# Patient Record
Sex: Female | Born: 1955 | Race: White | Hispanic: No | Marital: Married | Smoking: Never smoker
Health system: Southern US, Community
[De-identification: ages and names within clinical notes are randomized; demographics above are authoritative.]

## PROBLEM LIST (undated history)

## (undated) DIAGNOSIS — R7303 Prediabetes: Secondary | ICD-10-CM

## (undated) DIAGNOSIS — D649 Anemia, unspecified: Secondary | ICD-10-CM

## (undated) DIAGNOSIS — J45909 Unspecified asthma, uncomplicated: Secondary | ICD-10-CM

## (undated) DIAGNOSIS — Z9109 Other allergy status, other than to drugs and biological substances: Secondary | ICD-10-CM

## (undated) DIAGNOSIS — D1800 Hemangioma unspecified site: Secondary | ICD-10-CM

## (undated) DIAGNOSIS — T7840XA Allergy, unspecified, initial encounter: Secondary | ICD-10-CM

## (undated) DIAGNOSIS — E785 Hyperlipidemia, unspecified: Secondary | ICD-10-CM

## (undated) DIAGNOSIS — K449 Diaphragmatic hernia without obstruction or gangrene: Secondary | ICD-10-CM

## (undated) DIAGNOSIS — K219 Gastro-esophageal reflux disease without esophagitis: Secondary | ICD-10-CM

## (undated) DIAGNOSIS — J069 Acute upper respiratory infection, unspecified: Secondary | ICD-10-CM

## (undated) DIAGNOSIS — R42 Dizziness and giddiness: Secondary | ICD-10-CM

## (undated) DIAGNOSIS — I10 Essential (primary) hypertension: Secondary | ICD-10-CM

## (undated) DIAGNOSIS — M722 Plantar fascial fibromatosis: Secondary | ICD-10-CM

## (undated) DIAGNOSIS — M5126 Other intervertebral disc displacement, lumbar region: Secondary | ICD-10-CM

## (undated) HISTORY — DX: Other intervertebral disc displacement, lumbar region: M51.26

## (undated) HISTORY — DX: Gastro-esophageal reflux disease without esophagitis: K21.9

## (undated) HISTORY — DX: Dizziness and giddiness: R42

## (undated) HISTORY — DX: Hemangioma unspecified site: D18.00

## (undated) HISTORY — PX: CATARACT EXTRACTION: SUR2

## (undated) HISTORY — DX: Prediabetes: R73.03

## (undated) HISTORY — DX: Hyperlipidemia, unspecified: E78.5

## (undated) HISTORY — DX: Plantar fascial fibromatosis: M72.2

## (undated) HISTORY — PX: WISDOM TOOTH EXTRACTION: SHX21

## (undated) HISTORY — DX: Diaphragmatic hernia without obstruction or gangrene: K44.9

## (undated) HISTORY — DX: Acute upper respiratory infection, unspecified: J06.9

## (undated) HISTORY — DX: Essential (primary) hypertension: I10

## (undated) HISTORY — DX: Allergy, unspecified, initial encounter: T78.40XA

## (undated) HISTORY — DX: Anemia, unspecified: D64.9

---

## 1998-07-13 ENCOUNTER — Other Ambulatory Visit: Admission: RE | Admit: 1998-07-13 | Discharge: 1998-07-13 | Payer: Self-pay | Admitting: *Deleted

## 1998-09-16 ENCOUNTER — Other Ambulatory Visit: Admission: RE | Admit: 1998-09-16 | Discharge: 1998-09-16 | Payer: Self-pay | Admitting: *Deleted

## 1998-11-05 HISTORY — PX: ABDOMINAL HYSTERECTOMY: SHX81

## 1998-11-16 ENCOUNTER — Inpatient Hospital Stay (HOSPITAL_COMMUNITY): Admission: RE | Admit: 1998-11-16 | Discharge: 1998-11-18 | Payer: Self-pay | Admitting: *Deleted

## 1999-09-20 ENCOUNTER — Other Ambulatory Visit: Admission: RE | Admit: 1999-09-20 | Discharge: 1999-09-20 | Payer: Self-pay | Admitting: *Deleted

## 2000-10-24 ENCOUNTER — Other Ambulatory Visit: Admission: RE | Admit: 2000-10-24 | Discharge: 2000-10-24 | Payer: Self-pay | Admitting: *Deleted

## 2001-11-18 ENCOUNTER — Other Ambulatory Visit: Admission: RE | Admit: 2001-11-18 | Discharge: 2001-11-18 | Payer: Self-pay | Admitting: *Deleted

## 2001-11-20 ENCOUNTER — Ambulatory Visit (HOSPITAL_COMMUNITY): Admission: RE | Admit: 2001-11-20 | Discharge: 2001-11-20 | Payer: Self-pay | Admitting: Internal Medicine

## 2001-11-20 ENCOUNTER — Encounter: Payer: Self-pay | Admitting: Internal Medicine

## 2003-11-01 ENCOUNTER — Other Ambulatory Visit: Admission: RE | Admit: 2003-11-01 | Discharge: 2003-11-01 | Payer: Self-pay | Admitting: *Deleted

## 2004-12-04 ENCOUNTER — Other Ambulatory Visit: Admission: RE | Admit: 2004-12-04 | Discharge: 2004-12-04 | Payer: Self-pay | Admitting: *Deleted

## 2005-09-06 ENCOUNTER — Ambulatory Visit (HOSPITAL_COMMUNITY): Admission: RE | Admit: 2005-09-06 | Discharge: 2005-09-06 | Payer: Self-pay | Admitting: Internal Medicine

## 2007-02-17 ENCOUNTER — Other Ambulatory Visit: Admission: RE | Admit: 2007-02-17 | Discharge: 2007-02-17 | Payer: Self-pay | Admitting: *Deleted

## 2007-03-11 ENCOUNTER — Ambulatory Visit: Payer: Self-pay | Admitting: Gastroenterology

## 2007-03-21 ENCOUNTER — Ambulatory Visit: Payer: Self-pay | Admitting: Gastroenterology

## 2008-05-06 ENCOUNTER — Other Ambulatory Visit: Admission: RE | Admit: 2008-05-06 | Discharge: 2008-05-06 | Payer: Self-pay | Admitting: Internal Medicine

## 2009-01-15 ENCOUNTER — Emergency Department (HOSPITAL_BASED_OUTPATIENT_CLINIC_OR_DEPARTMENT_OTHER): Admission: EM | Admit: 2009-01-15 | Discharge: 2009-01-15 | Payer: Self-pay | Admitting: Emergency Medicine

## 2009-04-25 ENCOUNTER — Ambulatory Visit (HOSPITAL_COMMUNITY): Admission: RE | Admit: 2009-04-25 | Discharge: 2009-04-25 | Payer: Self-pay | Admitting: Internal Medicine

## 2009-04-25 IMAGING — CR DG LUMBAR SPINE COMPLETE 4+V
6 series · 6 of 6 positions shown · non-contrast
Comparison: None

CLINICAL DATA: Low back pain, no trauma

LUMBAR SPINE - COMPLETE 4+ VIEW

[view not recorded (1 of 6)]
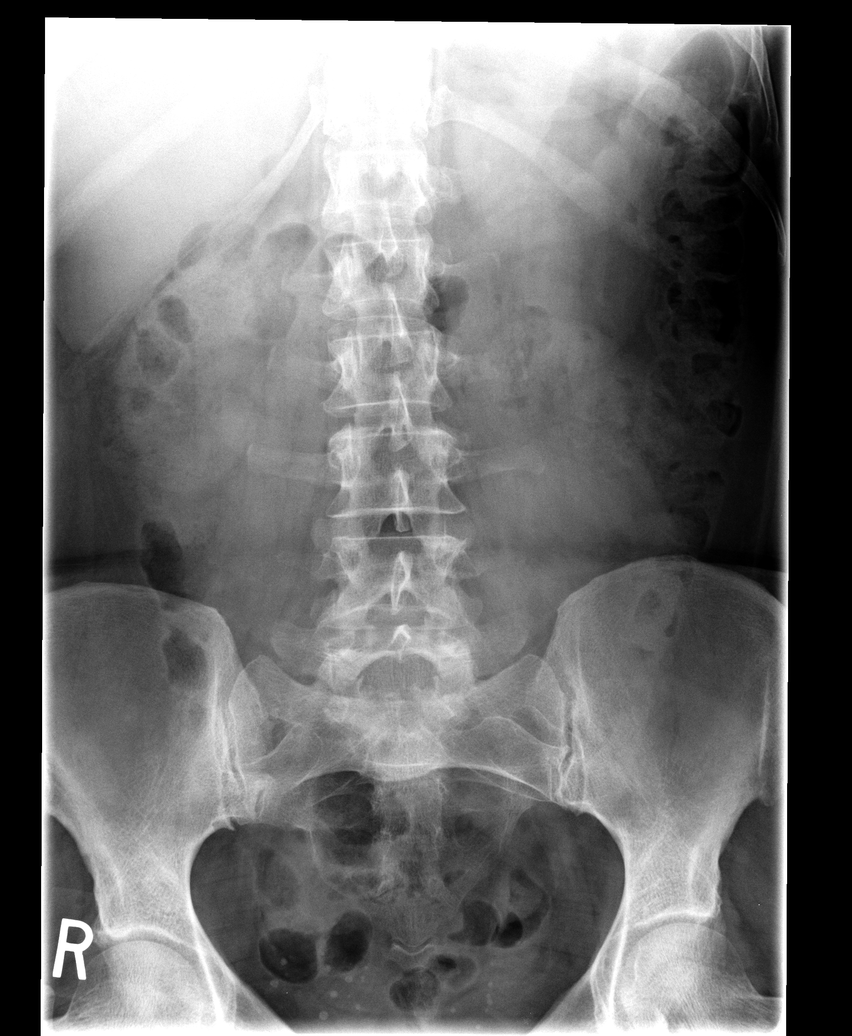

[view not recorded (2 of 6)]
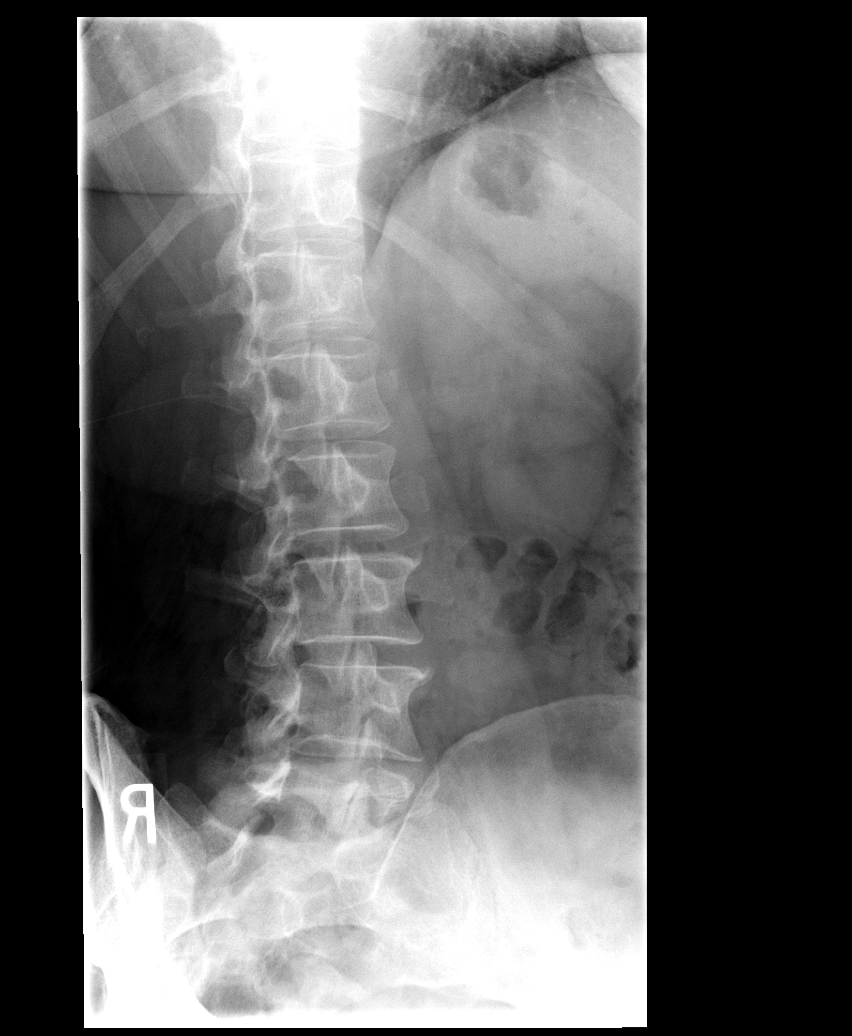

[view not recorded (3 of 6)]
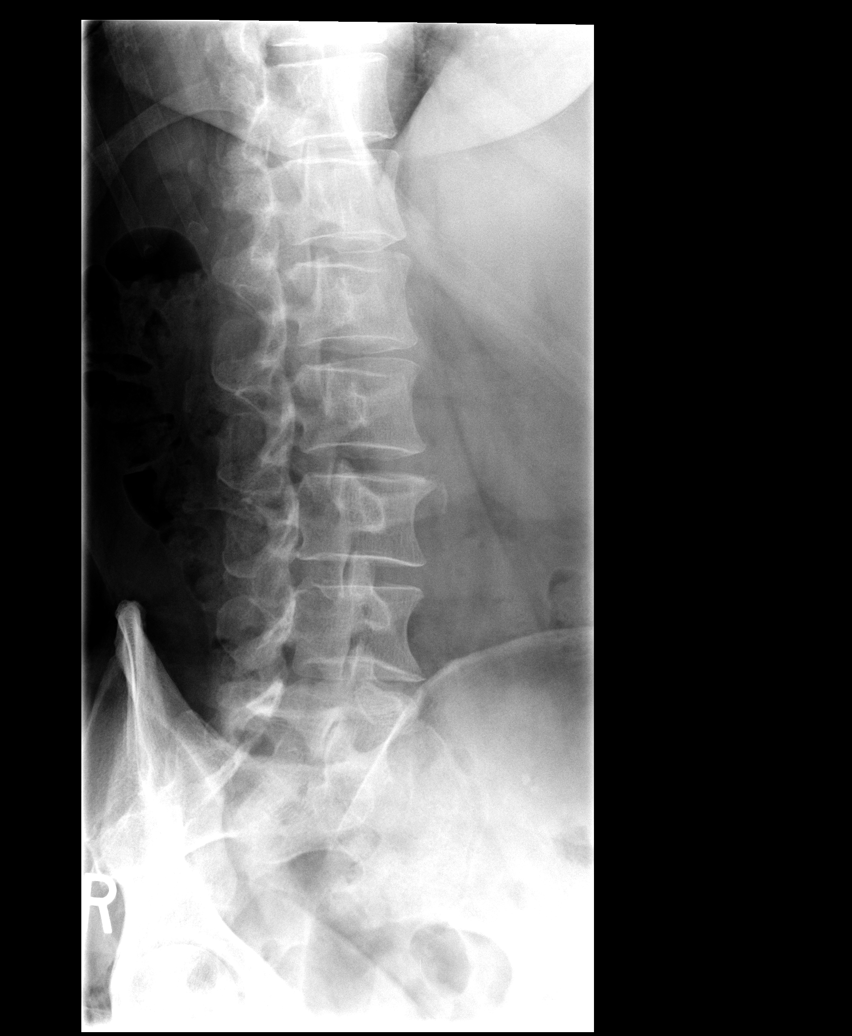

[view not recorded (4 of 6)]
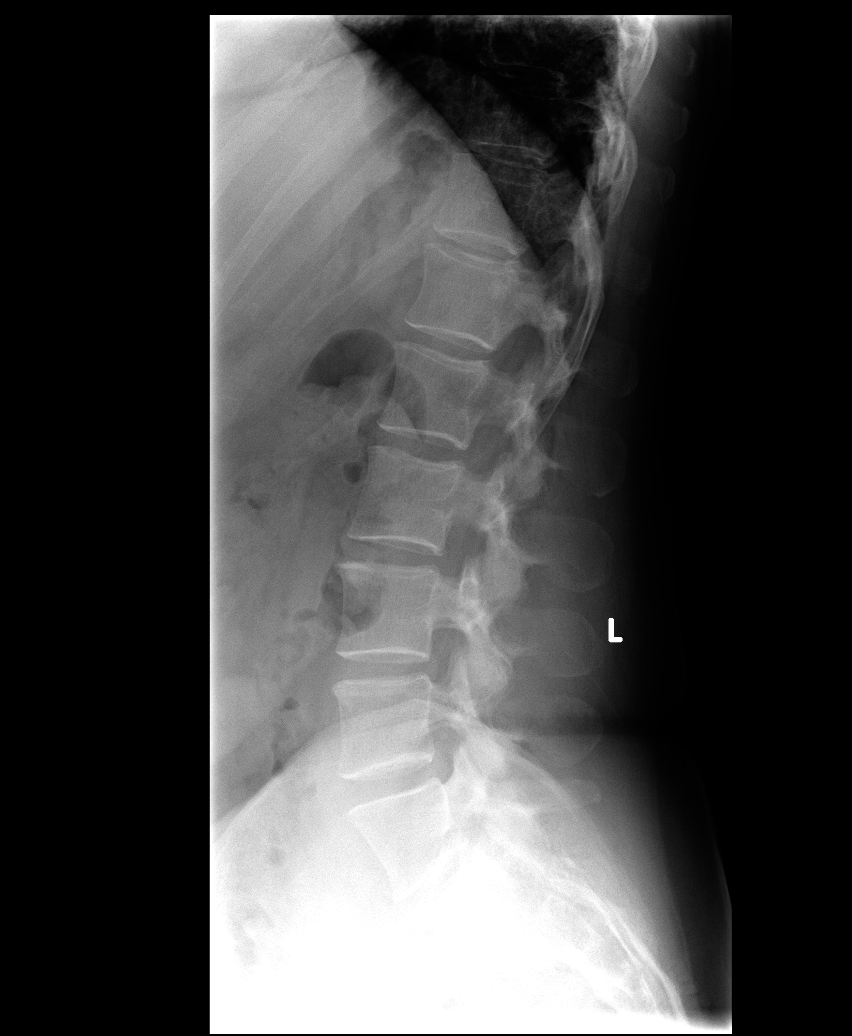

[view not recorded (5 of 6)]
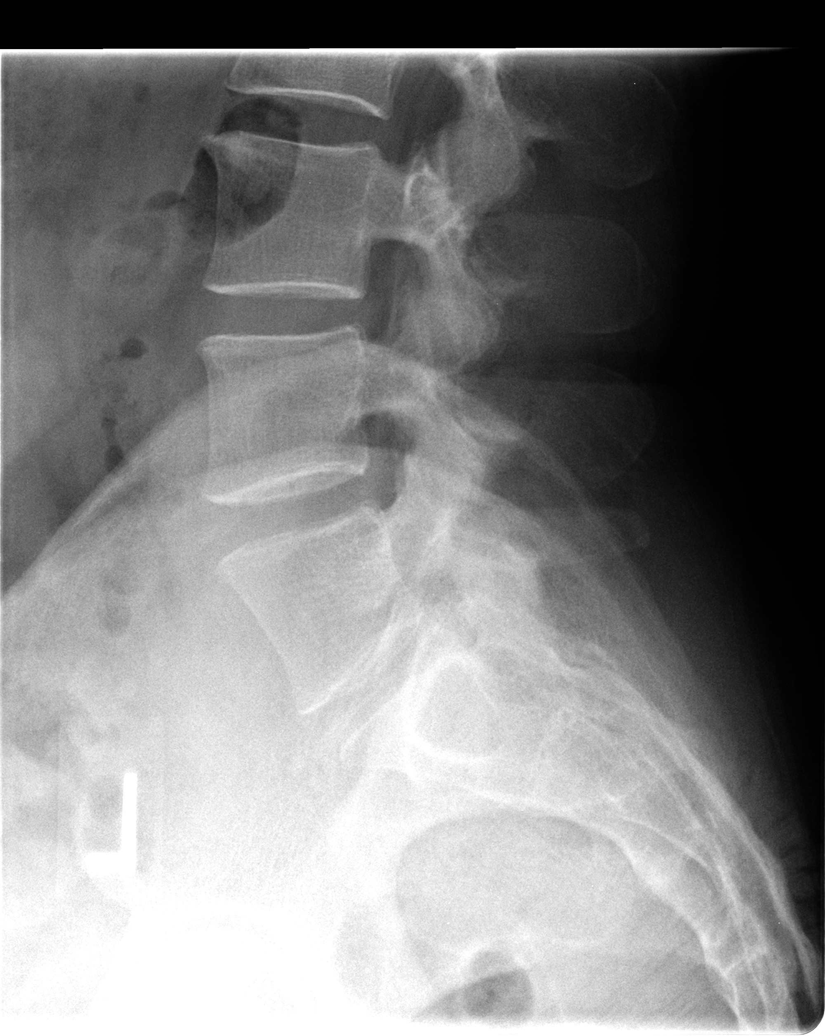

[view not recorded (6 of 6)]
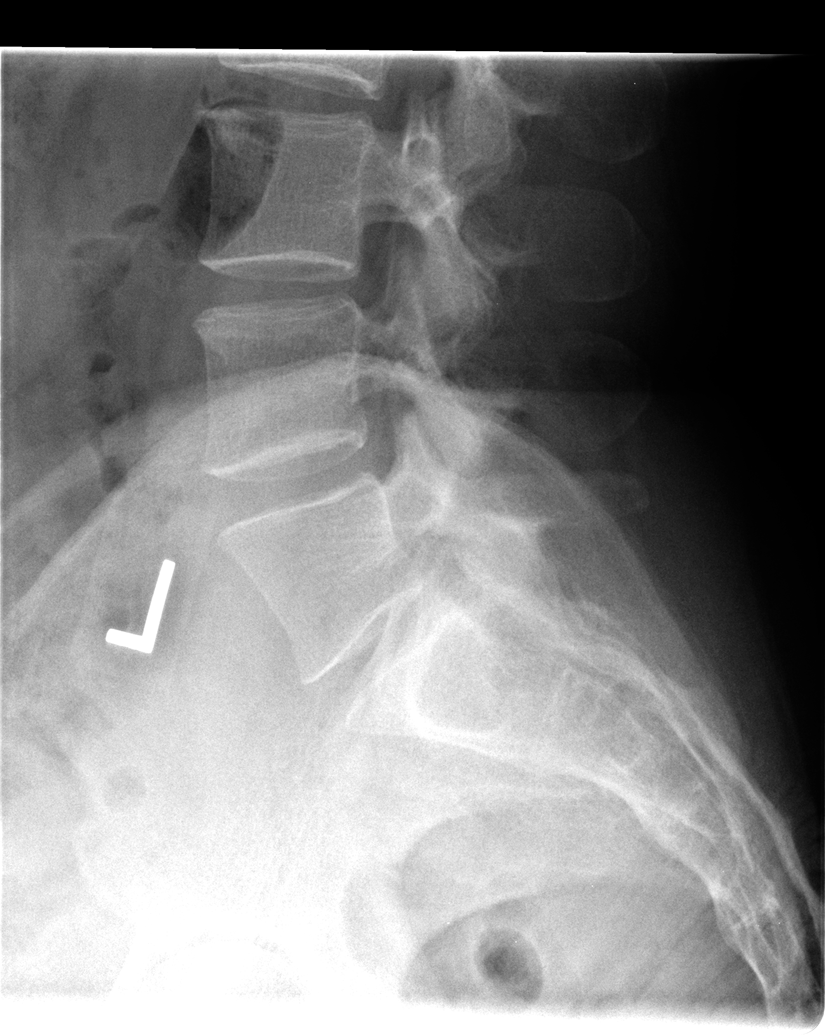

[6 of 6 positions shown; findings below may reference images not displayed]

FINDINGS: There is no evidence of lumbar spine fracture.  Alignment
is normal.  Intervertebral disc spaces are maintained.Mild
degenerative facet joint changes are noted at L5- S1. Lumbarization
of S1 noted.
IMPRESSION: Mild degenerative facet joint changes at L5-S1.  No acute finding.

## 2009-06-09 ENCOUNTER — Other Ambulatory Visit: Admission: RE | Admit: 2009-06-09 | Discharge: 2009-06-09 | Payer: Self-pay | Admitting: Internal Medicine

## 2009-07-14 ENCOUNTER — Encounter: Payer: Self-pay | Admitting: Cardiology

## 2009-07-15 ENCOUNTER — Ambulatory Visit: Payer: Self-pay | Admitting: Cardiology

## 2009-08-25 ENCOUNTER — Ambulatory Visit: Payer: Self-pay

## 2009-08-25 ENCOUNTER — Ambulatory Visit: Payer: Self-pay | Admitting: Cardiology

## 2009-08-25 ENCOUNTER — Ambulatory Visit (HOSPITAL_COMMUNITY): Admission: RE | Admit: 2009-08-25 | Discharge: 2009-08-25 | Payer: Self-pay | Admitting: Cardiology

## 2009-08-25 ENCOUNTER — Encounter: Payer: Self-pay | Admitting: Cardiology

## 2009-08-30 ENCOUNTER — Telehealth: Payer: Self-pay | Admitting: Cardiology

## 2010-12-05 NOTE — Letter (Signed)
Summary: [facility]  [facility]   Imported By: Kassie Mends 11/07/2009 11:23:53  _____________________________________________________________________  External Attachment:    Type:   Image     Comment:   External Document

## 2010-12-05 NOTE — Progress Notes (Signed)
Summary: TEST RESULTS   Phone Note Call from Patient Call back at Home Phone [phone]   Caller: Patient Reason for Call: Lab or Test Results Initial call taken by: Judie Grieve,  August 30, 2009 9:50 AM  Follow-up for Phone Call        pt aware and will have BP followed up with Dr Astrid Divine Dennis Bast, RN, BSN  August 30, 2009 10:33 AM

## 2011-02-15 LAB — COMPREHENSIVE METABOLIC PANEL
ALT: 24 U/L (ref 0–35)
AST: 23 U/L (ref 0–37)
Albumin: 5 g/dL (ref 3.5–5.2)
Alkaline Phosphatase: 114 U/L (ref 39–117)
BUN: 17 mg/dL (ref 6–23)
CO2: 23 mEq/L (ref 19–32)
Calcium: 10.1 mg/dL (ref 8.4–10.5)
Chloride: 103 mEq/L (ref 96–112)
Creatinine, Ser: 0.7 mg/dL (ref 0.4–1.2)
GFR calc Af Amer: >60 mL/min (ref >60)
GFR calc non Af Amer: >60 mL/min (ref >60)
Glucose, Bld: 120 mg/dL — ABNORMAL HIGH (ref 70–99)
Potassium: 4.3 mEq/L (ref 3.5–5.1)
Sodium: 142 mEq/L (ref 135–145)
Total Bilirubin: 0.6 mg/dL (ref 0.3–1.2)
Total Protein: 8.8 g/dL — ABNORMAL HIGH (ref 6.0–8.3)

## 2011-02-15 LAB — URINE MICROSCOPIC-ADD ON

## 2011-02-15 LAB — URINALYSIS, ROUTINE W REFLEX MICROSCOPIC
Bilirubin Urine: NEGATIVE
Ketones, ur: NEGATIVE mg/dL
Nitrite: NEGATIVE
Specific Gravity, Urine: 1.024 (ref 1.005–1.030)
Urobilinogen, UA: 0.2 mg/dL (ref 0.0–1.0)

## 2011-02-15 LAB — CBC
HCT: 47.4 % — ABNORMAL HIGH (ref 36.0–46.0)
Hemoglobin: 16.4 g/dL — ABNORMAL HIGH (ref 12.0–15.0)
MCHC: 34.5 g/dL (ref 30.0–36.0)
MCV: 92.4 fL (ref 78.0–100.0)
Platelets: 264 10*3/uL (ref 150–400)
RBC: 5.13 MIL/uL — ABNORMAL HIGH (ref 3.87–5.11)
RDW: 11.9 % (ref 11.5–15.5)
WBC: 13.7 10*3/uL — ABNORMAL HIGH (ref 4.0–10.5)

## 2011-02-15 LAB — DIFFERENTIAL
Basophils Absolute: 0.4 10*3/uL — ABNORMAL HIGH (ref 0.0–0.1)
Basophils Relative: 3 % — ABNORMAL HIGH (ref 0–1)
Eosinophils Relative: 0 % (ref 0–5)
Monocytes Absolute: 0.5 10*3/uL (ref 0.1–1.0)
Neutro Abs: 12.1 10*3/uL — ABNORMAL HIGH (ref 1.7–7.7)

## 2011-02-15 LAB — URINE CULTURE

## 2011-06-20 ENCOUNTER — Emergency Department (HOSPITAL_COMMUNITY)
Admission: EM | Admit: 2011-06-20 | Discharge: 2011-06-20 | Disposition: A | Discharge status: Home / Self Care | Payer: Self-pay | Attending: Emergency Medicine | Admitting: Emergency Medicine

## 2011-06-21 ENCOUNTER — Other Ambulatory Visit: Payer: Self-pay | Admitting: Internal Medicine

## 2011-06-22 ENCOUNTER — Other Ambulatory Visit: Payer: Self-pay

## 2011-06-25 ENCOUNTER — Ambulatory Visit
Admission: RE | Admit: 2011-06-25 | Discharge: 2011-06-25 | Disposition: A | Discharge status: Home / Self Care | Payer: BC Managed Care – PPO | Source: Ambulatory Visit | Attending: Internal Medicine | Admitting: Internal Medicine

## 2011-06-25 IMAGING — CT CT ABD-PELV W/ CM
2 of 5 series · 17 of 46 positions shown, 19 images · IV contrast (READICAT/WATER & [ID] OMNI 300)
Comparison: None.

CLINICAL DATA: Abdominal pain and distention.  Question small bowel
obstruction or ascites.  Prior hysterectomy with bilateral
oophorectomy.

CT ABDOMEN AND PELVIS WITH CONTRAST
TECHNIQUE: Multidetector CT imaging of the abdomen and pelvis was
performed following the standard protocol during bolus
administration of intravenous contrast.
Contrast: 100 ml [8M]

[Series 2: abdomen w/ · axial · 0.70mm/px · z∈[-405,-25]mm · 14 of 84 slices shown, 16 images]
[im 5/84  soft-tissue]
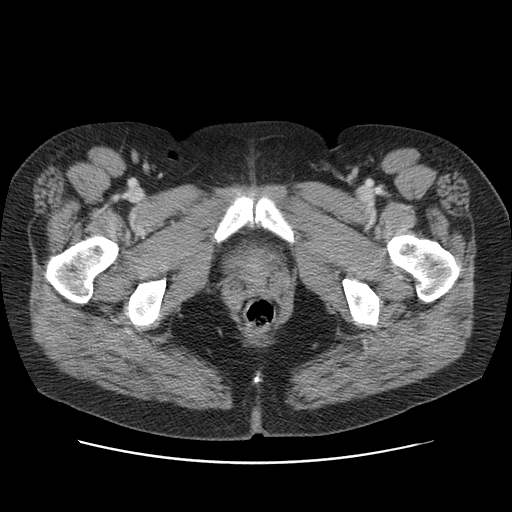
[im 5/84  bone]
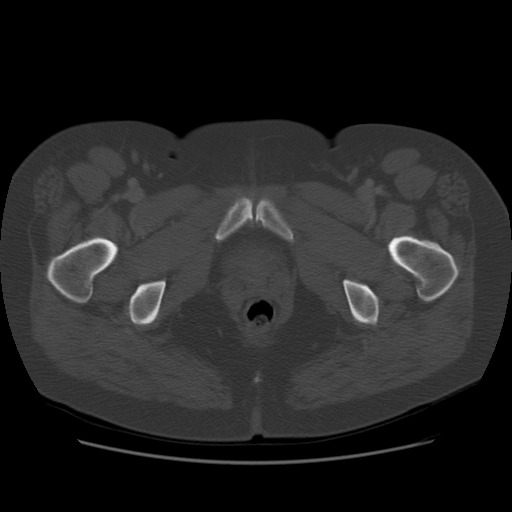
[im 9/84  soft-tissue]
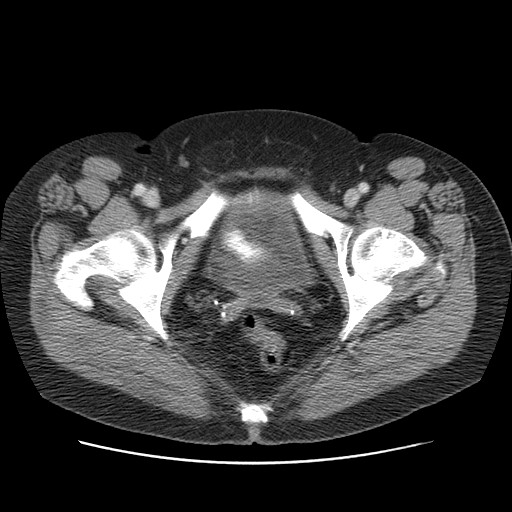
[im 18/84  soft-tissue]
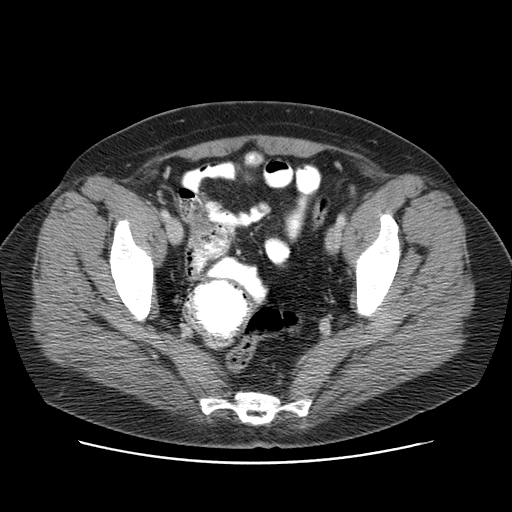
[im 22/84  soft-tissue]
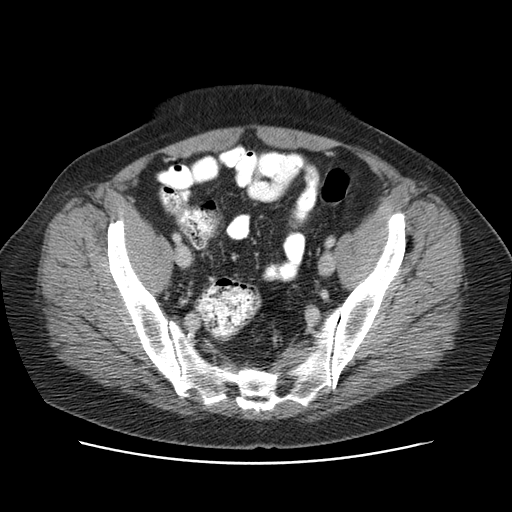
[im 27/84  soft-tissue]
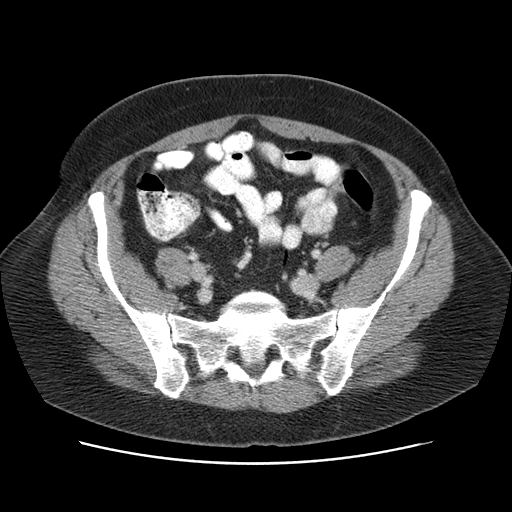
[im 35/84  soft-tissue]
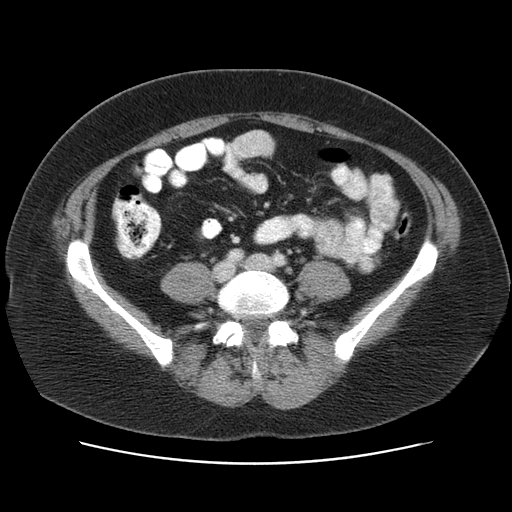
[im 40/84  soft-tissue]
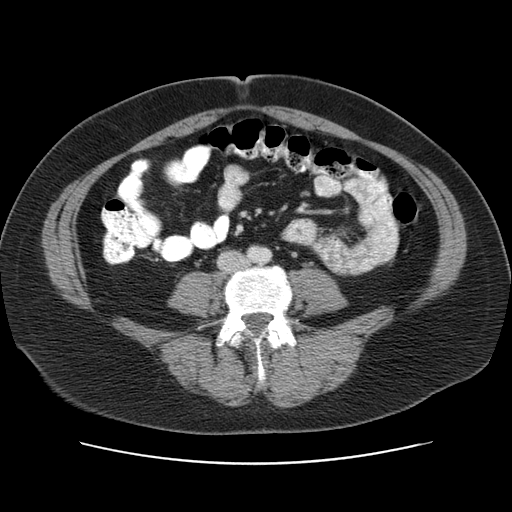
[im 44/84  soft-tissue]
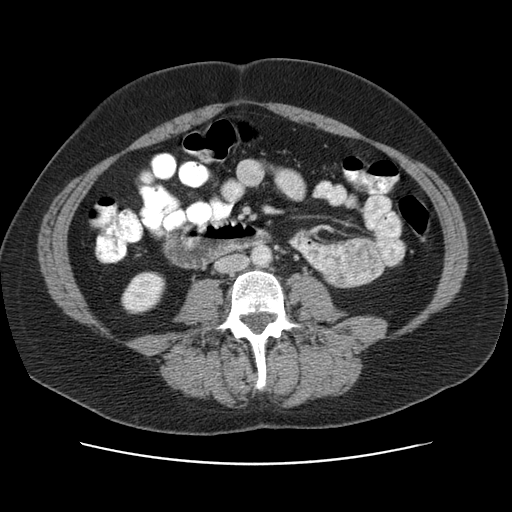
[im 49/84  soft-tissue]
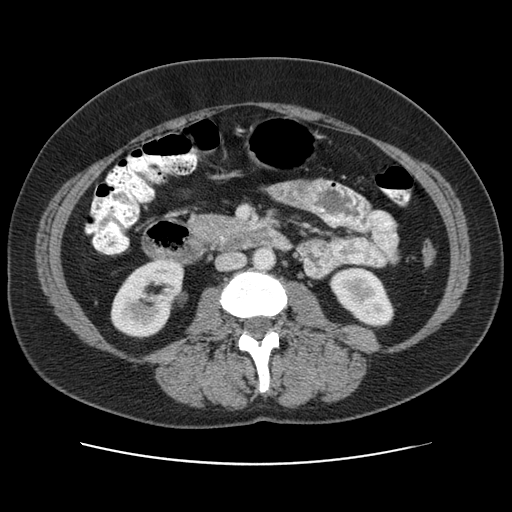
[im 49/84  bone]
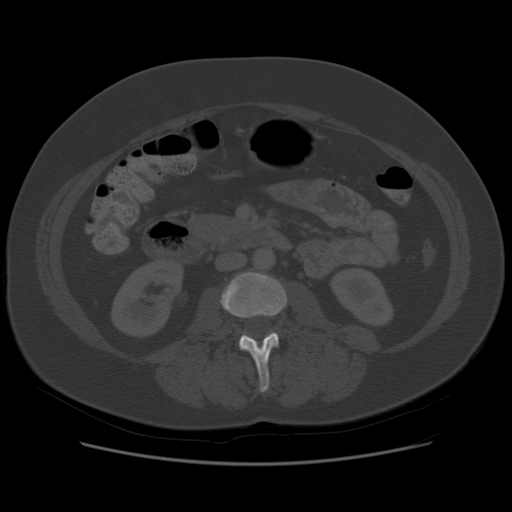
[im 57/84  soft-tissue]
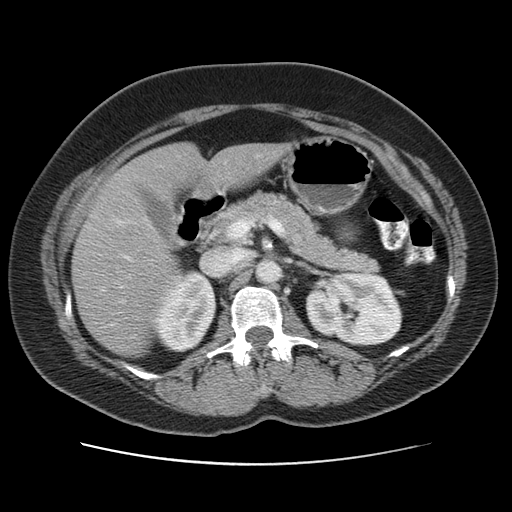
[im 62/84  soft-tissue]
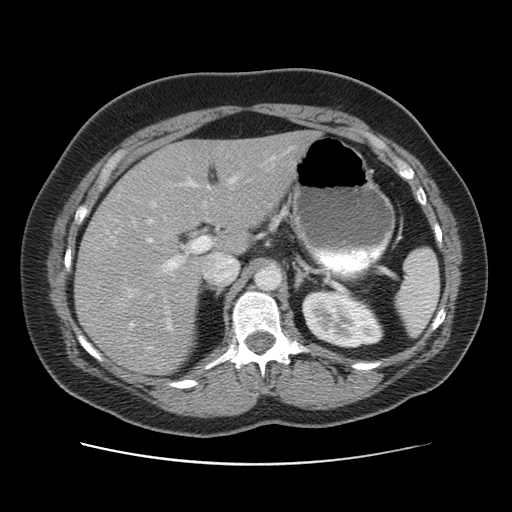
[im 66/84  soft-tissue]
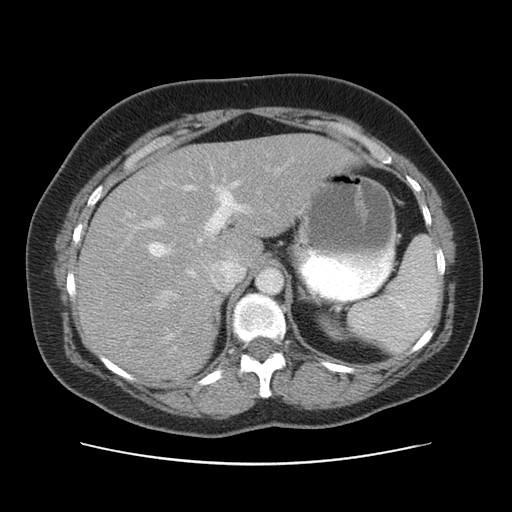
[im 75/84  soft-tissue]
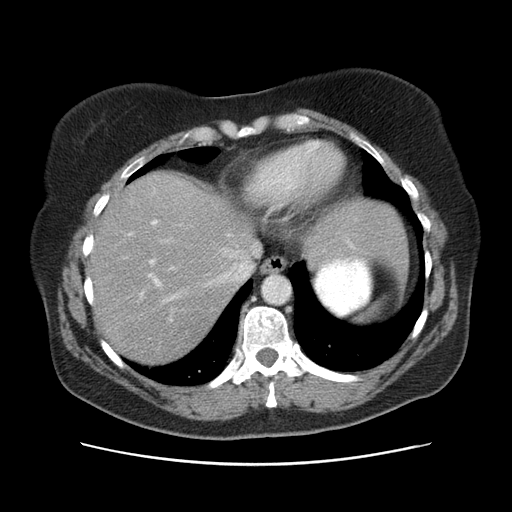
[im 79/84  soft-tissue]
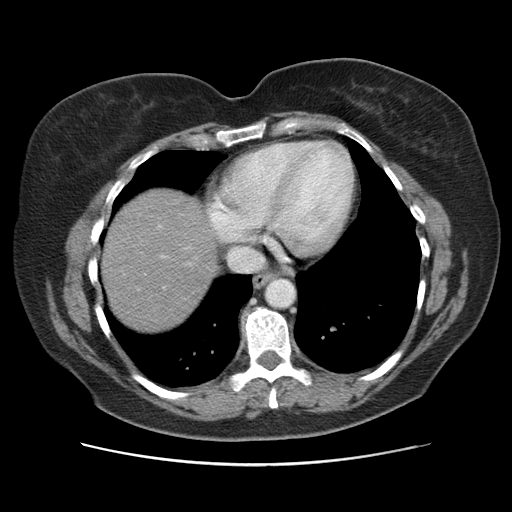

[Series 400: cor · coronal · 0.92mm/px · 3 of 115 slices shown]
[im 39/115  soft-tissue]
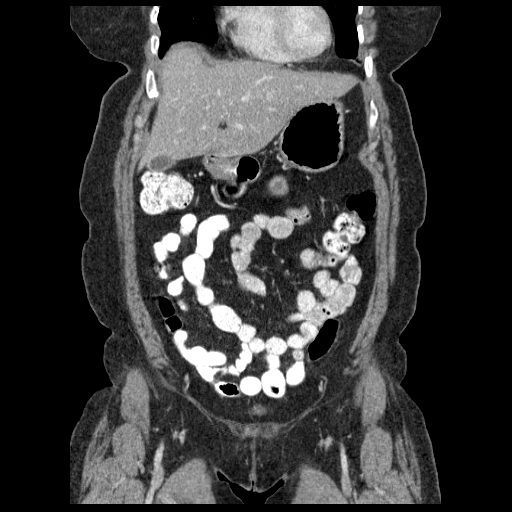
[im 51/115  soft-tissue]
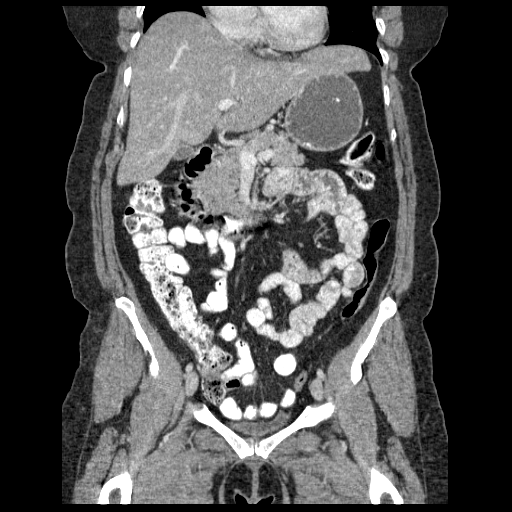
[im 64/115  soft-tissue]
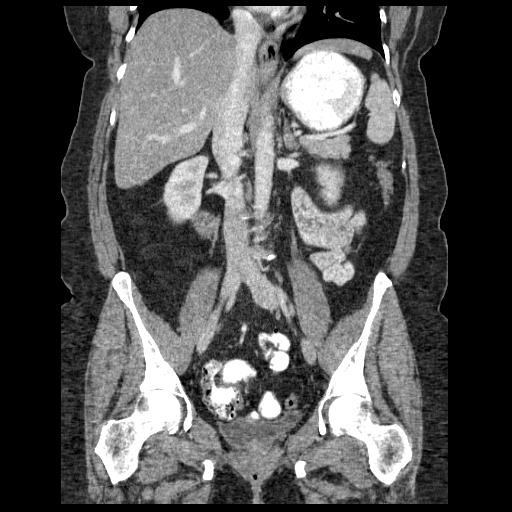

[17 of 46 positions shown; findings below may reference images not displayed]

FINDINGS: The liver and spleen have normal imaging features.  The
stomach, duodenum, pancreas, gallbladder, adrenal glands, and
kidneys have normal imaging features.

No abdominal aortic aneurysm.  There is no lymphadenopathy in the
abdomen.  No free fluid in the abdomen.  Abdominal bowel loops have
normal imaging features.

Imaging through the pelvis shows no intraperitoneal free fluid.
Uterus is surgically absent.  There is no adnexal mass.  No pelvic
sidewall lymphadenopathy.

Bladder is decompressed.  No substantial diverticular disease in
the colon.  No diverticulitis.  The terminal ileum is normal.  The
appendix is normal.

Bone windows reveal no worrisome lytic or sclerotic osseous
lesions.
IMPRESSION: No acute findings in the abdomen or pelvis.  Specifically, there is
no evidence for ascites.  No bowel dilatation to suggest
obstruction.

## 2011-06-25 MED ORDER — IOHEXOL 300 MG/ML  SOLN
100.0000 mL | Freq: Once | INTRAMUSCULAR | Status: AC | PRN
  Administered 2011-06-25: 100 mL via INTRAVENOUS

## 2012-01-24 ENCOUNTER — Other Ambulatory Visit (HOSPITAL_COMMUNITY): Payer: Self-pay | Admitting: Internal Medicine

## 2012-01-24 ENCOUNTER — Ambulatory Visit (HOSPITAL_COMMUNITY)
Admission: RE | Admit: 2012-01-24 | Discharge: 2012-01-24 | Disposition: A | Discharge status: Home / Self Care | Payer: BC Managed Care – PPO | Source: Ambulatory Visit | Attending: Internal Medicine | Admitting: Internal Medicine

## 2012-01-24 DIAGNOSIS — M79609 Pain in unspecified limb: Secondary | ICD-10-CM | POA: Insufficient documentation

## 2012-01-24 IMAGING — CR DG ANKLE COMPLETE 3+V*L*
3 series · 3 of 3 positions shown · non-contrast
Comparison: Concurrent foot evaluation

CLINICAL DATA: Pain

LEFT ANKLE COMPLETE - 3+ VIEW

[view not recorded (1 of 3)]
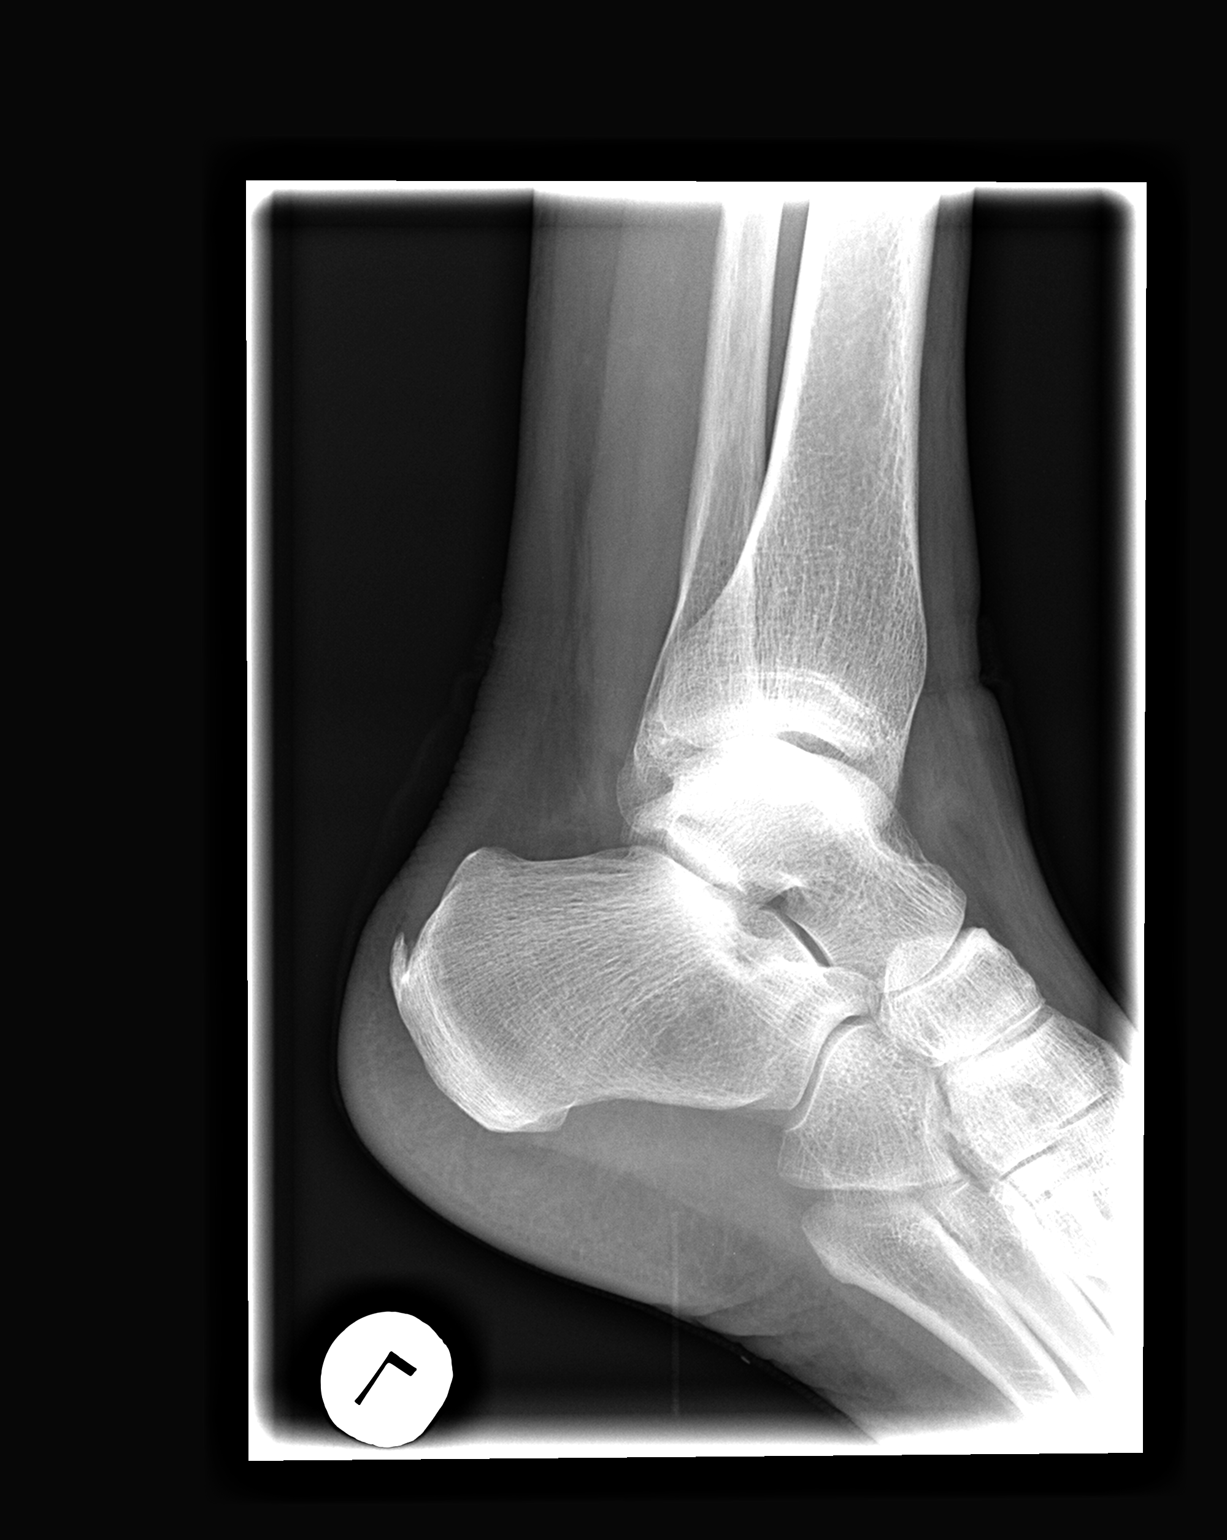

[view not recorded (2 of 3)]
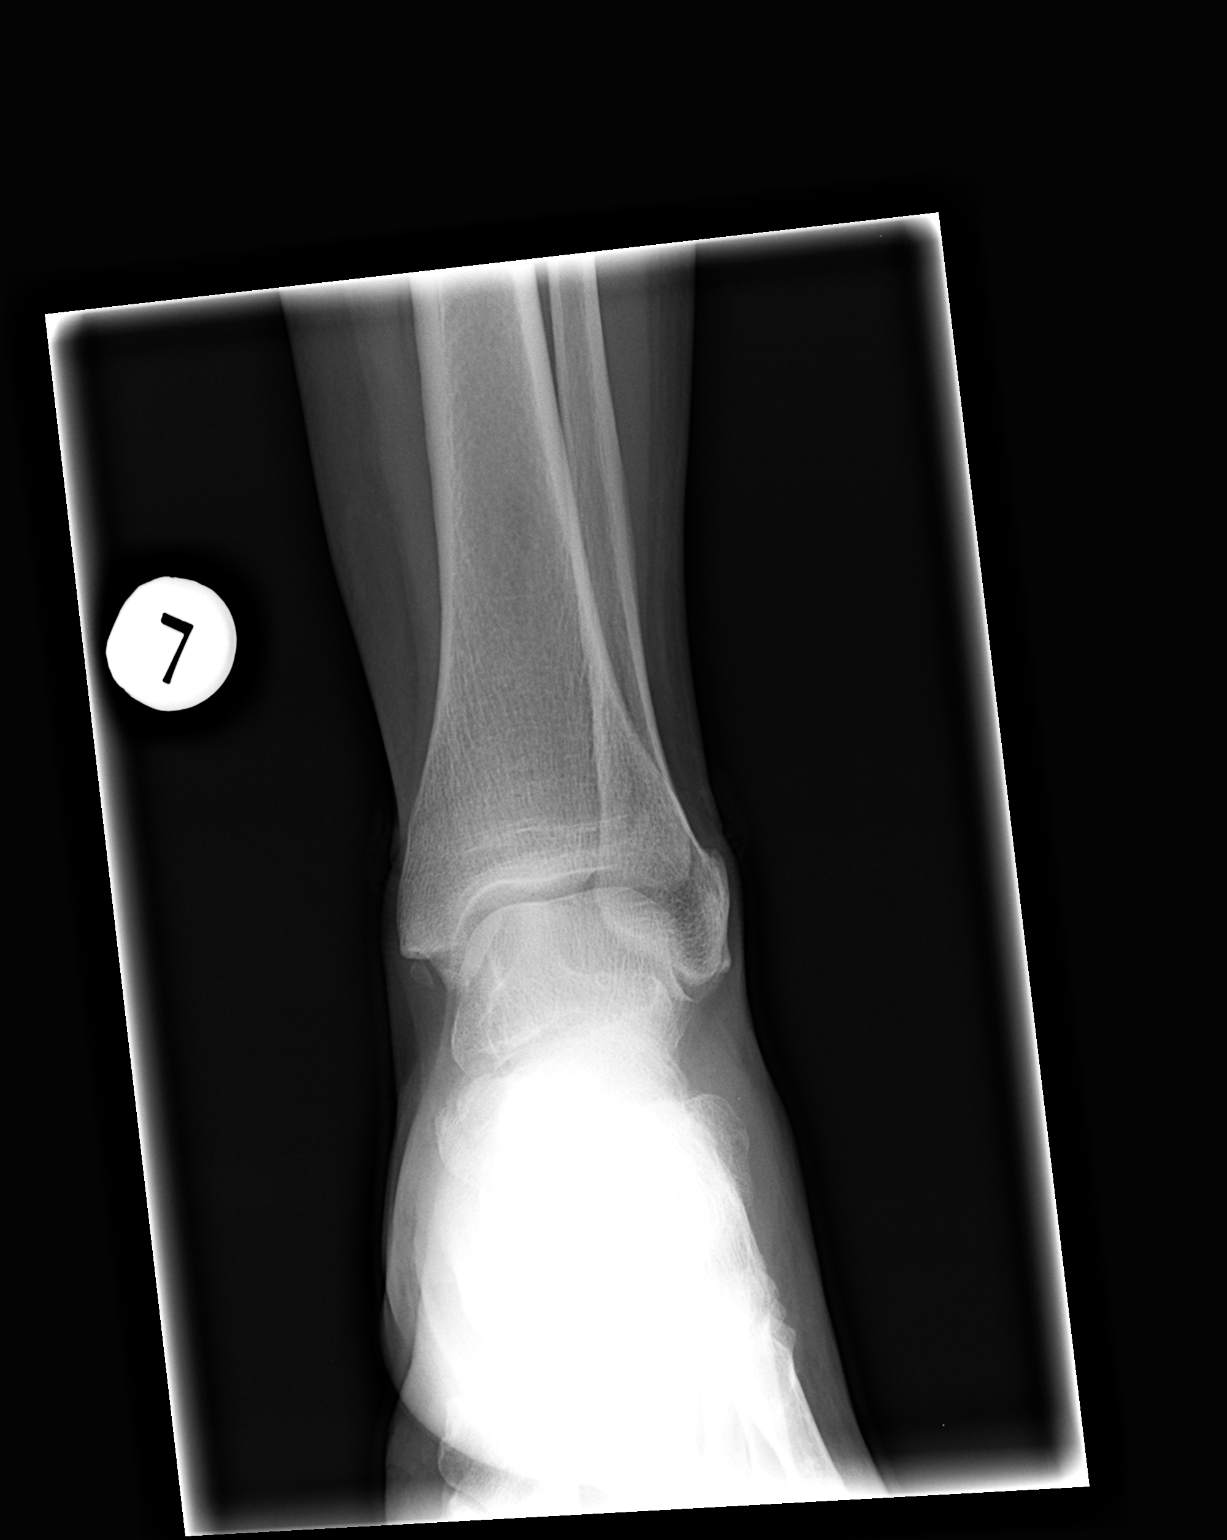

[view not recorded (3 of 3)]
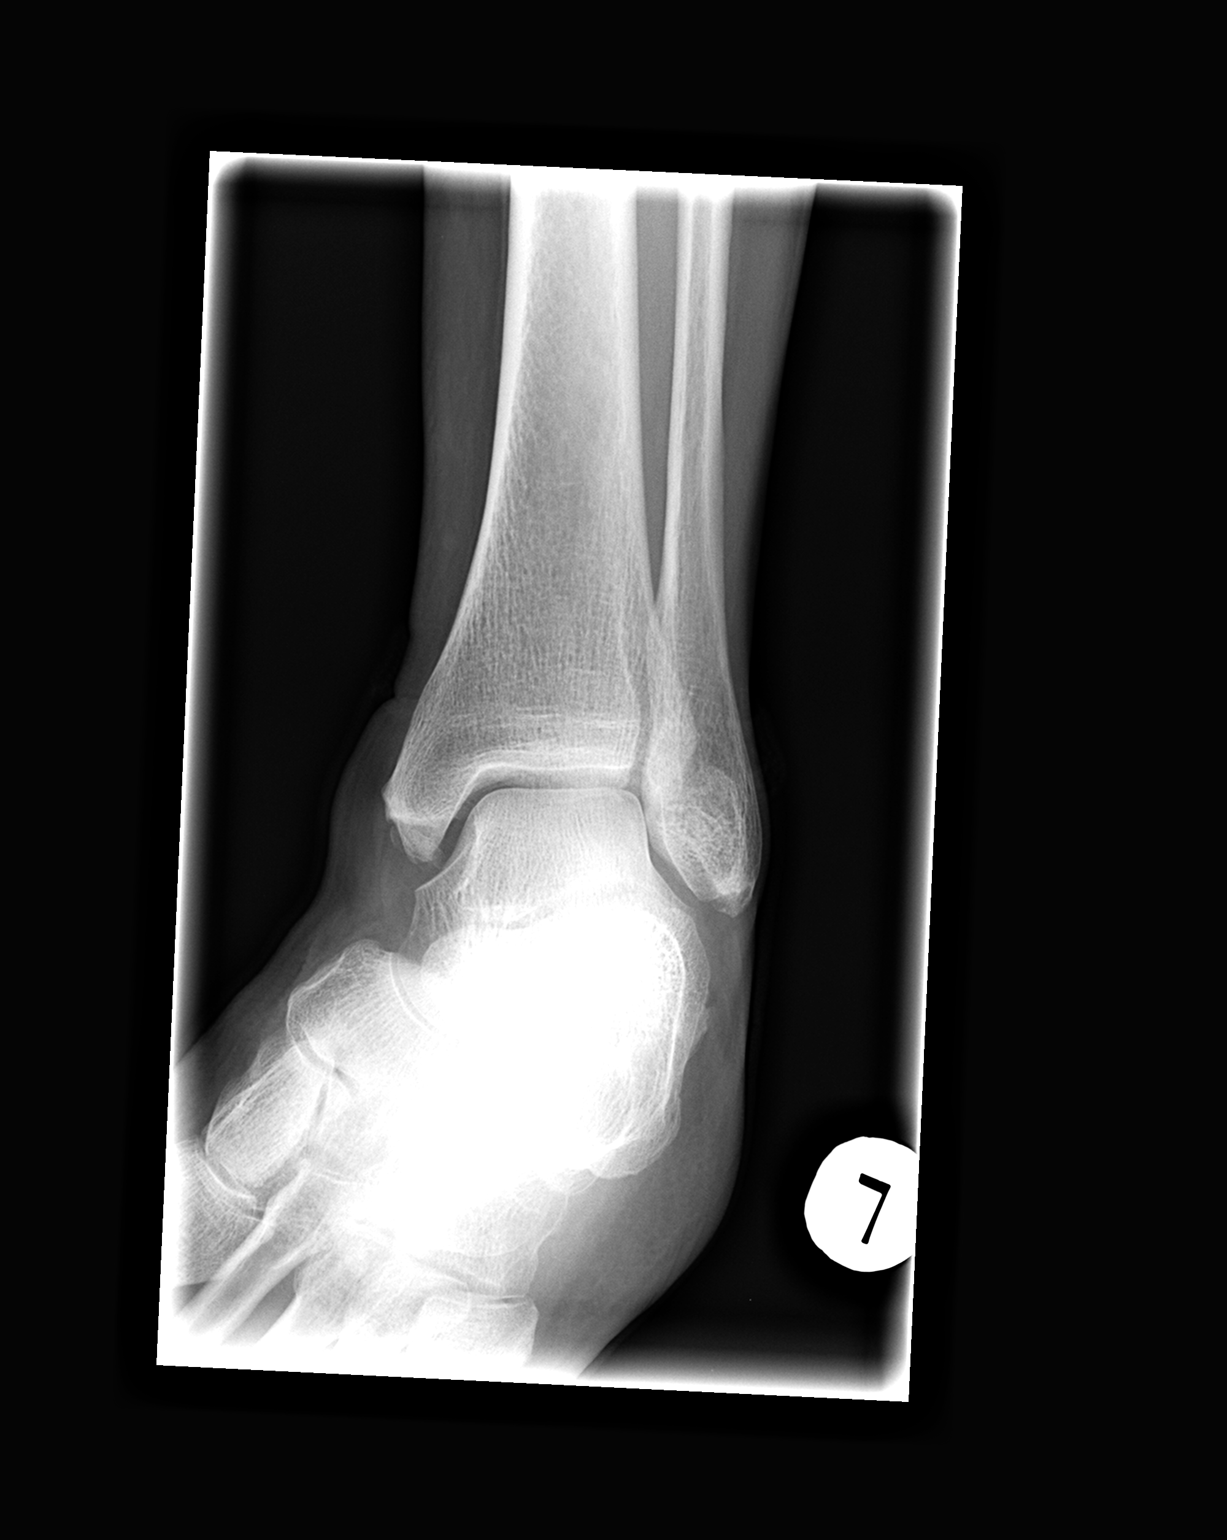

[3 of 3 positions shown; findings below may reference images not displayed]

FINDINGS: Bone density is within normal limits.  The mortise
appears maintained.  No evidence for acute fracture or dislocation
is seen.  No joint effusion or focal soft tissue abnormality is
seen.
IMPRESSION: Negative

## 2012-01-24 IMAGING — CR DG FOOT COMPLETE 3+V*L*
3 series · 3 of 3 positions shown · non-contrast
Comparison: None.

CLINICAL DATA: Pain in the anterior lateral metatarsal area up into
the ankle.  No trauma

LEFT FOOT - COMPLETE 3+ VIEW

[view not recorded (1 of 3)]
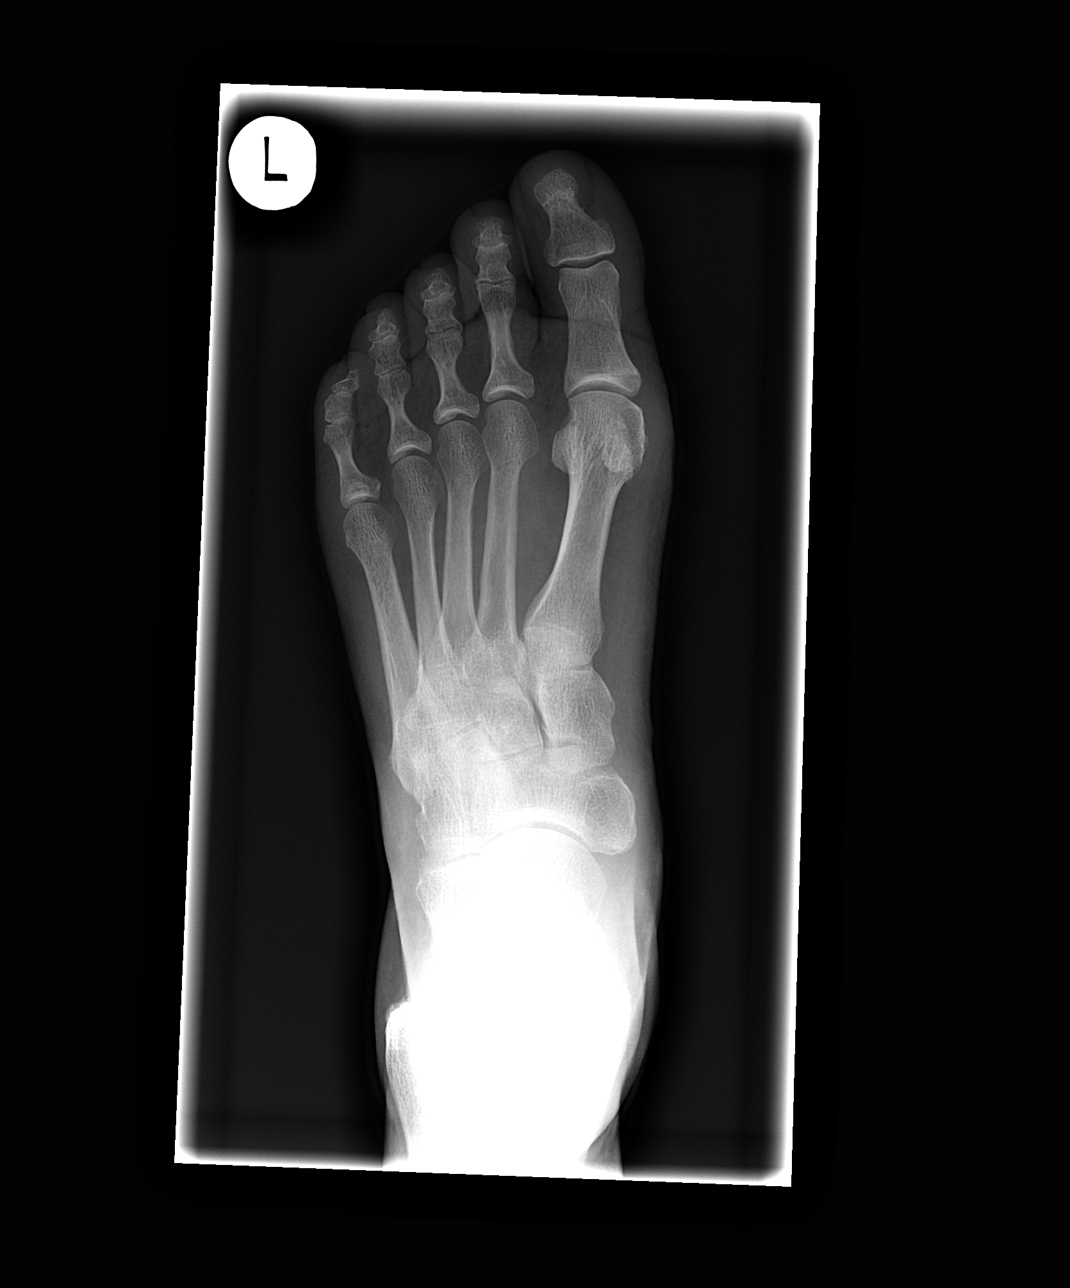

[view not recorded (2 of 3)]
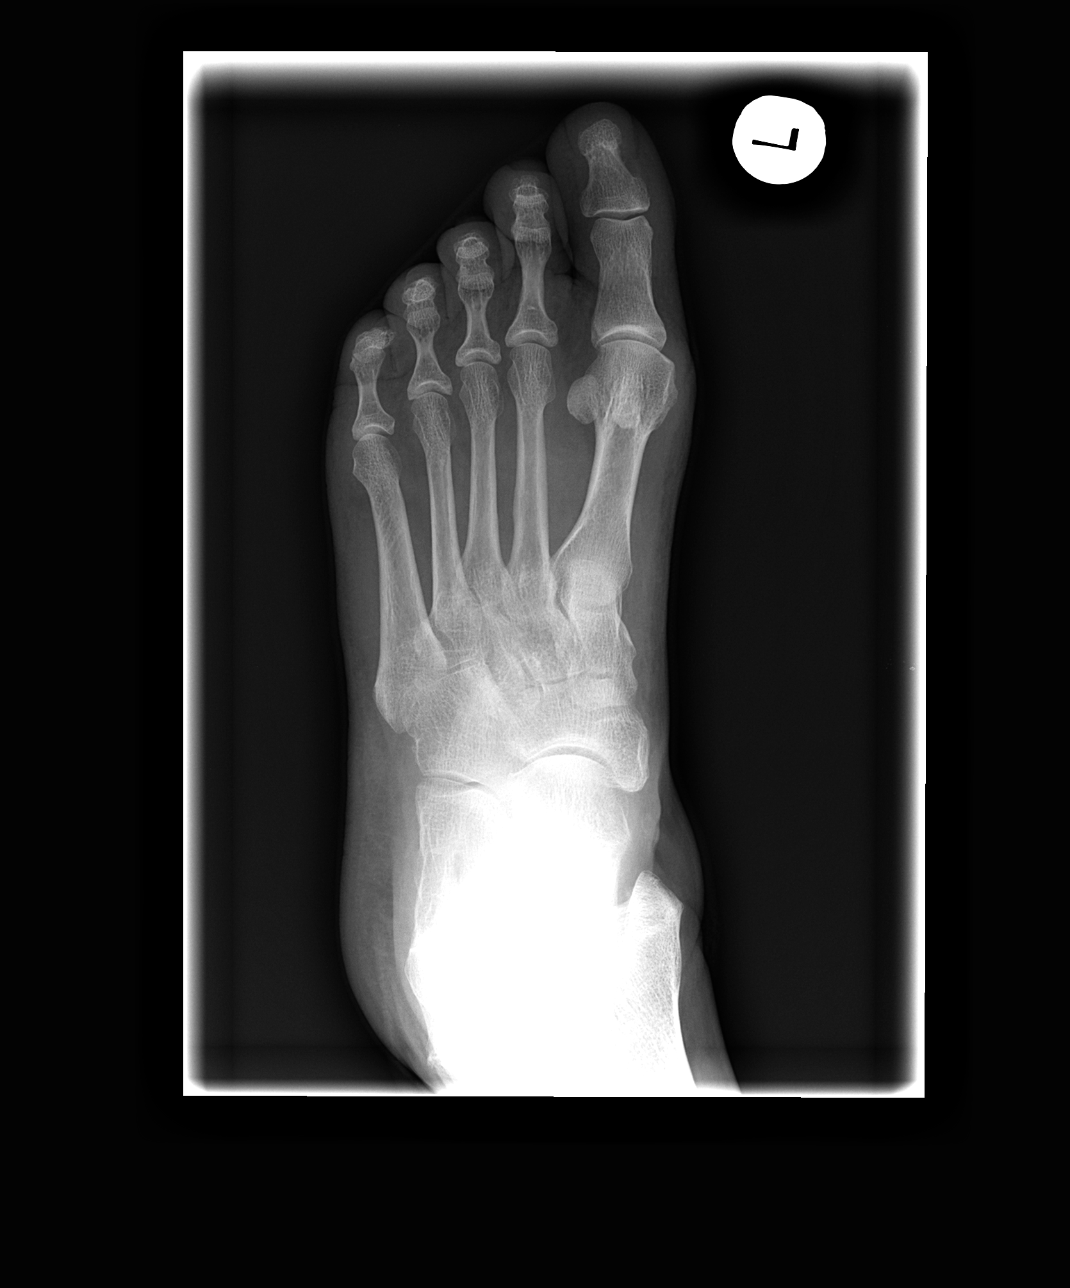

[view not recorded (3 of 3)]
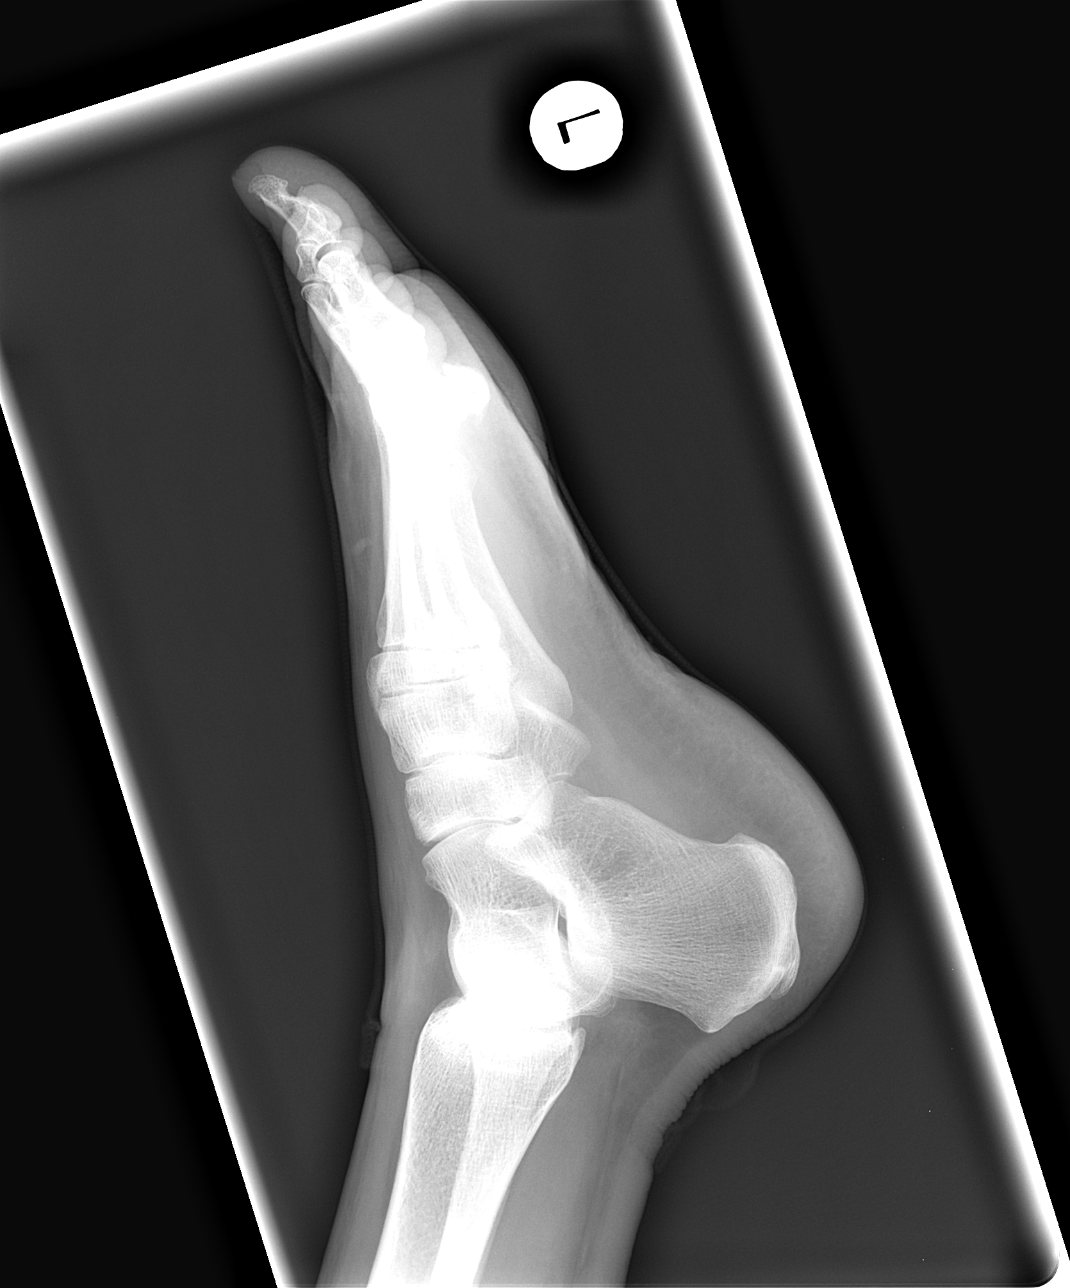

[3 of 3 positions shown; findings below may reference images not displayed]

FINDINGS: Bone density is within normal limits for age.  No
evidence for acute fracture or dislocation is seen.  No increase in
density or periosteal reaction is identified in the region of the
metatarsals to suggest the presence of a stress fracture.  No joint
effusion is seen.  Some spurring at the insertion of the Achilles
tendon is noted.  No focal soft tissue abnormality is seen.
IMPRESSION: Negative

## 2013-06-22 ENCOUNTER — Other Ambulatory Visit (HOSPITAL_COMMUNITY): Payer: Self-pay | Admitting: Internal Medicine

## 2013-06-22 ENCOUNTER — Ambulatory Visit (HOSPITAL_COMMUNITY)
Admission: RE | Admit: 2013-06-22 | Discharge: 2013-06-22 | Disposition: A | Discharge status: Home / Self Care | Payer: Commercial Indemnity | Source: Ambulatory Visit | Attending: Internal Medicine | Admitting: Internal Medicine

## 2013-06-22 DIAGNOSIS — R52 Pain, unspecified: Secondary | ICD-10-CM

## 2013-06-22 DIAGNOSIS — M545 Low back pain, unspecified: Secondary | ICD-10-CM | POA: Insufficient documentation

## 2013-06-22 IMAGING — CR DG LUMBAR SPINE COMPLETE 4+V
5 series · 5 of 5 positions shown · non-contrast
Comparison: [DATE] and

CLINICAL DATA: Low back pain.

LUMBAR SPINE - COMPLETE 4+ VIEW

[view not recorded (1 of 5)]
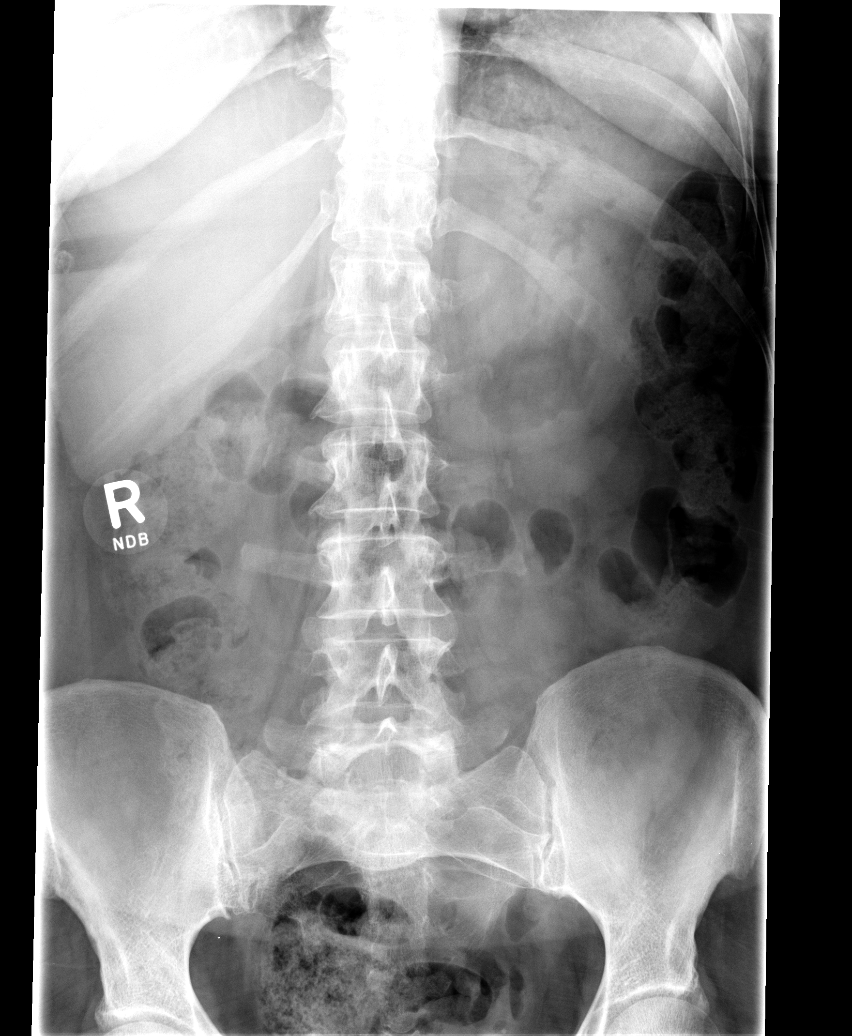

[view not recorded (2 of 5)]
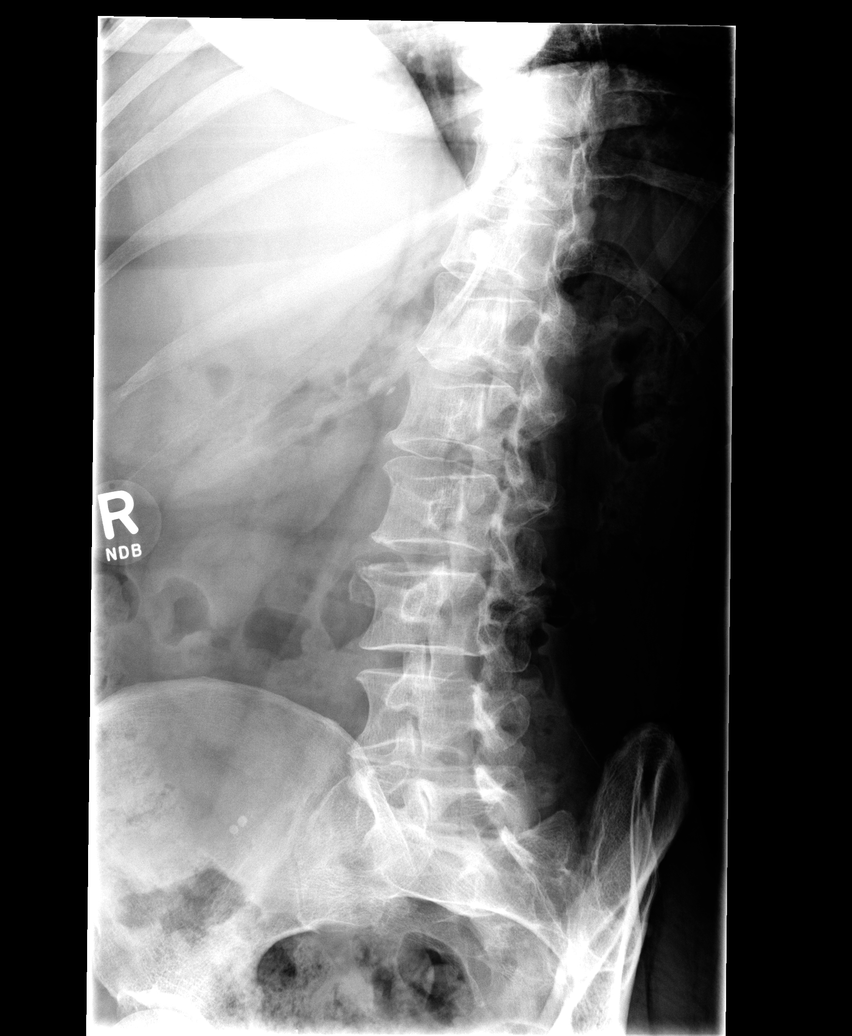

[view not recorded (3 of 5)]
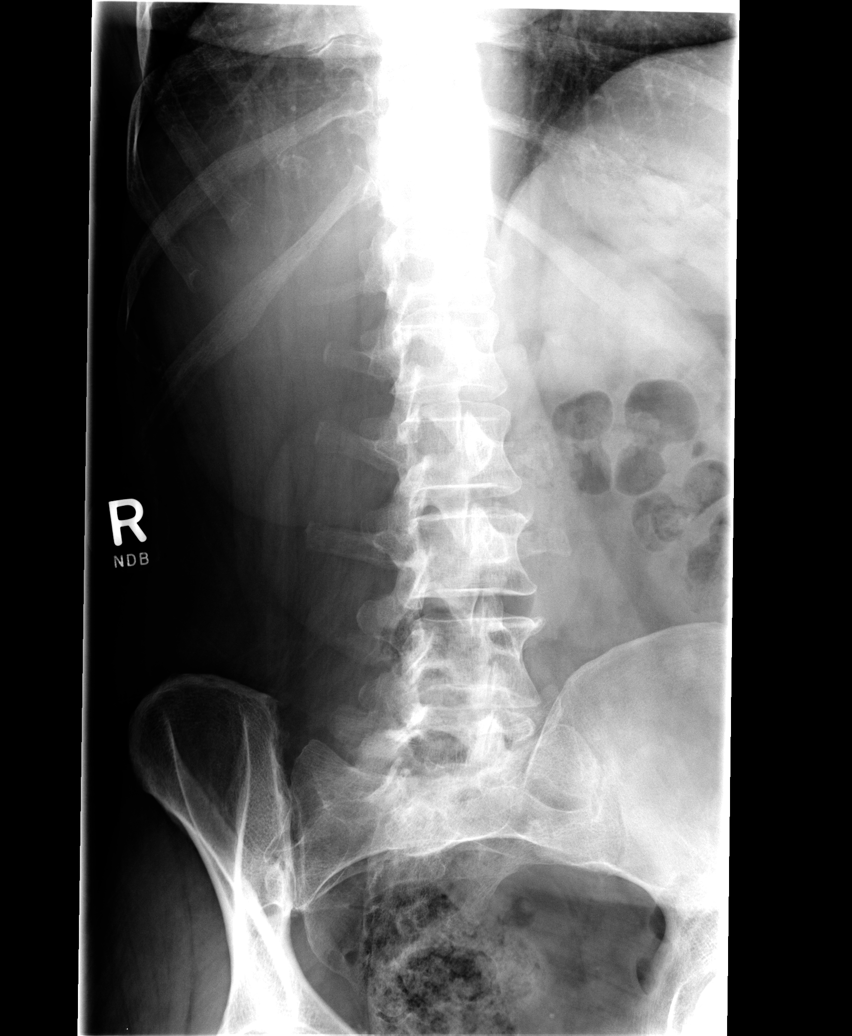

[view not recorded (4 of 5)]
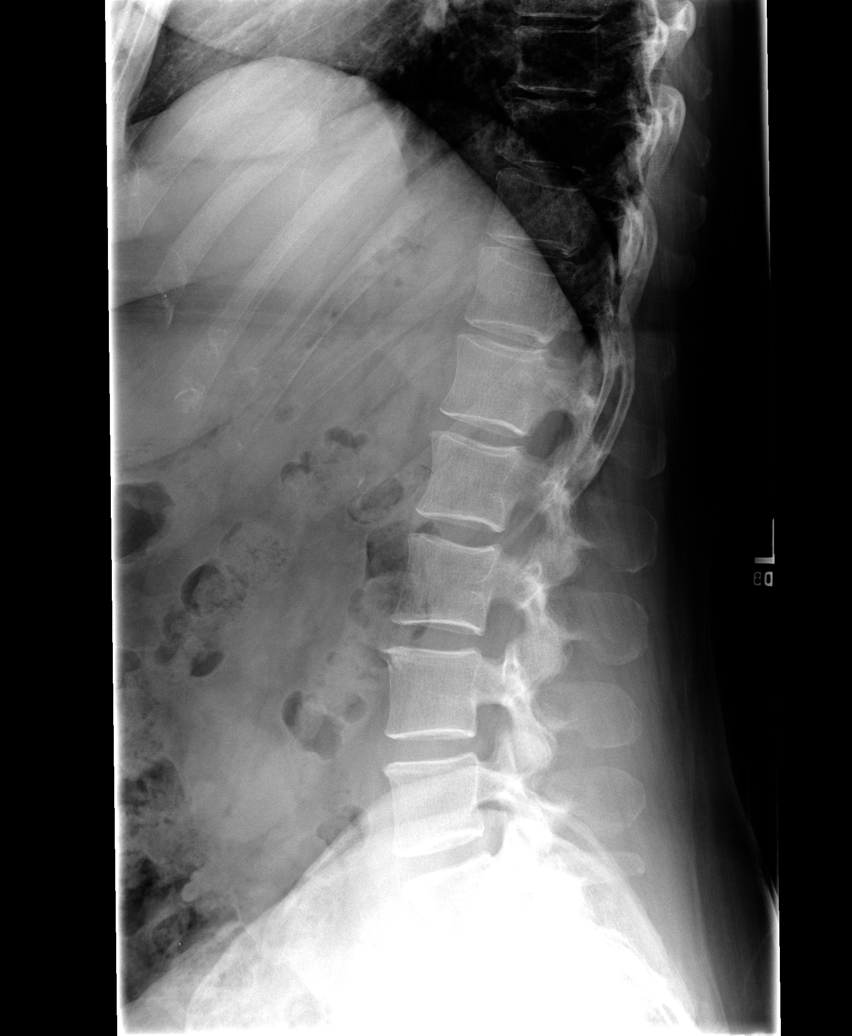

[view not recorded (5 of 5)]
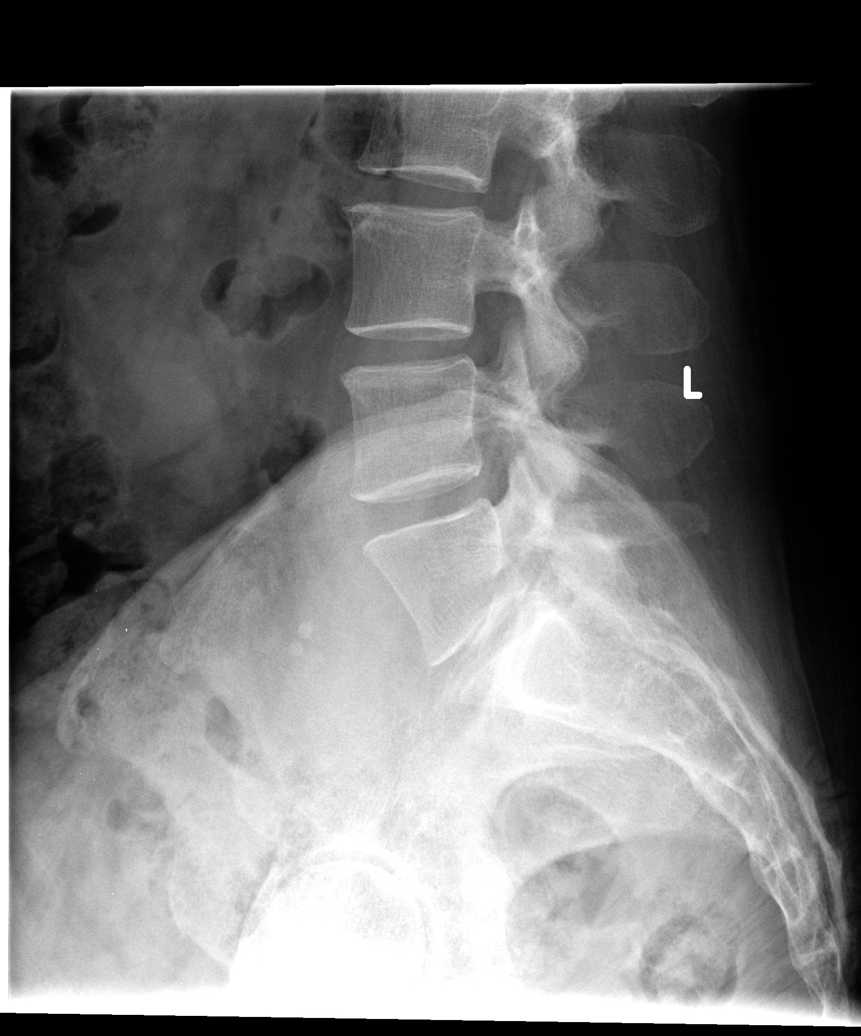

[5 of 5 positions shown; findings below may reference images not displayed]

FINDINGS: There is no evidence of acute fracture or subluxation.
Normal alignment is noted.
No focal bony lesions are present.
There is no evidence of spondylolysis.
The disc spaces are maintained.
IMPRESSION: No acute or significant bony abnormality.

## 2013-08-21 ENCOUNTER — Other Ambulatory Visit: Payer: Self-pay | Admitting: Orthopaedic Surgery

## 2013-08-21 DIAGNOSIS — M545 Low back pain, unspecified: Secondary | ICD-10-CM

## 2013-09-16 ENCOUNTER — Other Ambulatory Visit: Payer: Self-pay | Admitting: Emergency Medicine

## 2013-09-16 DIAGNOSIS — I10 Essential (primary) hypertension: Secondary | ICD-10-CM

## 2013-09-16 MED ORDER — ENALAPRIL MALEATE 20 MG PO TABS: 20.0000 mg | ORAL_TABLET | Freq: Every day | ORAL | Status: DC

## 2013-09-22 ENCOUNTER — Encounter: Payer: Self-pay | Admitting: Internal Medicine

## 2013-09-23 ENCOUNTER — Encounter: Payer: Self-pay | Admitting: Physician Assistant

## 2013-09-23 ENCOUNTER — Ambulatory Visit: Payer: Commercial Indemnity | Admitting: Physician Assistant

## 2013-09-23 VITALS — BP 124/72 | HR 72 | Temp 97.3°F | Resp 16 | Ht 65.0 in | Wt 174.0 lb

## 2013-09-23 DIAGNOSIS — I10 Essential (primary) hypertension: Secondary | ICD-10-CM

## 2013-09-23 DIAGNOSIS — E785 Hyperlipidemia, unspecified: Secondary | ICD-10-CM | POA: Insufficient documentation

## 2013-09-23 DIAGNOSIS — R7303 Prediabetes: Secondary | ICD-10-CM | POA: Insufficient documentation

## 2013-09-23 DIAGNOSIS — Z23 Encounter for immunization: Secondary | ICD-10-CM

## 2013-09-23 DIAGNOSIS — K219 Gastro-esophageal reflux disease without esophagitis: Secondary | ICD-10-CM | POA: Insufficient documentation

## 2013-09-23 LAB — HEPATIC FUNCTION PANEL
ALT: 43 U/L — ABNORMAL HIGH (ref 0–35)
Albumin: 4.6 g/dL (ref 3.5–5.2)
Alkaline Phosphatase: 83 U/L (ref 39–117)
Indirect Bilirubin: 0.4 mg/dL (ref 0.0–0.9)
Total Protein: 7.1 g/dL (ref 6.0–8.3)

## 2013-09-23 LAB — CBC WITH DIFFERENTIAL/PLATELET
Basophils Absolute: 0.1 10*3/uL (ref 0.0–0.1)
Basophils Relative: 1 % (ref 0–1)
Eosinophils Absolute: 0.2 10*3/uL (ref 0.0–0.7)
Eosinophils Relative: 2 % (ref 0–5)
HCT: 39.3 % (ref 36.0–46.0)
Hemoglobin: 13.5 g/dL (ref 12.0–15.0)
MCH: 31.3 pg (ref 26.0–34.0)
MCHC: 34.4 g/dL (ref 30.0–36.0)
MCV: 91.2 fL (ref 78.0–100.0)
Monocytes Absolute: 0.8 10*3/uL (ref 0.1–1.0)
Monocytes Relative: 9 % (ref 3–12)
Neutro Abs: 4.7 10*3/uL (ref 1.7–7.7)
RDW: 12.5 % (ref 11.5–15.5)

## 2013-09-23 LAB — BASIC METABOLIC PANEL WITH GFR
BUN: 19 mg/dL (ref 6–23)
Creat: 0.8 mg/dL (ref 0.50–1.10)
GFR, Est African American: >89 mL/min
Glucose, Bld: 97 mg/dL (ref 70–99)
Potassium: 4.4 mEq/L (ref 3.5–5.3)

## 2013-09-23 LAB — LIPID PANEL
HDL: 46 mg/dL (ref >39)
LDL Cholesterol: 121 mg/dL — ABNORMAL HIGH (ref 0–99)
Triglycerides: 124 mg/dL (ref <150)
VLDL: 25 mg/dL (ref 0–40)

## 2013-09-23 LAB — HEMOGLOBIN A1C: Hgb A1c MFr Bld: 6.3 % — ABNORMAL HIGH (ref <5.7)

## 2013-09-23 NOTE — Patient Instructions (Addendum)
We are starting you on Metformin to prevent or treat diabetes. Metformin does not cause low blood sugars. In order to create energy your cells need insulin and sugar but sometime your cells do not accept the insulin and this can cause increased sugars and decreased energy. The Metformin helps your cells accept insulin and the sugar to give you more energy.   The two most common side effects are nausea and diarrhea, follow these rules to avoid it! You can take imodium per box instructions when starting metformin if needed.   Rules of metformin: 1) start out slow with only one pill daily. Our goal for you is 4 pills a day or 2000mg  total.  2) take with your largest meal. 3) Take with least amount of carbs.   Call if you have any problems.   Interstitial Cystitis Interstitial cystitis (IC) is a condition that results in discomfort or pain in the bladder and the surrounding pelvic region. The symptoms can be different from case to case and even in the same individual. People may experience:  Mild discomfort.  Pressure.  Tenderness.  Intense pain in the bladder and pelvic area. CAUSES  Because IC varies so much in symptoms and severity, people studying this disease believe it is not one but several diseases. Some caregivers use the term painful bladder syndrome (PBS) to describe cases with painful urinary symptoms. This may not meet the strictest definition of IC. The term IC / PBS includes all cases of urinary pain that cannot be connected to other causes, such as infection or urinary stones.  SYMPTOMS  Symptoms may include:  An urgent need to urinate.  A frequent need to urinate.  A combination of these symptoms. Pain may change in intensity as the bladder fills with urine or as it empties. Women's symptoms often get worse during menstruation. They may sometimes experience pain with vaginal intercourse. Some of the symptoms of IC / PBS seem like those of bacterial infection. Tests do not  show infection. IC / PBS is far more common in women than in men.  DIAGNOSIS  The diagnosis of IC / PBS is based on:  Presence of pain related to the bladder, usually along with problems of frequency and urgency.  Not finding other diseases that could cause the symptoms.  Diagnostic tests that help rule out other diseases include:  Urinalysis.  Urine culture.  Cystoscopy.  Biopsy of the bladder wall.  Distension of the bladder under anesthesia.  Urine cytology.  Laboratory examination of prostate secretions. A biopsy is a tissue sample that can be looked at under a microscope. Samples of the bladder and urethra may be removed during a cystoscopy. A biopsy helps rule out bladder cancer. TREATMENT  Scientists have not yet found a cure for IC / PBS. Patients with IC / PBS do not get better with antibiotic therapy. Caregivers cannot predict who will respond best to which treatment. Symptoms may disappear without explanation. Disappearing symptoms may coincide with an event such as a change in diet or treatment. Even when symptoms disappear, they may return after days, weeks, months, or years.  Because the causes of IC / PBS are unknown, current treatments are aimed at relieving symptoms. Many people are helped by one or a combination of the treatments. As researchers learn more about IC / PBS, the list of potential treatments will change. Patients should discuss their options with a caregiver. SURGERY  Surgery should be considered only if all available treatments have failed and  the pain is disabling. Many approaches and techniques are used. Each approach has its own advantages and complications. Advantages and complications should be discussed with a urologist. Your caregiver may recommend consulting another urologist for a second opinion. Most caregivers are reluctant to operate because the outcome is unpredictable. Some people still have symptoms after surgery.  People considering  surgery should discuss the potential risks and benefits, side effects, and long- and short-term complications with their family, as well as with people who have already had the procedure. Surgery requires anesthesia, hospitalization, and in some cases weeks or months of recovery. As the complexity of the procedure increases, so do the chances for complications and for failure. HOME CARE INSTRUCTIONS   All drugs, even those sold over the counter, have side effects. Patients should always consult a caregiver before using any drug for an extended amount of time. Only take over-the-counter or prescription medicines for pain, discomfort, or fever as directed by your caregiver.  Many patients feel that smoking makes their symptoms worse. How the by-products of tobacco that are excreted in the urine affect IC / PBS is unknown. Smoking is the major known cause of bladder cancer. One of the best things smokers can do for their bladder and their overall health is to quit.  Many patients feel that gentle stretching exercises help relieve IC / PBS symptoms.  Methods vary, but basically patients decide to empty their bladder at designated times and use relaxation techniques and distractions to keep to the schedule. Gradually, patients try to lengthen the time between scheduled voids. A diary in which to record voiding times is usually helpful in keeping track of progress. MAKE SURE YOU:   Understand these instructions.  Will watch your condition.  Will get help right away if you are not doing well or get worse. Document Released: 06/22/2004 Document Revised: 01/14/2012 Document Reviewed: 09/06/2008 [facility] Patient Information 2014 [place], [place].

## 2013-09-23 NOTE — Progress Notes (Signed)
HPI Patient presents for a one month follow up.  Patient has been off the HCTZ since august. Patient has been treated by Dr. Dierdre Forth for back pain that ended up being a herniated disk. Patient was also on prednisone for her back and her insulin was 300, she tried the metformin but had diarrhea.   Past Medical History  Diagnosis Date  . Hypertension   . Hyperlipidemia   . GERD (gastroesophageal reflux disease)   . Anemia   . Allergy   . Pre-diabetes   . Vertigo     Allergies:  Allergies  Allergen Reactions  . Iodinated Diagnostic Agents Hives and Swelling    Prior reaction to ivp contrast Patient pre-medicated w/ 13 hour prep prednisone and benadryl   . Lipitor [Atorvastatin] Other (See Comments)    MUSCLE CRAMPS  . Metformin And Related Diarrhea    Current Medications:   Current Outpatient Prescriptions on File Prior to Visit  Medication Sig Dispense Refill  . b complex vitamins capsule Take 1 capsule by mouth daily.      . bisoprolol-hydrochlorothiazide (ZIAC) 10-6.25 MG per tablet Take 1 tablet by mouth daily.      . Cholecalciferol (VITAMIN D-3) 5000 UNITS TABS Take 5,000 Units by mouth daily.      . enalapril (VASOTEC) 20 MG tablet Take 1 tablet (20 mg total) by mouth daily.  90 tablet  1  . Multiple Vitamin (MULTIVITAMIN) tablet Take 1 tablet by mouth daily.      Marland Kitchen omeprazole (PRILOSEC) 40 MG capsule Take 40 mg by mouth daily. PRN      . rosuvastatin (CRESTOR) 40 MG tablet Take 40 mg by mouth daily.       No current facility-administered medications on file prior to visit.    ROS: all negative expect above.   Physical: Filed Weights   09/23/13 1458  Weight: 174 lb (78.926 kg)   Filed Vitals:   09/23/13 1458  BP: 124/72  Pulse: 72  Temp: 97.3 F (36.3 C)  Resp: 16   General Appearance: Well nourished, in no apparent distress. Eyes: PERRLA, EOMs. Sinuses: No Frontal/maxillary tenderness ENT/Mouth: Ext aud canals clear, normal light reflex with TMs without  erythema, bulging. Post pharynx without erythema, swelling, exudate.  Respiratory: CTAB Cardio: RRR, no murmurs, rubs or gallops. Peripheral pulses brisk and equal bilaterally, without edema. No aortic or femoral bruits. Abdomen: Flat, soft, with bowl sounds. Nontender, no guarding, rebound. Lymphatics: Non tender without lymphadenopathy.  Musculoskeletal: Full ROM all peripheral extremities, 5/5 strength, and normal gait. Skin: Warm, dry without rashes, lesions, ecchymosis.  Neuro: Cranial nerves intact, reflexes equal bilaterally. Normal muscle tone, no cerebellar symptoms. Sensation intact.  Pysch: Awake and oriented X 3, normal affect, Insight and Judgment appropriate.   Assessment and Plan: 1) HTN- take enalapril 20- 1/2 in AM and 1/2 in PM- check BP at home and take HCTZ as needed for swelling.  2) Insulin resistance- will try MF again with MF rules printed out for her.

## 2013-10-27 ENCOUNTER — Ambulatory Visit: Payer: Self-pay | Admitting: Physician Assistant

## 2013-11-02 ENCOUNTER — Ambulatory Visit (INDEPENDENT_AMBULATORY_CARE_PROVIDER_SITE_OTHER): Payer: Commercial Indemnity | Admitting: Emergency Medicine

## 2013-11-02 ENCOUNTER — Encounter: Payer: Self-pay | Admitting: Emergency Medicine

## 2013-11-02 VITALS — BP 124/82 | HR 74 | Temp 98.2°F | Resp 18 | Wt 172.0 lb

## 2013-11-02 DIAGNOSIS — R059 Cough, unspecified: Secondary | ICD-10-CM

## 2013-11-02 DIAGNOSIS — I10 Essential (primary) hypertension: Secondary | ICD-10-CM

## 2013-11-02 DIAGNOSIS — J309 Allergic rhinitis, unspecified: Secondary | ICD-10-CM

## 2013-11-02 DIAGNOSIS — R05 Cough: Secondary | ICD-10-CM

## 2013-11-02 DIAGNOSIS — J029 Acute pharyngitis, unspecified: Secondary | ICD-10-CM

## 2013-11-02 MED ORDER — BENZONATATE 100 MG PO CAPS: 100.0000 mg | ORAL_CAPSULE | Freq: Three times a day (TID) | ORAL | Status: DC | PRN

## 2013-11-02 MED ORDER — AZITHROMYCIN 250 MG PO TABS: ORAL_TABLET | ORAL | Status: AC

## 2013-11-02 MED ORDER — PREDNISONE 10 MG PO TABS: ORAL_TABLET | ORAL | Status: DC

## 2013-11-02 NOTE — Patient Instructions (Signed)
Hypertension Hypertension is another name for high blood pressure. High blood pressure may mean that your heart needs to work harder to pump blood. Blood pressure consists of two numbers, which includes a higher number over a lower number (example: 110/72). HOME CARE   Make lifestyle changes as told by your doctor. This may include weight loss and exercise.  Take your blood pressure medicine every day.  Limit how much salt you use.  Stop smoking if you smoke.  Do not use drugs.  Talk to your doctor if you are using decongestants or birth control pills. These medicines might make blood pressure higher.  Females should not drink more than 1 alcoholic drink per day. Males should not drink more than 2 alcoholic drinks per day.  See your doctor as told. GET HELP RIGHT AWAY IF:   You have a blood pressure reading with a top number of 180 or higher.  You get a very bad headache.  You get blurred or changing vision.  You feel confused.  You feel weak, numb, or faint.  You get chest or belly (abdominal) pain.  You throw up (vomit).  You cannot breathe very well. MAKE SURE YOU:   Understand these instructions.  Will watch your condition.  Will get help right away if you are not doing well or get worse. Document Released: 04/09/2008 Document Revised: 01/14/2012 Document Reviewed: 04/09/2008 [facility] Patient Information 2014 [place], [place]. Cough, Adult  A cough is a reflex. It helps you clear your throat and airways. A cough can help heal your body. A cough can last 2 or 3 weeks (acute) or may last more than 8 weeks (chronic). Some common causes of a cough can include an infection, allergy, or a cold. HOME CARE  Only take medicine as told by your doctor.  If given, take your medicines (antibiotics) as told. Finish them even if you start to feel better.  Use a cold steam vaporizer or humidier in your home. This can help loosen thick spit (secretions).  Sleep so you are  almost sitting up (semi-upright). Use pillows to do this. This helps reduce coughing.  Rest as needed.  Stop smoking if you smoke. GET HELP RIGHT AWAY IF:  You have yellowish-white fluid (pus) in your thick spit.  Your cough gets worse.  Your medicine does not reduce coughing, and you are losing sleep.  You cough up blood.  You have trouble breathing.  Your pain gets worse and medicine does not help.  You have a fever. MAKE SURE YOU:   Understand these instructions.  Will watch your condition.  Will get help right away if you are not doing well or get worse. Document Released: 07/05/2011 Document Revised: 01/14/2012 Document Reviewed: 07/05/2011 [facility] Patient Information 2014 [place], [place]. Sore Throat A sore throat is a painful, burning, sore, or scratchy feeling of the throat. There may be pain or tenderness when swallowing or talking. You may have other symptoms with a sore throat. These include coughing, sneezing, fever, or a swollen neck. A sore throat is often the first sign of another sickness. These sicknesses may include a cold, flu, strep throat, or an infection called mono. Most sore throats go away without medical treatment.  HOME CARE   Only take medicine as told by your doctor.  Drink enough fluids to keep your pee (urine) clear or pale yellow.  Rest as needed.  Try using throat sprays, lozenges, or suck on hard candy (if older than 4 years or as told).  Sip warm liquids, such as broth, herbal tea, or warm water with honey. Try sucking on frozen ice pops or drinking cold liquids.  Rinse the mouth (gargle) with salt water. Mix 1 teaspoon salt with 8 ounces of water.  Do not smoke. Avoid being around others when they are smoking.  Put a humidifier in your bedroom at night to moisten the air. You can also turn on a hot shower and sit in the bathroom for 5 10 minutes. Be sure the bathroom door is closed. GET HELP RIGHT AWAY IF:   You have trouble  breathing.  You cannot swallow fluids, soft foods, or your spit (saliva).  You have more puffiness (swelling) in the throat.  Your sore throat does not get better in 7 days.  You feel sick to your stomach (nauseous) and throw up (vomit).  You have a fever or lasting symptoms for more than 2 3 days.  You have a fever and your symptoms suddenly get worse. MAKE SURE YOU:   Understand these instructions.  Will watch your condition.  Will get help right away if you are not doing well or get worse. Document Released: 07/31/2008 Document Revised: 07/16/2012 Document Reviewed: 06/29/2012 [facility] Patient Information 2014 [place], [place]. Allergic Rhinitis Allergic rhinitis is when the mucous membranes in the nose respond to allergens. Allergens are particles in the air that cause your body to have an allergic reaction. This causes you to release allergic antibodies. Through a chain of events, these eventually cause you to release histamine into the blood stream (hence the use of antihistamines). Although meant to be protective to the body, it is this release that causes your discomfort, such as frequent sneezing, congestion and an itchy runny nose.  CAUSES  The pollen allergens may come from grasses, trees, and weeds. This is seasonal allergic rhinitis, or "hay fever." Other allergens cause year-round allergic rhinitis (perennial allergic rhinitis) such as house dust mite allergen, pet dander and mold spores.  SYMPTOMS   Nasal stuffiness (congestion).  Runny, itchy nose with sneezing and tearing of the eyes.  There is often an itching of the mouth, eyes and ears. It cannot be cured, but it can be controlled with medications. DIAGNOSIS  If you are unable to determine the offending allergen, skin or blood testing may find it. TREATMENT   Avoid the allergen.  Medications and allergy shots (immunotherapy) can help.  Hay fever may often be treated with antihistamines in pill or nasal  spray forms. Antihistamines block the effects of histamine. There are over-the-counter medicines that may help with nasal congestion and swelling around the eyes. Check with your caregiver before taking or giving this medicine. If the treatment above does not work, there are many new medications your caregiver can prescribe. Stronger medications may be used if initial measures are ineffective. Desensitizing injections can be used if medications and avoidance fails. Desensitization is when a patient is given ongoing shots until the body becomes less sensitive to the allergen. Make sure you follow up with your caregiver if problems continue. SEEK MEDICAL CARE IF:   You develop fever (more than 100.5 F (38.1 C).  You develop a cough that does not stop easily (persistent).  You have shortness of breath.  You start wheezing.  Symptoms interfere with normal daily activities. Document Released: 07/17/2001 Document Revised: 01/14/2012 Document Reviewed: 01/26/2009 [facility] Patient Information 2014 [place], [place].

## 2013-11-02 NOTE — Progress Notes (Signed)
Subjective:    Patient ID: Joanne Sanchez, female    DOB: 1956/07/24, 57 y.o.   MRN: [id number]  HPI Comments: 57 yo female with 2 weeks of chest congestion with increased cough at night. Now with green production x 2 days on off. + increase with fatigue since illness.  Off all RX x 2 days b/c makes her feel swelling in her throat. She notes BP 120-30s/80s off of RX. She notes she has been feeling the throat discomfort since starting Enalapril and wants to try to stay off for trial period to see if symptoms improve. She did not try reflux RX AD.  Headache  Associated symptoms include coughing, sinus pressure and a sore throat.  Sinusitis Associated symptoms include congestion, coughing, headaches, sinus pressure and a sore throat.   Current Outpatient Prescriptions on File Prior to Visit  Medication Sig Dispense Refill  . rosuvastatin (CRESTOR) 40 MG tablet Take 40 mg by mouth daily.      Marland Kitchen b complex vitamins capsule Take 1 capsule by mouth daily.      . bisoprolol-hydrochlorothiazide (ZIAC) 10-6.25 MG per tablet Take 1 tablet by mouth daily.      . Cholecalciferol (VITAMIN D-3) 5000 UNITS TABS Take 5,000 Units by mouth daily.      . enalapril (VASOTEC) 20 MG tablet Take 1 tablet (20 mg total) by mouth daily.  90 tablet  1  . MAGNESIUM CHLORIDE-CALCIUM PO Take by mouth daily.      . Multiple Vitamin (MULTIVITAMIN) tablet Take 1 tablet by mouth daily.      Marland Kitchen omeprazole (PRILOSEC) 40 MG capsule Take 40 mg by mouth daily. PRN       No current facility-administered medications on file prior to visit.   ALLERGIES Iodinated diagnostic agents; Lipitor; and Metformin and related  Past Medical History  Diagnosis Date  . Hypertension   . Hyperlipidemia   . GERD (gastroesophageal reflux disease)   . Anemia   . Allergy   . Pre-diabetes   . Vertigo       Review of Systems  HENT: Positive for congestion, sinus pressure and sore throat.   Respiratory: Positive for cough.    Neurological: Positive for headaches.  BP 124/82  Pulse 74  Temp(Src) 98.2 F (36.8 C) (Temporal)  Resp 18  Wt 172 lb (78.019 kg)      Objective:   Physical Exam  Nursing note and vitals reviewed. Constitutional: She is oriented to person, place, and time. She appears well-developed and well-nourished. No distress.  HENT:  Head: Normocephalic and atraumatic.  Right Ear: External ear normal.  Left Ear: External ear normal.  Nose: Nose normal.  Mouth/Throat: Oropharynx is clear and moist. No oropharyngeal exudate.  POST PHARYNX- ERYTHEMA  Eyes: Conjunctivae and EOM are normal.  Neck: Normal range of motion. Neck supple. No JVD present. No thyromegaly present.  Cardiovascular: Normal rate, regular rhythm, normal heart sounds and intact distal pulses.   Pulmonary/Chest: Effort normal and breath sounds normal.  RASPY COUGH  Abdominal: Soft. Bowel sounds are normal. She exhibits no distension and no mass. There is no tenderness. There is no rebound and no guarding.  Musculoskeletal: Normal range of motion. She exhibits no edema and no tenderness.  Lymphadenopathy:    She has no cervical adenopathy.  Neurological: She is alert and oriented to person, place, and time. No cranial nerve deficit.  Skin: Skin is warm and dry. No rash noted. No erythema. No pallor.  Psychiatric: She has a  normal mood and affect. Her behavior is normal. Judgment and thought content normal.          Assessment & Plan:  1. Pharyngitis/ Cough/ Allergic rhinitis- Allegra OTC, increase H2o, allergy hygiene explained. Zpak/ Tessalon perles/ Pred 10 mg DP all AD. 2. HTN HX- with concern for allergy with Enalapril with throat swelling, trial off all RX x 2 weeks w/c with results. Check BP call if >130/80, increase cardio

## 2013-11-06 ENCOUNTER — Encounter: Payer: Self-pay | Admitting: Gastroenterology

## 2013-11-06 ENCOUNTER — Telehealth: Payer: Self-pay | Admitting: Emergency Medicine

## 2013-11-06 NOTE — Telephone Encounter (Signed)
Pt called said she saw Joanne Sanchez a few days ago for her throat problems ans was giving prednisone and now she feels like her throat is swelling and having hard time to breathe. Talked to [facility] and she said pt needs to go to the ER. Told pt that and she said she will go.

## 2013-11-30 ENCOUNTER — Ambulatory Visit: Payer: Commercial Indemnity | Admitting: Gastroenterology

## 2013-12-31 ENCOUNTER — Encounter: Payer: Self-pay | Admitting: Internal Medicine

## 2013-12-31 DIAGNOSIS — Z79899 Other long term (current) drug therapy: Secondary | ICD-10-CM | POA: Insufficient documentation

## 2013-12-31 NOTE — Progress Notes (Deleted)
error 

## 2013-12-31 NOTE — Patient Instructions (Addendum)
Error

## 2014-01-01 ENCOUNTER — Telehealth: Payer: Self-pay | Admitting: Internal Medicine

## 2014-01-01 ENCOUNTER — Encounter: Payer: Self-pay | Admitting: Internal Medicine

## 2014-01-01 NOTE — Progress Notes (Signed)
Patient ID: Joanne Sanchez, female   DOB: September 06, 1956, 58 y.o.   MRN: [id number] error

## 2014-01-01 NOTE — Telephone Encounter (Signed)
CALLED PT TO [place] MISSED  OV WITH DR MCK 01-01-14

## 2014-05-27 ENCOUNTER — Encounter: Payer: Self-pay | Admitting: Emergency Medicine

## 2014-12-08 ENCOUNTER — Other Ambulatory Visit (HOSPITAL_COMMUNITY): Payer: Self-pay | Admitting: Family Medicine

## 2014-12-08 DIAGNOSIS — M8580 Other specified disorders of bone density and structure, unspecified site: Secondary | ICD-10-CM

## 2014-12-08 DIAGNOSIS — Z1231 Encounter for screening mammogram for malignant neoplasm of breast: Secondary | ICD-10-CM

## 2014-12-14 ENCOUNTER — Ambulatory Visit (HOSPITAL_COMMUNITY)
Admission: RE | Admit: 2014-12-14 | Discharge: 2014-12-14 | Disposition: A | Discharge status: Home / Self Care | Payer: BLUE CROSS/BLUE SHIELD | Source: Ambulatory Visit | Attending: Family Medicine | Admitting: Family Medicine

## 2014-12-14 DIAGNOSIS — Z13820 Encounter for screening for osteoporosis: Secondary | ICD-10-CM | POA: Insufficient documentation

## 2014-12-14 DIAGNOSIS — Z780 Asymptomatic menopausal state: Secondary | ICD-10-CM | POA: Insufficient documentation

## 2014-12-14 DIAGNOSIS — Z1231 Encounter for screening mammogram for malignant neoplasm of breast: Secondary | ICD-10-CM

## 2014-12-14 DIAGNOSIS — M8580 Other specified disorders of bone density and structure, unspecified site: Secondary | ICD-10-CM

## 2015-08-24 ENCOUNTER — Encounter: Payer: Self-pay | Admitting: Gastroenterology

## 2016-01-16 ENCOUNTER — Ambulatory Visit: Payer: Self-pay | Admitting: Internal Medicine

## 2016-02-07 DIAGNOSIS — E538 Deficiency of other specified B group vitamins: Secondary | ICD-10-CM | POA: Diagnosis not present

## 2016-02-07 DIAGNOSIS — E559 Vitamin D deficiency, unspecified: Secondary | ICD-10-CM | POA: Diagnosis not present

## 2016-02-07 DIAGNOSIS — E785 Hyperlipidemia, unspecified: Secondary | ICD-10-CM | POA: Diagnosis not present

## 2016-02-07 DIAGNOSIS — R7301 Impaired fasting glucose: Secondary | ICD-10-CM | POA: Diagnosis not present

## 2016-02-14 ENCOUNTER — Other Ambulatory Visit: Payer: Self-pay

## 2016-02-14 DIAGNOSIS — Z1231 Encounter for screening mammogram for malignant neoplasm of breast: Secondary | ICD-10-CM

## 2016-02-21 ENCOUNTER — Encounter: Payer: Self-pay | Admitting: Physician Assistant

## 2016-02-21 ENCOUNTER — Encounter: Payer: Self-pay | Admitting: Gastroenterology

## 2016-02-24 DIAGNOSIS — Z822 Family history of deafness and hearing loss: Secondary | ICD-10-CM | POA: Diagnosis not present

## 2016-02-24 DIAGNOSIS — H903 Sensorineural hearing loss, bilateral: Secondary | ICD-10-CM | POA: Diagnosis not present

## 2016-02-24 DIAGNOSIS — H9313 Tinnitus, bilateral: Secondary | ICD-10-CM | POA: Diagnosis not present

## 2016-03-16 ENCOUNTER — Ambulatory Visit
Admission: RE | Admit: 2016-03-16 | Discharge: 2016-03-16 | Disposition: A | Discharge status: Home / Self Care | Payer: BLUE CROSS/BLUE SHIELD | Source: Ambulatory Visit

## 2016-03-16 DIAGNOSIS — Z1231 Encounter for screening mammogram for malignant neoplasm of breast: Secondary | ICD-10-CM

## 2016-05-01 ENCOUNTER — Ambulatory Visit (INDEPENDENT_AMBULATORY_CARE_PROVIDER_SITE_OTHER): Payer: BLUE CROSS/BLUE SHIELD | Admitting: Gastroenterology

## 2016-05-01 ENCOUNTER — Encounter: Payer: Self-pay | Admitting: Gastroenterology

## 2016-05-01 VITALS — BP 118/78 | HR 72 | Ht 65.0 in | Wt 170.0 lb

## 2016-05-01 DIAGNOSIS — K219 Gastro-esophageal reflux disease without esophagitis: Secondary | ICD-10-CM

## 2016-05-01 DIAGNOSIS — Z1211 Encounter for screening for malignant neoplasm of colon: Secondary | ICD-10-CM

## 2016-05-01 DIAGNOSIS — Z1212 Encounter for screening for malignant neoplasm of rectum: Secondary | ICD-10-CM | POA: Diagnosis not present

## 2016-05-01 NOTE — Patient Instructions (Signed)
Remain on omeprazole 40 mg daily.   Patient advised to avoid spicy, acidic, citrus, chocolate, mints, fruit and fruit juices.  Limit the intake of caffeine, alcohol and Soda.  Don't exercise too soon after eating.  Don't lie down within 3-4 hours of eating.  Elevate the head of your bed.  You have been scheduled for an endoscopy. Please follow written instructions given to you at your visit today. If you use inhalers (even only as needed), please bring them with you on the day of your procedure. Your physician has requested that you go to www.startemmi.com and enter the access code given to you at your visit today. This web site gives a general overview about your procedure. However, you should still follow specific instructions given to you by our office regarding your preparation for the procedure.  Thank you for choosing me and [facility].  Pricilla Riffle. Dagoberto Ligas., MD., Marval Regal

## 2016-05-01 NOTE — Progress Notes (Signed)
    History of Present Illness: This is a 60 year old female referred by Maurice Small, MD for the evaluation of GERD. Patient relates a 10 year history of frequent reflux symptoms including daytime heartburn, nighttime heartburn, regurgitation, occasional cough, fullness in her neck and breathing difficulties. She was placed on omeprazole 20 g daily by Dr. Justin Mend and her symptoms improved but did not resolve so omeprazole was increased to 40 mg daily and her symptoms have completely resolved. Denies weight loss, abdominal pain, constipation, diarrhea, change in stool caliber, melena, hematochezia, nausea, vomiting, dysphagia, chest pain.   Review of Systems: Pertinent positive and negative review of systems were noted in the above HPI section. All other review of systems were otherwise negative.  Current Medications, Allergies, Past Medical History, Past Surgical History, Family History and Social History were reviewed in [employer] record.  Physical Exam: General: Well developed, well nourished, no acute distress Head: Normocephalic and atraumatic Eyes:  sclerae anicteric, EOMI Ears: Normal auditory acuity Mouth: No deformity or lesions Neck: Supple, no masses or thyromegaly Lungs: Clear throughout to auscultation Heart: Regular rate and rhythm; no murmurs, rubs or bruits Abdomen: Soft, non tender and non distended. No masses, hepatosplenomegaly or hernias noted. Normal Bowel sounds Musculoskeletal: Symmetrical with no gross deformities  Skin: No lesions on visible extremities Pulses:  Normal pulses noted Extremities: No clubbing, cyanosis, edema or deformities noted Neurological: Alert oriented x 4, grossly nonfocal Cervical Nodes:  No significant cervical adenopathy Inguinal Nodes: No significant inguinal adenopathy Psychological:  Alert and cooperative. Normal mood and affect  Assessment and Recommendations:  1. GERD. Rule out erosive esophagitis, Barrett's.  Continue omeprazole 40 mg daily and follow standard antireflux measures. Schedule EGD. The risks (including bleeding, perforation, infection, missed lesions, medication reactions and possible hospitalization or surgery if complications occur), benefits, and alternatives to endoscopy with possible biopsy and possible dilation were discussed with the patient and they consent to proceed.   2. CRC screening average risk. Screening colonoscopy in May 2008 was normal. A 10 year interval screening colonoscopy is recommended in May 2018.   cc: Maurice Small, MD [address] [place], [place] [address]

## 2016-05-04 ENCOUNTER — Encounter: Payer: Self-pay | Admitting: Gastroenterology

## 2016-05-04 ENCOUNTER — Ambulatory Visit (AMBULATORY_SURGERY_CENTER): Payer: BLUE CROSS/BLUE SHIELD | Admitting: Gastroenterology

## 2016-05-04 VITALS — BP 106/58 | HR 61 | Temp 98.0°F | Resp 15 | Ht 65.0 in | Wt 170.0 lb

## 2016-05-04 DIAGNOSIS — K317 Polyp of stomach and duodenum: Secondary | ICD-10-CM

## 2016-05-04 DIAGNOSIS — K229 Disease of esophagus, unspecified: Secondary | ICD-10-CM | POA: Diagnosis not present

## 2016-05-04 DIAGNOSIS — K208 Other esophagitis: Secondary | ICD-10-CM | POA: Diagnosis not present

## 2016-05-04 DIAGNOSIS — K219 Gastro-esophageal reflux disease without esophagitis: Secondary | ICD-10-CM

## 2016-05-04 MED ORDER — SODIUM CHLORIDE 0.9 % IV SOLN: 500.0000 mL | INTRAVENOUS | Status: DC

## 2016-05-04 NOTE — Op Note (Signed)
[facility] Patient Name: Avalena Gustus Procedure Date: 05/04/2016 10:09 AM MRN: [id number] Endoscopist: Ladene Artist , MD Age: 60 Referring MD:  Date of Birth: 1956-07-10 Gender: Female Account #: [id number] Procedure:                Upper GI endoscopy Indications:              Heartburn, Suspected esophageal reflux Medicines:                Monitored Anesthesia Care Procedure:                Pre-Anesthesia Assessment:                           - Prior to the procedure, a History and Physical                            was performed, and patient medications and                            allergies were reviewed. The patient's tolerance of                            previous anesthesia was also reviewed. The risks                            and benefits of the procedure and the sedation                            options and risks were discussed with the patient.                            All questions were answered, and informed consent                            was obtained. Prior Anticoagulants: The patient has                            taken no previous anticoagulant or antiplatelet                            agents. ASA Grade Assessment: II - A patient with                            mild systemic disease. After reviewing the risks                            and benefits, the patient was deemed in                            satisfactory condition to undergo the procedure.                           After obtaining informed consent, the endoscope was  passed under direct vision. Throughout the                            procedure, the patient's blood pressure, pulse, and                            oxygen saturations were monitored continuously. The                            Model GIF-HQ190 [id number]) scope was introduced                            through the mouth, and advanced to the second part                            of duodenum.  The upper GI endoscopy was                            accomplished without difficulty. The patient                            tolerated the procedure fairly well. Scope In: Scope Out: Findings:                 A single 5 mm mucosal erythematous nodule was found                            at the gastroesophageal junction. Biopsies were                            taken with a cold forceps for histology.                           The exam of the esophagus was otherwise normal.                           Multiple 3 to 5 mm sessile polyps with no bleeding                            and no stigmata of recent bleeding were found in                            the gastric body. Biopsies were taken with a cold                            forceps for histology.                           A small hiatal hernia was present.                           The exam of the stomach was otherwise normal.  The duodenal bulb and second portion of the                            duodenum were normal. Complications:            No immediate complications. Estimated Blood Loss:     Estimated blood loss: none. Impression:               - Mucosal nodule found in the esophagus. Biopsied.                           - Multiple gastric polyps. Biopsied.                           - Small hiatal hernia.                           - Normal duodenal bulb and second portion of the                            duodenum. Recommendation:           - Patient has a contact number available for                            emergencies. The signs and symptoms of potential                            delayed complications were discussed with the                            patient. Return to normal activities tomorrow.                            Written discharge instructions were provided to the                            patient.                           - Resume previous diet with antireflux measures.                            - Continue present medications including omeprazole                            40 mg daily.                           - Await pathology results. Ladene Artist, MD 05/04/2016 10:39:42 AM This report has been signed electronically.

## 2016-05-04 NOTE — Progress Notes (Signed)
Teeth unchanged after procedure.A and O x3. Report to RN. Tolerated MAC anesthesia well. 

## 2016-05-04 NOTE — Progress Notes (Signed)
Called to room to assist during endoscopic procedure.  Patient ID and intended procedure confirmed with present staff. Received instructions for my participation in the procedure from the performing physician.  

## 2016-05-04 NOTE — Patient Instructions (Signed)
YOU HAD AN ENDOSCOPIC PROCEDURE TODAY AT [facility]:   Refer to the procedure report that was given to you for any specific questions about what was found during the examination.  If the procedure report does not answer your questions, please call your gastroenterologist to clarify.  If you requested that your care partner not be given the details of your procedure findings, then the procedure report has been included in a sealed envelope for you to review at your convenience later.  YOU SHOULD EXPECT: Some feelings of bloating in the abdomen. Passage of more gas than usual.  Walking can help get rid of the air that was put into your GI tract during the procedure and reduce the bloating. If you had a lower endoscopy (such as a colonoscopy or flexible sigmoidoscopy) you may notice spotting of blood in your stool or on the toilet paper. If you underwent a bowel prep for your procedure, you may not have a normal bowel movement for a few days.  Please Note:  You might notice some irritation and congestion in your nose or some drainage.  This is from the oxygen used during your procedure.  There is no need for concern and it should clear up in a day or so.  SYMPTOMS TO REPORT IMMEDIATELY:     Following upper endoscopy (EGD)  Vomiting of blood or coffee ground material  New chest pain or pain under the shoulder blades  Painful or persistently difficult swallowing  New shortness of breath  Fever of 100F or higher  Black, tarry-looking stools  For urgent or emergent issues, a gastroenterologist can be reached at any hour by calling [phone].   DIET: Your first meal following the procedure should be a small meal and then it is ok to progress to your normal diet. Heavy or fried foods are harder to digest and may make you feel nauseous or bloated.  Likewise, meals heavy in dairy and vegetables can increase bloating.  Drink plenty of fluids but you should avoid alcoholic beverages  for 24 hours.  ACTIVITY:  You should plan to take it easy for the rest of today and you should NOT DRIVE or use heavy machinery until tomorrow (because of the sedation medicines used during the test).    FOLLOW UP: Our staff will call the number listed on your records the next business day following your procedure to check on you and address any questions or concerns that you may have regarding the information given to you following your procedure. If we do not reach you, we will leave a message.  However, if you are feeling well and you are not experiencing any problems, there is no need to return our call.  We will assume that you have returned to your regular daily activities without incident.  If any biopsies were taken you will be contacted by phone or by letter within the next 1-3 weeks.  Please call us at [phone] if you have not heard about the biopsies in 3 weeks.    SIGNATURES/CONFIDENTIALITY: You and/or your care partner have signed paperwork which will be entered into your electronic medical record.  These signatures attest to the fact that that the information above on your After Visit Summary has been reviewed and is understood.  Full responsibility of the confidentiality of this discharge information lies with you and/or your care-partner.   Continue previous  medications including Omeprazole   Await pathology results

## 2016-05-07 ENCOUNTER — Telehealth: Payer: Self-pay | Admitting: *Deleted

## 2016-05-07 NOTE — Telephone Encounter (Signed)
  Follow up Call-  Call back number 05/04/2016  Post procedure Call Back phone  # [phone]  Permission to leave phone message Yes     Patient questions:  Do you have a fever, pain , or abdominal swelling? No. Pain Score  0 *  Have you tolerated food without any problems? Yes.    Have you been able to return to your normal activities? Yes.    Do you have any questions about your discharge instructions: Diet   No. Medications  No. Follow up visit  No.  Do you have questions or concerns about your Care? No.  Actions: * If pain score is 4 or above: No action needed, pain <4.

## 2016-05-13 ENCOUNTER — Encounter: Payer: Self-pay | Admitting: Gastroenterology

## 2016-05-29 DIAGNOSIS — I10 Essential (primary) hypertension: Secondary | ICD-10-CM | POA: Diagnosis not present

## 2016-05-29 DIAGNOSIS — E785 Hyperlipidemia, unspecified: Secondary | ICD-10-CM | POA: Diagnosis not present

## 2016-07-02 DIAGNOSIS — K219 Gastro-esophageal reflux disease without esophagitis: Secondary | ICD-10-CM | POA: Diagnosis not present

## 2016-07-02 DIAGNOSIS — J209 Acute bronchitis, unspecified: Secondary | ICD-10-CM | POA: Diagnosis not present

## 2016-08-24 DIAGNOSIS — G4733 Obstructive sleep apnea (adult) (pediatric): Secondary | ICD-10-CM | POA: Diagnosis not present

## 2016-08-24 DIAGNOSIS — Z23 Encounter for immunization: Secondary | ICD-10-CM | POA: Diagnosis not present

## 2016-09-14 DIAGNOSIS — G4733 Obstructive sleep apnea (adult) (pediatric): Secondary | ICD-10-CM | POA: Diagnosis not present

## 2016-10-26 DIAGNOSIS — R05 Cough: Secondary | ICD-10-CM | POA: Diagnosis not present

## 2016-11-01 DIAGNOSIS — G4733 Obstructive sleep apnea (adult) (pediatric): Secondary | ICD-10-CM | POA: Diagnosis not present

## 2016-11-27 ENCOUNTER — Other Ambulatory Visit: Payer: Self-pay | Admitting: Family Medicine

## 2016-11-27 ENCOUNTER — Ambulatory Visit
Admission: RE | Admit: 2016-11-27 | Discharge: 2016-11-27 | Disposition: A | Discharge status: Home / Self Care | Payer: BLUE CROSS/BLUE SHIELD | Source: Ambulatory Visit | Attending: Family Medicine | Admitting: Family Medicine

## 2016-11-27 DIAGNOSIS — R0602 Shortness of breath: Secondary | ICD-10-CM | POA: Diagnosis not present

## 2016-11-27 DIAGNOSIS — R5383 Other fatigue: Secondary | ICD-10-CM | POA: Diagnosis not present

## 2016-11-27 DIAGNOSIS — R05 Cough: Secondary | ICD-10-CM | POA: Diagnosis not present

## 2016-11-27 DIAGNOSIS — F329 Major depressive disorder, single episode, unspecified: Secondary | ICD-10-CM | POA: Diagnosis not present

## 2016-11-27 IMAGING — DX DG CHEST 2V
2 series · 2 of 2 positions shown · non-contrast
Comparison: None.

CLINICAL DATA: Cough, shortness of Breath

EXAM:
CHEST  2 VIEW

[dg chest 2 view (1 of 2)]
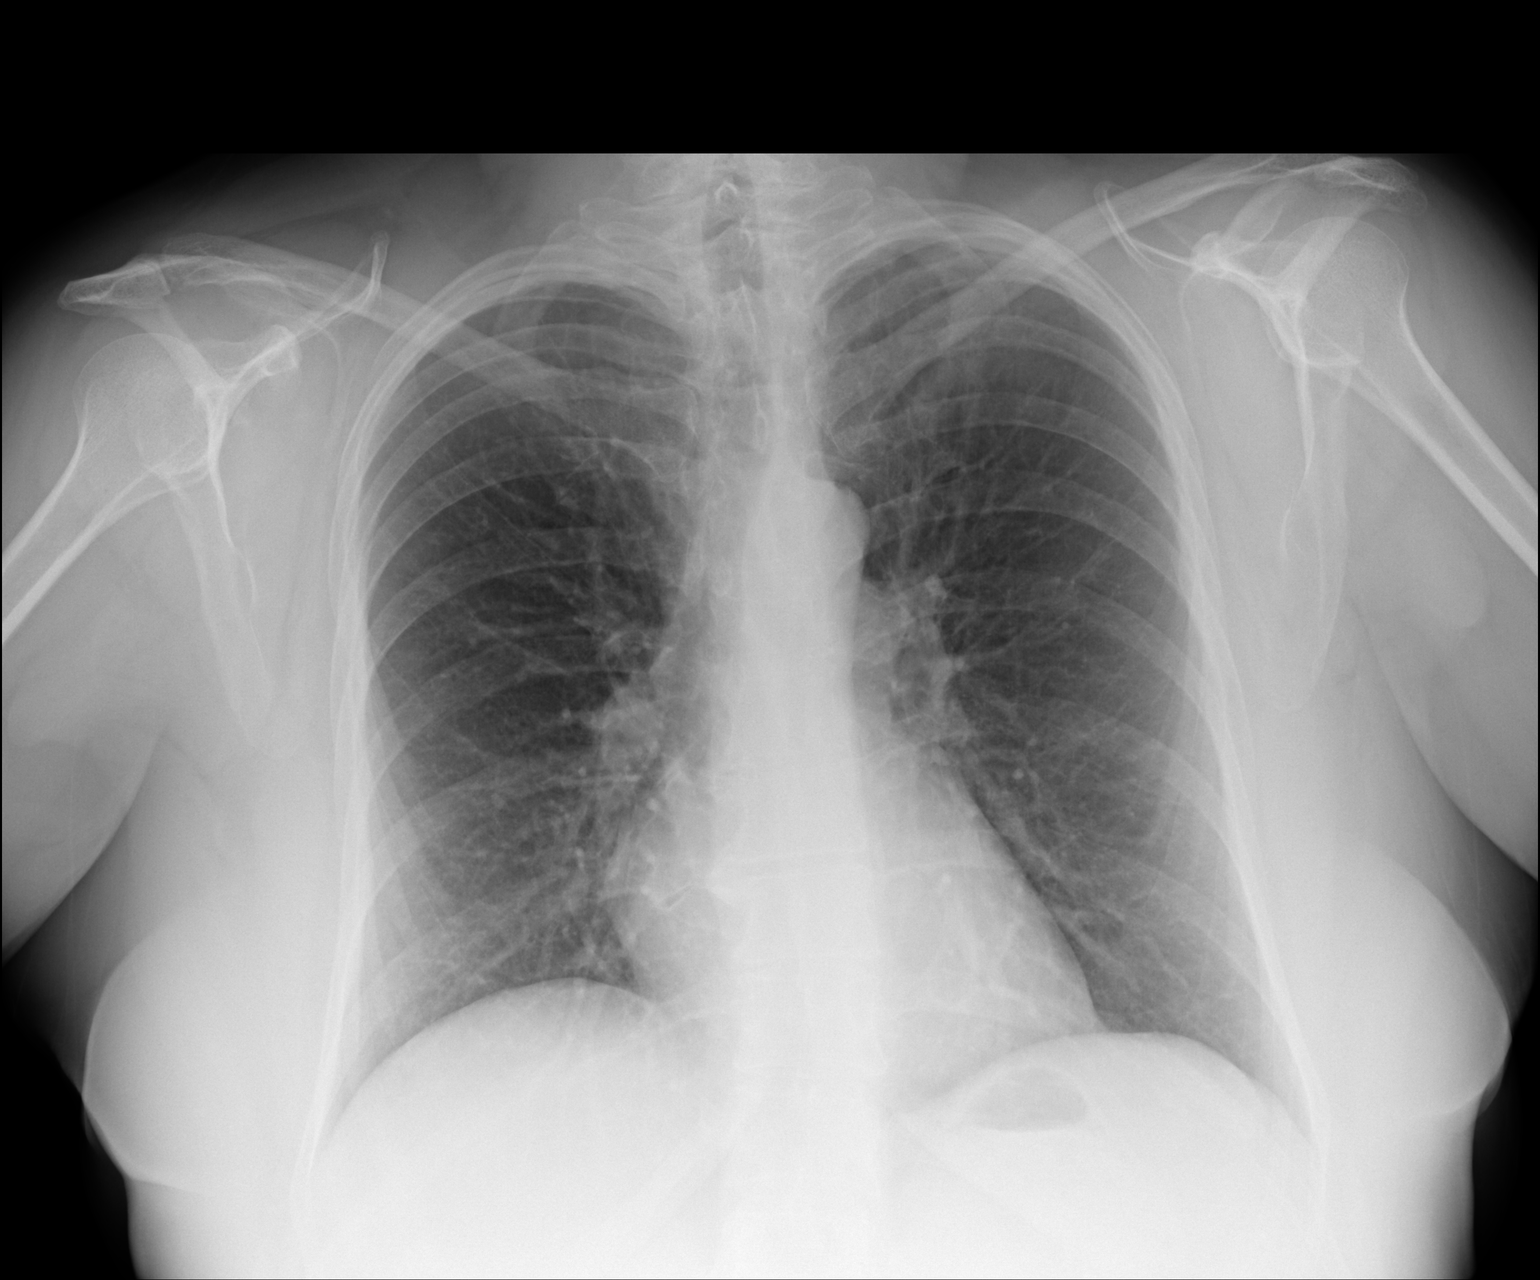

[dg chest 2 view (2 of 2)]
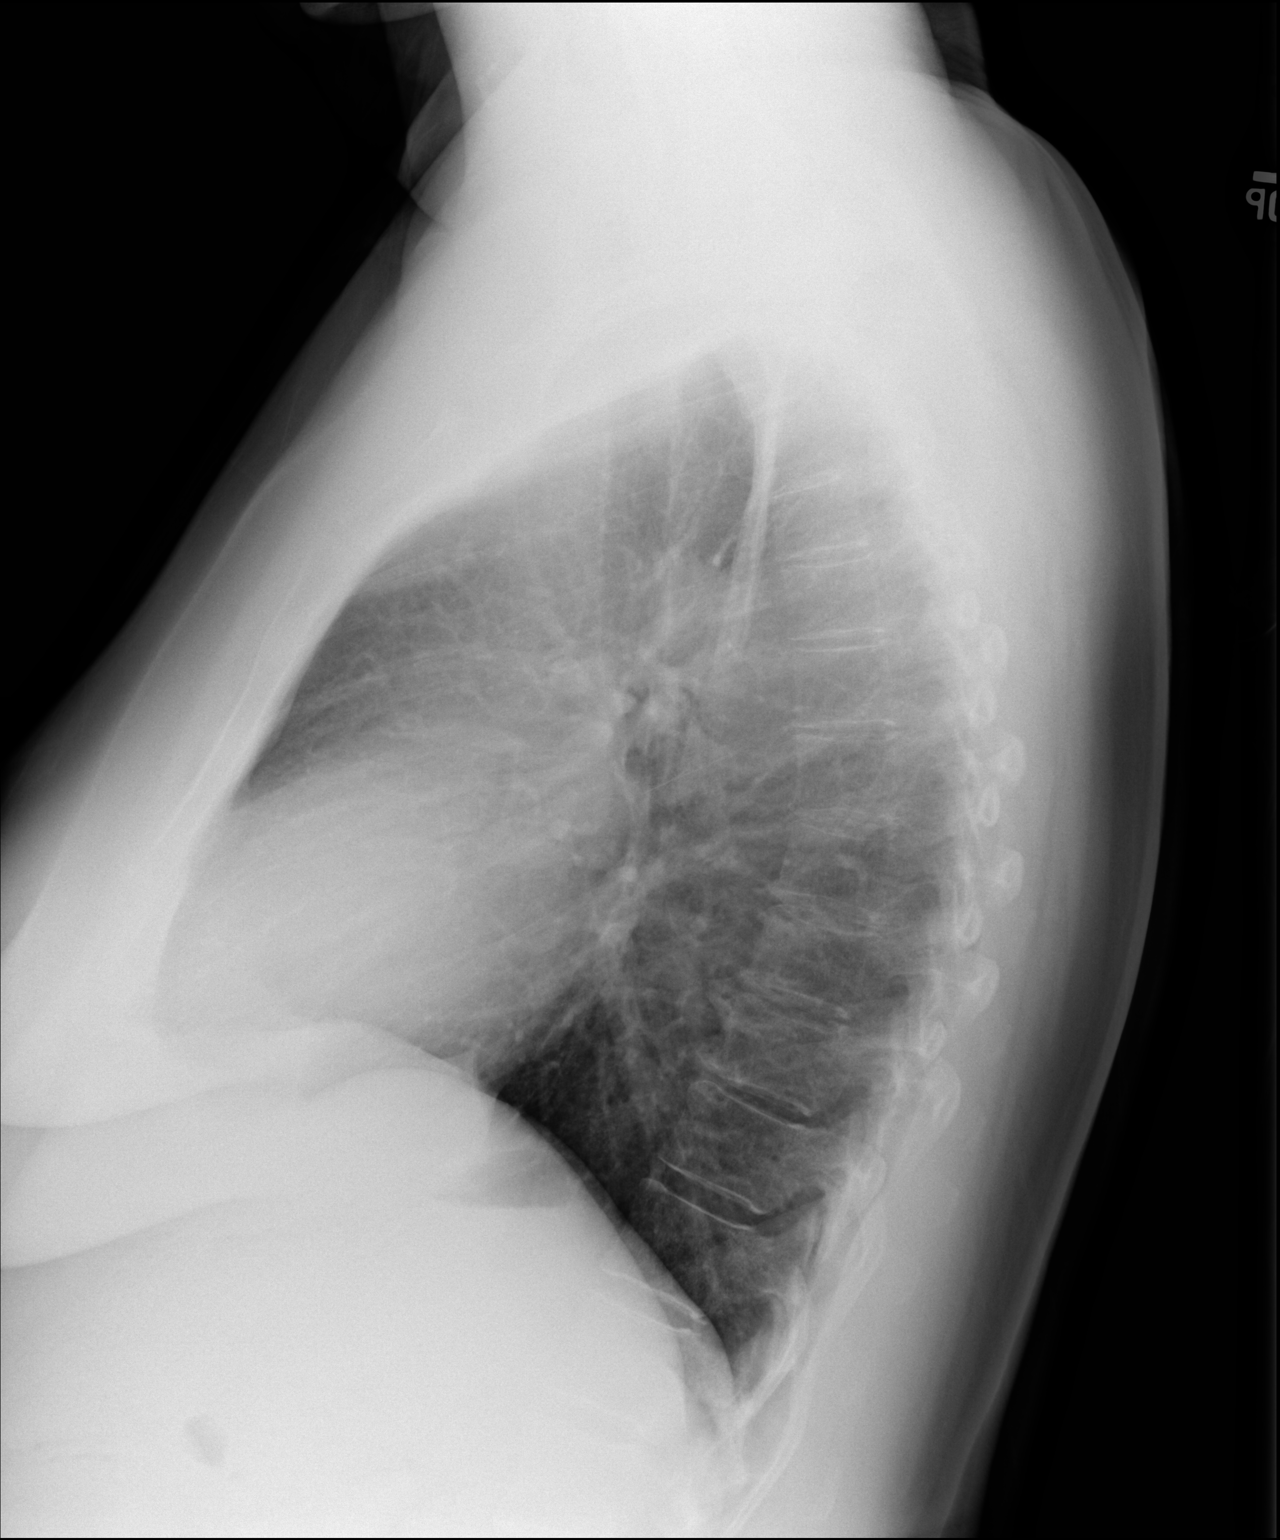

[2 of 2 positions shown; findings below may reference images not displayed]

FINDINGS: Heart and mediastinal contours are within normal limits. No focal
opacities or effusions. No acute bony abnormality.
IMPRESSION: No active cardiopulmonary disease.

## 2016-11-30 ENCOUNTER — Ambulatory Visit (INDEPENDENT_AMBULATORY_CARE_PROVIDER_SITE_OTHER): Payer: BLUE CROSS/BLUE SHIELD | Admitting: Pulmonary Disease

## 2016-11-30 ENCOUNTER — Encounter: Payer: Self-pay | Admitting: Pulmonary Disease

## 2016-11-30 VITALS — BP 116/70 | HR 60 | Ht 65.0 in | Wt 169.0 lb

## 2016-11-30 DIAGNOSIS — G4733 Obstructive sleep apnea (adult) (pediatric): Secondary | ICD-10-CM | POA: Diagnosis not present

## 2016-11-30 DIAGNOSIS — R0600 Dyspnea, unspecified: Secondary | ICD-10-CM | POA: Diagnosis not present

## 2016-11-30 NOTE — Assessment & Plan Note (Signed)
Lung function appears okay- no need for inhaler therapy Fear that this would make her anxiety worse  Use Mucinex 600 mg twice daily for 7 days for the lump in her throat feeling and then as needed

## 2016-11-30 NOTE — Patient Instructions (Signed)
Lung function appears okay- no need for inhaler therapy Keep using CPAP with nasal pillows -okay to stop using humidifier if not needed  Use Mucinex 600 mg twice daily for 7 days and then as needed

## 2016-11-30 NOTE — Assessment & Plan Note (Signed)
Continue using CPAP with nasal pillows The seems to be helping her symptoms of daytime somnolence and fatigue-download shows good control of events without significant leak and good compliance  We discussed simple sleep hygiene tips for insomnia. I advised her against using Benadryl and the long-term. Okay to use melatonin 10 mg 2 hours before bedtime

## 2016-11-30 NOTE — Progress Notes (Signed)
Subjective:    Patient ID: Joanne Sanchez, female    DOB: 02-18-1956, 61 y.o.   MRN: [id number]  HPI  Chief Complaint  Patient presents with  . Pulm Consult    Increased SOB and fatigue. Has a history of OSA and COPD. Uses a CPAP machine.     61 year old never smoker presents for evaluation of dyspnea. She reports shortness of breath and fatigue for many years but worse over the last few months. She underwent an extensive evaluation including blood work results did not show any evidence of anemia. She reports constant feeling of lump in her throat and cough productive of mucoid minimal sputum she underwent an endoscopy in 04/2016 which showed gastric polyps and a mucosal nodule in the esophagus. She also underwent sleep testing by her dentist which showed AHI of 23/hour on a home sleep study. Based on this she was started on CPAP with nasal pillows. She report some problems adjusting to the pillows interface but did not tolerate a mask at all. She's been using Benadryl at night to help her sleep, melatonin did not help her much-she feels that insomnia is also causing some of her problems with daytime fatigue.  Review of her download from her phone shows good compliance except in the last few days and good control of events with minimal leak.  She states that Mucinex helped somewhat, Benadryl helps her sleep better. She also reports dyspnea on exertion when walking long distances but denies wheezing or nocturnal symptoms  Spirometry showed ratio of 84, FEV1 of 82% and FVC of 75% without any evidence of airway obstruction. Echo from 2010 had shown normal LV function  She reports considerable anxiety which is generalized and not related to any specific trigger. She does report chronic back issues and muscle spasms in her back and legs which she attributes to lifting heavy weights when she worked at [employer]   Past Medical History:  Diagnosis Date  . Allergy   . Anemia   . GERD  (gastroesophageal reflux disease)   . Hyperlipidemia   . Hypertension   . Pre-diabetes   . Vertigo    Past Surgical History:  Procedure Laterality Date  . ABDOMINAL HYSTERECTOMY  2000   TAH/BSO  . WISDOM TOOTH EXTRACTION  1990's    Allergies  Allergen Reactions  . Iodinated Diagnostic Agents Hives and Swelling    Prior reaction to ivp contrast Patient pre-medicated w/ 13 hour prep prednisone and benadryl   . Lipitor [Atorvastatin] Other (See Comments)    MUSCLE CRAMPS  . Metformin And Related Diarrhea     Social History   Social History  . Marital status: Married    Spouse name: N/A  . Number of children: N/A  . Years of education: N/A   Occupational History  . unemployed    Social History Main Topics  . Smoking status: Never Smoker  . Smokeless tobacco: Never Used  . Alcohol use 0.0 oz/week     Comment: occasionally  . Drug use: No  . Sexual activity: Not on file   Other Topics Concern  . Not on file   Social History Narrative  . No narrative on file     Family History  Problem Relation Age of Onset  . Heart disease Mother   . Hypertension Mother   . Hyperlipidemia Mother   . Heart disease Father   . Hyperlipidemia Father   . Hypertension Father   . Atrial fibrillation Father   .  Hypertension Brother   . Hypertension Brother      Review of Systems Constitutional: negative for anorexia, fevers and sweats  Eyes: negative for irritation, redness and visual disturbance  Ears, nose, mouth, throat, and face: negative for earaches, epistaxis, nasal congestion and sore throat  Respiratory: negative for cough, dyspnea on exertion, sputum and wheezing  Cardiovascular: negative for chest pain, dyspnea, lower extremity edema, orthopnea, palpitations and syncope  Gastrointestinal: negative for abdominal pain, constipation, diarrhea, melena, nausea and vomiting  Genitourinary:negative for dysuria, frequency and hematuria  Hematologic/lymphatic: negative  for bleeding, easy bruising and lymphadenopathy  Musculoskeletal:negative for arthralgias, muscle weakness and stiff joints  Neurological: negative for coordination problems, gait problems, headaches and weakness  Endocrine: negative for diabetic symptoms including polydipsia, polyuria and weight loss     Objective:   Physical Exam  Gen. Pleasant, well-nourished, in no distress, normal affect ENT - no lesions, no post nasal drip Neck: No JVD, no thyromegaly, no carotid bruits Lungs: no use of accessory muscles, no dullness to percussion, clear without rales or rhonchi  Cardiovascular: Rhythm regular, heart sounds  normal, no murmurs or gallops, no peripheral edema Abdomen: soft and non-tender, no hepatosplenomegaly, BS normal. Musculoskeletal: No deformities, no cyanosis or clubbing Neuro:  alert, non focal       Assessment & Plan:

## 2016-12-02 DIAGNOSIS — G4733 Obstructive sleep apnea (adult) (pediatric): Secondary | ICD-10-CM | POA: Diagnosis not present

## 2016-12-31 ENCOUNTER — Encounter: Payer: Self-pay | Admitting: Adult Health

## 2016-12-31 ENCOUNTER — Ambulatory Visit (INDEPENDENT_AMBULATORY_CARE_PROVIDER_SITE_OTHER): Payer: BLUE CROSS/BLUE SHIELD | Admitting: Adult Health

## 2016-12-31 DIAGNOSIS — R058 Other specified cough: Secondary | ICD-10-CM | POA: Insufficient documentation

## 2016-12-31 DIAGNOSIS — J301 Allergic rhinitis due to pollen: Secondary | ICD-10-CM | POA: Diagnosis not present

## 2016-12-31 DIAGNOSIS — R05 Cough: Secondary | ICD-10-CM | POA: Diagnosis not present

## 2016-12-31 DIAGNOSIS — J309 Allergic rhinitis, unspecified: Secondary | ICD-10-CM | POA: Insufficient documentation

## 2016-12-31 NOTE — Patient Instructions (Addendum)
Begin Allegra 180mg  daily .  Saline nasal rinses As needed   May use Mucinex Twice daily  As needed  Cough/congestion  Delsym 2 tsp Twice daily  As needed  Cough .  Hold fish oil .  Discuss with your primary MD that Enalapril may make your cough worse.  Use of sips of water to soothe throat , avoid throat clearing . Avoid MInt products.  follow up Dr. Elsworth Soho  In 2 months and As needed   Please contact office for sooner follow up if symptoms do not improve or worsen or seek emergency care

## 2016-12-31 NOTE — Assessment & Plan Note (Signed)
[facility]  Plan  Patient Instructions  Begin Allegra 180mg  daily .  Saline nasal rinses As needed   May use Mucinex Twice daily  As needed  Cough/congestion  Delsym 2 tsp Twice daily  As needed  Cough .  Hold fish oil .  Discuss with your primary MD that Enalapril may make your cough worse.  Use of sips of water to soothe throat , avoid throat clearing . Avoid MInt products.

## 2016-12-31 NOTE — Progress Notes (Signed)
@Patient  ID: Joanne Sanchez, female    DOB: Nov 06, 1955, 61 y.o.   MRN: [id number]  Chief Complaint  Patient presents with  . Follow-up    cough     Referring provider: Maurice Small, MD  HPI: 61 yo female never smoker seen for pulmonary consult 11/30/16 for dyspnea.  PMH of OSA on CPAP   TEST  11/2016 Spirometry > showed ratio of 84, FEV1 of 82% and FVC of 75% without any evidence of airway obstruction. Echo from 2010 had shown normal LV function CXR clear 11/2016   12/31/2016 Follow up : Dyspnea  Pt returns for 1 month follow up . Seen last ov for pulmonary consult for dyspnea, cough and thick mucus and lump in her throat. Spirometry was normal . She was instructed to stop albuterol inhaler and benadryl . Marland Kitchen Started on mucinex Twice daily  .  Feels mucinex really helped. Still has cough and needs to clear throat everyday . Feels tickle in throat. Drainage at night. No fever.   On Ace inhibitor for many years.  Takes fish oil .  Does essential oil, topical . Not ingesting.   Has GERD on PPI.   SH: she is a Corporate treasurer in past, worked at [employer].  Never smoker . Works outside at house.  No drugs/etoh.  From [place].  No previous use of macrodantin /amiodarone.    Allergies  Allergen Reactions  . Iodinated Diagnostic Agents Hives and Swelling    Prior reaction to ivp contrast Patient pre-medicated w/ 13 hour prep prednisone and benadryl   . Lipitor [Atorvastatin] Other (See Comments)    MUSCLE CRAMPS  . Metformin And Related Diarrhea    Immunization History  Administered Date(s) Administered  . Influenza Split 07/28/2012, 09/23/2013, 09/05/2016  . Pneumococcal Polysaccharide-23 11/05/2000  . Tdap 05/27/2013    Past Medical History:  Diagnosis Date  . Allergy   . Anemia   . GERD (gastroesophageal reflux disease)   . Hyperlipidemia   . Hypertension   . Pre-diabetes   . Vertigo     Tobacco History: History  Smoking Status  . Never Smoker  Smokeless Tobacco  .  Never Used   Counseling given: Not Answered   Outpatient Encounter Prescriptions as of 12/31/2016  Medication Sig  . aspirin 81 MG tablet Take 81 mg by mouth. Two-three times a week  . Cholecalciferol (VITAMIN D-3) 5000 UNITS TABS Take 5,000 Units by mouth daily.  . enalapril (VASOTEC) 20 MG tablet Take 20 mg by mouth daily.  Marland Kitchen MAGNESIUM CHLORIDE-CALCIUM PO Take by mouth daily. Reported on 05/04/2016  . Omega 3-6-9 Fatty Acids (OMEGA 3-6-9 PO) Take by mouth.  . Omega-3 1000 MG CAPS Take by mouth.  Marland Kitchen omeprazole (PRILOSEC) 40 MG capsule Take 40 mg by mouth daily.  . pravastatin (PRAVACHOL) 10 MG tablet Take 10 mg by mouth daily.  . Probiotic Product (FORTIFY DAILY PROBIOTIC PO) Take by mouth.  Marland Kitchen albuterol (PROVENTIL HFA;VENTOLIN HFA) 108 (90 Base) MCG/ACT inhaler Inhale 2 puffs into the lungs every 4 (four) hours as needed for wheezing or shortness of breath.   No facility-administered encounter medications on file as of 12/31/2016.      Review of Systems  Constitutional:   No  weight loss, night sweats,  Fevers, chills, fatigue, or  lassitude.  HEENT:   No headaches,  Difficulty swallowing,  Tooth/dental problems, or  Sore throat,                No sneezing,  itching, ear ache,  +nasal congestion, post nasal drip,   CV:  No chest pain,  Orthopnea, PND, swelling in lower extremities, anasarca, dizziness, palpitations, syncope.   GI  No heartburn, indigestion, abdominal pain, nausea, vomiting, diarrhea, change in bowel habits, loss of appetite, bloody stools.   Resp: No shortness of breath with exertion or at rest.    No coughing up of blood.  No change in color of mucus.  No wheezing.  No chest wall deformity  Skin: no rash or lesions.  GU: no dysuria, change in color of urine, no urgency or frequency.  No flank pain, no hematuria   MS:  No joint pain or swelling.  No decreased range of motion.  No back pain.    Physical Exam  BP 134/64 (BP Location: Left Arm, Cuff Size:  Normal)   Pulse 69   Ht 5\' 5"  (1.651 m)   Wt 175 lb 9.6 oz (79.7 kg)   SpO2 99%   BMI 29.22 kg/m   GEN: A/Ox3; pleasant , NAD, well nourished    HEENT:  [place]/AT,  EACs-clear, TMs-wnl, NOSE-clear, THROAT-clear, no lesions, no postnasal drip or exudate noted.   NECK:  Supple w/ fair ROM; no JVD; normal carotid impulses w/o bruits; no thyromegaly or nodules palpated; no lymphadenopathy.    RESP  Clear  P & A; w/o, wheezes/ rales/ or rhonchi. no accessory muscle use, no dullness to percussion  CARD:  RRR, no m/r/g, no peripheral edema, pulses intact, no cyanosis or clubbing.  GI:   Soft & nt; nml bowel sounds; no organomegaly or masses detected.   Musco: Warm bil, no deformities or joint swelling noted.   Neuro: alert, no focal deficits noted.    Skin: Warm, no lesions or rashes    Lab Results:  CBC    Component Value Date/Time   WBC 9.4 09/23/2013 1554   RBC 4.31 09/23/2013 1554   HGB 13.5 09/23/2013 1554   HCT 39.3 09/23/2013 1554   PLT 264 09/23/2013 1554   MCV 91.2 09/23/2013 1554   MCH 31.3 09/23/2013 1554   MCHC 34.4 09/23/2013 1554   RDW 12.5 09/23/2013 1554   LYMPHSABS 3.6 09/23/2013 1554   MONOABS 0.8 09/23/2013 1554   EOSABS 0.2 09/23/2013 1554   BASOSABS 0.1 09/23/2013 1554    BMET    Component Value Date/Time   NA 140 09/23/2013 1554   K 4.4 09/23/2013 1554   CL 103 09/23/2013 1554   CO2 28 09/23/2013 1554   GLUCOSE 97 09/23/2013 1554   BUN 19 09/23/2013 1554   CREATININE 0.80 09/23/2013 1554   CALCIUM 9.8 09/23/2013 1554   GFRNONAA 81 09/23/2013 1554   GFRAA >89 09/23/2013 1554    BNP No results found for: BNP  ProBNP No results found for: PROBNP  Imaging: No results found.   Assessment & Plan:   Upper airway cough syndrome UACS w/ Rhinitis  ? ACE aggravating cough .   Plan  Patient Instructions  Begin Allegra 180mg  daily .  Saline nasal rinses As needed   May use Mucinex Twice daily  As needed  Cough/congestion  Delsym 2 tsp  Twice daily  As needed  Cough .  Hold fish oil .  Discuss with your primary MD that Enalapril may make your cough worse.  Use of sips of water to soothe throat , avoid throat clearing . Avoid MInt products.      Allergic rhinitis Flare  Plan  Patient Instructions  Begin Allegra 180mg   daily .  Saline nasal rinses As needed   May use Mucinex Twice daily  As needed  Cough/congestion  Delsym 2 tsp Twice daily  As needed  Cough .  Hold fish oil .  Discuss with your primary MD that Enalapril may make your cough worse.  Use of sips of water to soothe throat , avoid throat clearing . Avoid MInt products.         Rexene Edison, NP 12/31/2016

## 2016-12-31 NOTE — Assessment & Plan Note (Signed)
UACS w/ Rhinitis  ? ACE aggravating cough .   Plan  Patient Instructions  Begin Allegra 180mg  daily .  Saline nasal rinses As needed   May use Mucinex Twice daily  As needed  Cough/congestion  Delsym 2 tsp Twice daily  As needed  Cough .  Hold fish oil .  Discuss with your primary MD that Enalapril may make your cough worse.  Use of sips of water to soothe throat , avoid throat clearing . Avoid MInt products.

## 2017-01-02 DIAGNOSIS — G4733 Obstructive sleep apnea (adult) (pediatric): Secondary | ICD-10-CM | POA: Diagnosis not present

## 2017-01-07 NOTE — Progress Notes (Signed)
Reviewed & agree with plan  

## 2017-01-11 ENCOUNTER — Telehealth: Payer: Self-pay | Admitting: Adult Health

## 2017-01-11 NOTE — Telephone Encounter (Signed)
Spoke with pt. Advised her that TP wanted her to start [place]. Pt has been made aware that this medication is OTC. Nothing further was needed.

## 2017-01-11 NOTE — Telephone Encounter (Signed)
l °

## 2017-01-21 ENCOUNTER — Telehealth: Payer: Self-pay | Admitting: Pulmonary Disease

## 2017-01-21 NOTE — Telephone Encounter (Signed)
Mucinex 600 twice a day Z-Pak If worse I can see her tomorrow afternoon

## 2017-01-21 NOTE — Telephone Encounter (Signed)
Dr. Elsworth Soho  Please Advise-  Pt called in c/o she notices that she has been wheezing more frequently and when she cough she is getting up thick phlegm that is a pinkish color with some blood in it. Lots of chest congestion. Denies fever, any other breathing concerns. Wanted to do you recommend she takes. Should she take mucinex to help or something else.   Allergies  Allergen Reactions  . Iodinated Diagnostic Agents Hives and Swelling    Prior reaction to ivp contrast Patient pre-medicated w/ 13 hour prep prednisone and benadryl   . Lipitor [Atorvastatin] Other (See Comments)    MUSCLE CRAMPS  . Metformin And Related Diarrhea

## 2017-01-21 NOTE — Telephone Encounter (Signed)
LMTCB

## 2017-01-22 MED ORDER — AZITHROMYCIN 250 MG PO TABS: ORAL_TABLET | ORAL | 0 refills | Status: DC

## 2017-01-22 MED ORDER — GUAIFENESIN ER 600 MG PO TB12: 600.0000 mg | ORAL_TABLET | Freq: Two times a day (BID) | ORAL | 0 refills | Status: DC

## 2017-01-22 NOTE — Telephone Encounter (Signed)
Patient is returning phone call.  °

## 2017-01-22 NOTE — Telephone Encounter (Signed)
Spoke with pt and informed her of Dr. Bari Mantis message. Pt understood and agreed to the medication being called in. The rx was sent nothing further is needed

## 2017-01-29 DIAGNOSIS — Z5181 Encounter for therapeutic drug level monitoring: Secondary | ICD-10-CM | POA: Diagnosis not present

## 2017-01-30 ENCOUNTER — Encounter: Payer: Self-pay | Admitting: Gastroenterology

## 2017-01-30 DIAGNOSIS — G4733 Obstructive sleep apnea (adult) (pediatric): Secondary | ICD-10-CM | POA: Diagnosis not present

## 2017-02-12 ENCOUNTER — Other Ambulatory Visit: Payer: Self-pay | Admitting: Family Medicine

## 2017-02-12 DIAGNOSIS — Z1231 Encounter for screening mammogram for malignant neoplasm of breast: Secondary | ICD-10-CM

## 2017-02-14 DIAGNOSIS — R7301 Impaired fasting glucose: Secondary | ICD-10-CM | POA: Diagnosis not present

## 2017-02-20 DIAGNOSIS — E785 Hyperlipidemia, unspecified: Secondary | ICD-10-CM | POA: Diagnosis not present

## 2017-02-20 DIAGNOSIS — E559 Vitamin D deficiency, unspecified: Secondary | ICD-10-CM | POA: Diagnosis not present

## 2017-02-20 DIAGNOSIS — Z0000 Encounter for general adult medical examination without abnormal findings: Secondary | ICD-10-CM | POA: Diagnosis not present

## 2017-02-22 DIAGNOSIS — E559 Vitamin D deficiency, unspecified: Secondary | ICD-10-CM | POA: Diagnosis not present

## 2017-02-22 DIAGNOSIS — E785 Hyperlipidemia, unspecified: Secondary | ICD-10-CM | POA: Diagnosis not present

## 2017-02-22 DIAGNOSIS — Z23 Encounter for immunization: Secondary | ICD-10-CM | POA: Diagnosis not present

## 2017-03-02 DIAGNOSIS — G4733 Obstructive sleep apnea (adult) (pediatric): Secondary | ICD-10-CM | POA: Diagnosis not present

## 2017-03-18 ENCOUNTER — Ambulatory Visit (INDEPENDENT_AMBULATORY_CARE_PROVIDER_SITE_OTHER): Payer: BLUE CROSS/BLUE SHIELD | Admitting: Pulmonary Disease

## 2017-03-18 ENCOUNTER — Encounter: Payer: Self-pay | Admitting: Pulmonary Disease

## 2017-03-18 ENCOUNTER — Ambulatory Visit: Payer: BLUE CROSS/BLUE SHIELD

## 2017-03-18 DIAGNOSIS — R058 Other specified cough: Secondary | ICD-10-CM

## 2017-03-18 DIAGNOSIS — G4733 Obstructive sleep apnea (adult) (pediatric): Secondary | ICD-10-CM

## 2017-03-18 DIAGNOSIS — R05 Cough: Secondary | ICD-10-CM

## 2017-03-18 NOTE — Progress Notes (Signed)
   Subjective:    Patient ID: Joanne Sanchez, female    DOB: 11/23/1955, 61 y.o.   MRN: [id number]  HPI  61 year old never smoker for FU of Upper airway cough syndrome & OSA.  Her coughing is much improved. She continues to have occasional postnasal drip for which Mucinex seems to help. She was taking a lot of Benadryl before and has stopped this and is asking for an alternative medication Enalapril was stopped in 01/2017-she does not think that this impacted it much She called in 01/2017 and was given Z-Pak and this really helped her cough  She wonders if she needs to continue using her CPAP machine. This has helped her daytime somnolence and fatigue      Significant tests/ events reviewed  Spirometry 12/2016 ratio of 84, FEV1 of 82% and FVC of 75% without any evidence of airway obstruction. Echo from 2010 had shown normal LV function  EGD 04/2016 which showed gastric polyps and a mucosal nodule in the esophagus.   08/2016 >>HST  [name], dentist) AHI of 23/hour      Review of Systems neg for any significant sore throat, dysphagia, itching, sneezing, nasal congestion or excess/ purulent secretions, fever, chills, sweats, unintended wt loss, pleuritic or exertional cp, hempoptysis, orthopnea pnd or change in chronic leg swelling. Also denies presyncope, palpitations, heartburn, abdominal pain, nausea, vomiting, diarrhea or change in bowel or urinary habits, dysuria,hematuria, rash, arthralgias, visual complaints, headache, numbness weakness or ataxia.     Objective:   Physical Exam  Gen. Pleasant,  in no distress ENT - class 2 airway, no post nasal drip Neck: No JVD, no thyromegaly, no carotid bruits Lungs: no use of accessory muscles, no dullness to percussion, decreased without rales or rhonchi  Cardiovascular: Rhythm regular, heart sounds  normal, no murmurs or gallops, no peripheral edema Musculoskeletal: No deformities, no cyanosis or clubbing , no tremors         Assessment & Plan:

## 2017-03-18 NOTE — Assessment & Plan Note (Signed)
Take Chlor-Trimeton 4 mg at bedtime if postnasal drip is excessive especially during allergy season  Okay to take Mucinex 600 twice daily for 1 week when you have a flare of

## 2017-03-18 NOTE — Assessment & Plan Note (Signed)
We will consider repeating sleep study once you lose about 10 pounds  Obtain CPAP download

## 2017-03-18 NOTE — Patient Instructions (Signed)
Take Chlor-Trimeton 4 mg at bedtime if postnasal drip is excessive especially during allergy season  Okay to take Mucinex 600 twice daily for 1 week when you have a flare of  We will consider repeating sleep study once you lose about 10 pounds

## 2017-04-01 DIAGNOSIS — G4733 Obstructive sleep apnea (adult) (pediatric): Secondary | ICD-10-CM | POA: Diagnosis not present

## 2017-04-04 ENCOUNTER — Ambulatory Visit
Admission: RE | Admit: 2017-04-04 | Discharge: 2017-04-04 | Disposition: A | Discharge status: Home / Self Care | Payer: BLUE CROSS/BLUE SHIELD | Source: Ambulatory Visit | Attending: Family Medicine | Admitting: Family Medicine

## 2017-04-04 DIAGNOSIS — Z1231 Encounter for screening mammogram for malignant neoplasm of breast: Secondary | ICD-10-CM

## 2017-05-02 DIAGNOSIS — G4733 Obstructive sleep apnea (adult) (pediatric): Secondary | ICD-10-CM | POA: Diagnosis not present

## 2017-06-01 DIAGNOSIS — G4733 Obstructive sleep apnea (adult) (pediatric): Secondary | ICD-10-CM | POA: Diagnosis not present

## 2017-06-11 DIAGNOSIS — G4733 Obstructive sleep apnea (adult) (pediatric): Secondary | ICD-10-CM | POA: Diagnosis not present

## 2017-07-25 DIAGNOSIS — Z23 Encounter for immunization: Secondary | ICD-10-CM | POA: Diagnosis not present

## 2017-09-10 DIAGNOSIS — J069 Acute upper respiratory infection, unspecified: Secondary | ICD-10-CM | POA: Diagnosis not present

## 2017-09-16 ENCOUNTER — Encounter: Payer: Self-pay | Admitting: Adult Health

## 2017-09-16 ENCOUNTER — Ambulatory Visit: Payer: BLUE CROSS/BLUE SHIELD | Admitting: Adult Health

## 2017-09-16 DIAGNOSIS — R058 Other specified cough: Secondary | ICD-10-CM

## 2017-09-16 DIAGNOSIS — K219 Gastro-esophageal reflux disease without esophagitis: Secondary | ICD-10-CM

## 2017-09-16 DIAGNOSIS — Z23 Encounter for immunization: Secondary | ICD-10-CM

## 2017-09-16 DIAGNOSIS — G4733 Obstructive sleep apnea (adult) (pediatric): Secondary | ICD-10-CM | POA: Diagnosis not present

## 2017-09-16 DIAGNOSIS — R05 Cough: Secondary | ICD-10-CM

## 2017-09-16 DIAGNOSIS — J309 Allergic rhinitis, unspecified: Secondary | ICD-10-CM | POA: Diagnosis not present

## 2017-09-16 NOTE — Patient Instructions (Addendum)
Begin Zyrtec 10mg  At bedtime  .  Saline nasal spray Twice daily  .  Saline nasal gel At bedtime  .  Begin Flonase 2 puffs daily .  May use Mucinex Twice daily  As needed  Cough/congestion  Delsym 2 tsp Twice daily  As needed  Cough .  Use of sips of water to soothe throat , avoid throat clearing . Avoid MInt products.  Restart CPAP At bedtime   Try to wear for 4hr each night.  Follow up Dr. Elsworth Soho  In 3 months and As needed   Please contact office for sooner follow up if symptoms do not improve or worsen or seek emergency care  Flu shot today .

## 2017-09-16 NOTE — Assessment & Plan Note (Signed)
Cont on PPI

## 2017-09-16 NOTE — Progress Notes (Signed)
@Patient  ID: Joanne Sanchez, female    DOB: [birth year], 61 y.o.   MRN: [id number]  Chief Complaint  Patient presents with  . Follow-up    chronic cough     Referring provider: Maurice Small, MD  HPI: 61 yo female never smoker seen for pulmonary consult 11/30/16 for dyspnea.  PMH of OSA on CPAP  Retired LPN   TEST  04/4402 Spirometry > showed ratio of 84, FEV1 of 82% and FVC of 75% without any evidence of airway obstruction. Echo from 2010 had shown normal LV function CXR clear 11/2016  EGD 04/2016 which showed gastric polyps and a mucosal nodule in the esophagus.  08/2016 >>HST  [name], dentist) AHI of 23/hour     09/16/2017 Follow up : Cough and OSA  Pt returns for 6 month follow up . She has chronic cough , ACE inhibitor was stopped in Feb 2018 . She was placed Chlortrimeton and mucinex . And PPI . She says her daily cough has resolved but still has lots of sinus drainage at times. Feels the mucus sticks in her throat.  She denies any chest pain orthopnea PND or leg swelling.  Says she had a recent bronchitis and sinus infection . She is finished her antibiotic yesterday and is feeling much better.  She was exposed to her grandchild that has a URI. Would like to get flu shot today .   She has OSA but has not been wearing her CPAP due to sinus drainage and congestion  We discussed importance of compliance. Pt education on OSA and potential complication of untreated OSA .  Does have daytime sleepiness and fatigue.     Allergies  Allergen Reactions  . Iodinated Diagnostic Agents Hives and Swelling    Prior reaction to ivp contrast Patient pre-medicated w/ 13 hour prep prednisone and benadryl   . Lipitor [Atorvastatin] Other (See Comments)    MUSCLE CRAMPS  . Metformin And Related Diarrhea    Immunization History  Administered Date(s) Administered  . Influenza Split 07/28/2012, 09/23/2013, 09/05/2016  . Pneumococcal Polysaccharide-23 11/05/2000  . Tdap 05/27/2013      Past Medical History:  Diagnosis Date  . Allergy   . Anemia   . GERD (gastroesophageal reflux disease)   . Hyperlipidemia   . Hypertension   . Pre-diabetes   . Vertigo     Tobacco History: Social History   Tobacco Use  Smoking Status Never Smoker  Smokeless Tobacco Never Used   Counseling given: Not Answered   Outpatient Encounter Medications as of 09/16/2017  Medication Sig  . amLODipine (NORVASC) 5 MG tablet Take 5 mg by mouth daily.  Marland Kitchen aspirin 81 MG tablet Take 81 mg by mouth. Two-three times a week  . Cholecalciferol (VITAMIN D-3) 5000 UNITS TABS Take 5,000 Units by mouth daily.  Marland Kitchen guaiFENesin (MUCINEX) 600 MG 12 hr tablet Take 1 tablet (600 mg total) by mouth 2 (two) times daily.  Marland Kitchen MAGNESIUM CHLORIDE-CALCIUM PO Take by mouth daily. Reported on 05/04/2016  . omeprazole (PRILOSEC) 40 MG capsule Take 40 mg by mouth daily.  . pravastatin (PRAVACHOL) 10 MG tablet Take 10 mg by mouth daily.  . Omega-3 1000 MG CAPS Take by mouth.   No facility-administered encounter medications on file as of 09/16/2017.      Review of Systems  Constitutional:   No  weight loss, night sweats,  Fevers, chills, + fatigue, or  lassitude.  HEENT:   No headaches,  Difficulty swallowing,  Tooth/dental problems, or  Sore throat,                No sneezing, itching, ear ache, _+ nasal congestion, post nasal drip,   CV:  No chest pain,  Orthopnea, PND, swelling in lower extremities, anasarca, dizziness, palpitations, syncope.   GI  No heartburn, indigestion, abdominal pain, nausea, vomiting, diarrhea, change in bowel habits, loss of appetite, bloody stools.   Resp: No shortness of breath with exertion or at rest.  No excess mucus, no productive cough,  No non-productive cough,  No coughing up of blood.  No change in color of mucus.  No wheezing.  No chest wall deformity  Skin: no rash or lesions.  GU: no dysuria, change in color of urine, no urgency or frequency.  No flank pain, no  hematuria   MS:  No joint pain or swelling.  No decreased range of motion.  No back pain.    Physical Exam  BP 118/78 (BP Location: Left Arm, Cuff Size: Normal)   Pulse 75   Ht 5\' 6"  (1.676 m)   Wt 174 lb (78.9 kg)   SpO2 97%   BMI 28.08 kg/m   GEN: A/Ox3; pleasant , NAD, well nourished    HEENT:  [place]/AT,  EACs-clear, TMs-wnl, NOSE-clear, THROAT-clear, no lesions, no postnasal drip or exudate noted. Class 2-3 MP airway   NECK:  Supple w/ fair ROM; no JVD; normal carotid impulses w/o bruits; no thyromegaly or nodules palpated; no lymphadenopathy.    RESP  Clear  P & A; w/o, wheezes/ rales/ or rhonchi. no accessory muscle use, no dullness to percussion  CARD:  RRR, no m/r/g, no peripheral edema, pulses intact, no cyanosis or clubbing.  GI:   Soft & nt; nml bowel sounds; no organomegaly or masses detected.   Musco: Warm bil, no deformities or joint swelling noted.   Neuro: alert, no focal deficits noted.    Skin: Warm, no lesions or rashes    Lab Results:  CBC  BNP No results found for: BNP  ProBNP No results found for: PROBNP  Imaging: No results found.   Assessment & Plan:   OSA (obstructive sleep apnea) Poor compliance due to sinus sx   Plan  Patient Instructions  Begin Zyrtec 10mg  At bedtime  .  Saline nasal spray Twice daily  .  Saline nasal gel At bedtime  .  Begin Flonase 2 puffs daily .  May use Mucinex Twice daily  As needed  Cough/congestion  Delsym 2 tsp Twice daily  As needed  Cough .  Use of sips of water to soothe throat , avoid throat clearing . Avoid MInt products.  Restart CPAP At bedtime   Try to wear for 4hr each night.  Follow up Dr. Elsworth Soho  In 3 months and As needed   Please contact office for sooner follow up if symptoms do not improve or worsen or seek emergency care  Flu shot today .     Allergic rhinitis Persistent symptoms   Plan  Add zyrtec , flonase and saline rinses .    GERD (gastroesophageal reflux disease) Cont  on PPI   Upper airway cough syndrome IMproved off ACE but control for triggers of GERD and AR .   Plan  Patient Instructions  Begin Zyrtec 10mg  At bedtime  .  Saline nasal spray Twice daily  .  Saline nasal gel At bedtime  .  Begin Flonase 2 puffs daily .  May use Mucinex Twice daily  As needed  Cough/congestion  Delsym 2 tsp Twice daily  As needed  Cough .  Use of sips of water to soothe throat , avoid throat clearing . Avoid MInt products.  Restart CPAP At bedtime   Try to wear for 4hr each night.  Follow up Dr. Elsworth Soho  In 3 months and As needed   Please contact office for sooner follow up if symptoms do not improve or worsen or seek emergency care  Flu shot today .        Rexene Edison, NP 09/16/2017

## 2017-09-16 NOTE — Assessment & Plan Note (Signed)
Poor compliance due to sinus sx   Plan  Patient Instructions  Begin Zyrtec 10mg  At bedtime  .  Saline nasal spray Twice daily  .  Saline nasal gel At bedtime  .  Begin Flonase 2 puffs daily .  May use Mucinex Twice daily  As needed  Cough/congestion  Delsym 2 tsp Twice daily  As needed  Cough .  Use of sips of water to soothe throat , avoid throat clearing . Avoid MInt products.  Restart CPAP At bedtime   Try to wear for 4hr each night.  Follow up Dr. Elsworth Soho  In 3 months and As needed   Please contact office for sooner follow up if symptoms do not improve or worsen or seek emergency care  Flu shot today .

## 2017-09-16 NOTE — Assessment & Plan Note (Signed)
IMproved off ACE but control for triggers of GERD and AR .   Plan  Patient Instructions  Begin Zyrtec 10mg  At bedtime  .  Saline nasal spray Twice daily  .  Saline nasal gel At bedtime  .  Begin Flonase 2 puffs daily .  May use Mucinex Twice daily  As needed  Cough/congestion  Delsym 2 tsp Twice daily  As needed  Cough .  Use of sips of water to soothe throat , avoid throat clearing . Avoid MInt products.  Restart CPAP At bedtime   Try to wear for 4hr each night.  Follow up Dr. Elsworth Soho  In 3 months and As needed   Please contact office for sooner follow up if symptoms do not improve or worsen or seek emergency care  Flu shot today .

## 2017-09-16 NOTE — Assessment & Plan Note (Signed)
Persistent symptoms   Plan  Add zyrtec , flonase and saline rinses .

## 2017-09-20 NOTE — Progress Notes (Signed)
Reviewed & agree with plan  

## 2017-09-30 ENCOUNTER — Ambulatory Visit: Payer: BLUE CROSS/BLUE SHIELD | Admitting: Adult Health

## 2017-09-30 ENCOUNTER — Encounter: Payer: Self-pay | Admitting: Adult Health

## 2017-09-30 DIAGNOSIS — J45909 Unspecified asthma, uncomplicated: Secondary | ICD-10-CM | POA: Insufficient documentation

## 2017-09-30 DIAGNOSIS — J4531 Mild persistent asthma with (acute) exacerbation: Secondary | ICD-10-CM | POA: Diagnosis not present

## 2017-09-30 DIAGNOSIS — J301 Allergic rhinitis due to pollen: Secondary | ICD-10-CM | POA: Diagnosis not present

## 2017-09-30 NOTE — Patient Instructions (Addendum)
Chest xray  Today  Augmentin 875mg  Twice daily  For 10 days . Take with food.  Prednisone taper over next week  Begin Symbicort 80/4.96mcg 2 puffs Twice daily  . Rinse after use.  May use Zyrtec 10mg  At bedtime  . As needed  Drainage.  Saline nasal spray Twice daily  .  Saline nasal gel At bedtime  .  Flonase 2 puffs daily  Use Mucinex Twice daily  As needed  Cough/congestion  Delsym 2 tsp Twice daily  As needed  Cough .  Follow up with Dr. Elsworth Soho  In 4 weeks and As needed   Please contact office for sooner follow up if symptoms do not improve or worsen or seek emergency care

## 2017-09-30 NOTE — Assessment & Plan Note (Signed)
Use flonase , saline and zyrtec

## 2017-09-30 NOTE — Assessment & Plan Note (Signed)
Exacerbation with bronchitis and sinusitis  xopenex neb x 1  Unable to do FENO   Plan  Patient Instructions  Chest xray  Today  Augmentin 875mg  Twice daily  For 10 days . Take with food.  Prednisone taper over next week  Begin Symbicort 80/4.51mcg 2 puffs Twice daily  . Rinse after use.  May use Zyrtec 10mg  At bedtime  . As needed  Drainage.  Saline nasal spray Twice daily  .  Saline nasal gel At bedtime  .  Flonase 2 puffs daily  Use Mucinex Twice daily  As needed  Cough/congestion  Delsym 2 tsp Twice daily  As needed  Cough .  Follow up with Dr. Elsworth Soho  In 4 weeks and As needed   Please contact office for sooner follow up if symptoms do not improve or worsen or seek emergency care

## 2017-09-30 NOTE — Progress Notes (Signed)
@Patient  ID: Aurelio Brash, female    DOB: [birth year], 61 y.o.   MRN: [id number]  Chief Complaint  Patient presents with  . Acute Visit    Cough     Referring provider: Maurice Small, MD  HPI: 61 yo female never smoker seen for pulmonary consult 11/30/16 for dyspnea.  PMH of OSA on CPAP  Retired LPN   TEST Joya San  04/8340 Spirometry >showed ratio of 84, FEV1 of 82% and FVC of 75% without any evidence of airway obstruction. Echo from 2010 had shown normal LV function CXR clear 11/2016 ACE inhibitor stopped 12/2016  EGD6/2017 which showed gastric polyps and a mucosal nodule in the esophagus.  08/2016 >>HST [name], dentist)AHI of 23/hour   09/30/2017 Acute OV : Cough  Patient presents for an acute office visit.  Patient complains over the last few weeks she has had persistent cough congestion sinus pain pressure and intermittent wheezing.  Last visit she was recommended to use Flonase Zyrtec and saline nasal sprays.  Patient says it helped for short period time but then symptoms returned and have been progressively getting worse.  She is blowing out thick yellow mucus and coughing up thick yellow mucus.  She denies any fever hemoptysis chest pain orthopnea PND or leg swelling. Appetite is good with no nausea vomiting diarrhea.  Could not complete FENO testing.     Allergies  Allergen Reactions  . Iodinated Diagnostic Agents Hives and Swelling    Prior reaction to ivp contrast Patient pre-medicated w/ 13 hour prep prednisone and benadryl   . Lipitor [Atorvastatin] Other (See Comments)    MUSCLE CRAMPS  . Metformin And Related Diarrhea    Immunization History  Administered Date(s) Administered  . Influenza Split 07/28/2012, 09/23/2013, 09/05/2016  . Influenza,inj,Quad PF,6+ Mos 09/16/2017  . Pneumococcal Polysaccharide-23 11/05/2000  . Tdap 05/27/2013    Past Medical History:  Diagnosis Date  . Allergy   . Anemia   . GERD (gastroesophageal reflux disease)     . Hyperlipidemia   . Hypertension   . Pre-diabetes   . Vertigo     Tobacco History: Social History   Tobacco Use  Smoking Status Never Smoker  Smokeless Tobacco Never Used   Counseling given: Not Answered   Outpatient Encounter Medications as of 09/30/2017  Medication Sig  . amLODipine (NORVASC) 5 MG tablet Take 5 mg by mouth daily.  Marland Kitchen aspirin 81 MG tablet Take 81 mg by mouth. Two-three times a week  . cetirizine (ZYRTEC) 10 MG tablet Take 10 mg at bedtime by mouth.  . Cholecalciferol (VITAMIN D-3) 5000 UNITS TABS Take 5,000 Units by mouth daily.  . fluticasone (FLONASE) 50 MCG/ACT nasal spray Place 2 sprays daily into both nostrils.  Marland Kitchen guaiFENesin (MUCINEX) 600 MG 12 hr tablet Take 1 tablet (600 mg total) by mouth 2 (two) times daily.  Marland Kitchen MAGNESIUM CHLORIDE-CALCIUM PO Take by mouth daily. Reported on 05/04/2016  . Omega-3 1000 MG CAPS Take by mouth.  Marland Kitchen omeprazole (PRILOSEC) 40 MG capsule Take 40 mg by mouth daily.  . pravastatin (PRAVACHOL) 10 MG tablet Take 10 mg by mouth daily.   No facility-administered encounter medications on file as of 09/30/2017.      Review of Systems  Constitutional:   No  weight loss, night sweats,  Fevers, chills, fatigue, or  lassitude.  HEENT:   No headaches,  Difficulty swallowing,  Tooth/dental problems, or  Sore throat,  No sneezing, itching, ear ache,  +nasal congestion, post nasal drip,   CV:  No chest pain,  Orthopnea, PND, swelling in lower extremities, anasarca, dizziness, palpitations, syncope.   GI  No heartburn, indigestion, abdominal pain, nausea, vomiting, diarrhea, change in bowel habits, loss of appetite, bloody stools.   Resp:   No chest wall deformity  Skin: no rash or lesions.  GU: no dysuria, change in color of urine, no urgency or frequency.  No flank pain, no hematuria   MS:  No joint pain or swelling.  No decreased range of motion.  No back pain.    Physical Exam  BP 122/74 (BP Location: Left  Arm, Cuff Size: Normal)   Pulse 70   Temp 98.5 F (36.9 C) (Oral)   Ht 5\' 6"  (1.676 m)   Wt 172 lb (78 kg)   SpO2 98%   BMI 27.76 kg/m   GEN: A/Ox3; pleasant , NAD,elderly    HEENT:  [place]/AT,  EACs-clear, TMs-wnl, NOSE-clear drainage, max tenderness , THROAT-clear, no lesions, no postnasal drip or exudate noted.   NECK:  Supple w/ fair ROM; no JVD; normal carotid impulses w/o bruits; no thyromegaly or nodules palpated; no lymphadenopathy.    RESP  Few trace exp wheezes , speaks in full sentences , no accessory muscle use, no dullness to percussion  CARD:  RRR, no m/r/g, no peripheral edema, pulses intact, no cyanosis or clubbing.  GI:   Soft & nt; nml bowel sounds; no organomegaly or masses detected.   Musco: Warm bil, no deformities or joint swelling noted.   Neuro: alert, no focal deficits noted.    Skin: Warm, no lesions or rashes    Lab Results:   BNP No results found for: BNP  ProBNP No results found for: PROBNP  Imaging: No results found.   Assessment & Plan:   Asthmatic bronchitis with exacerbation Exacerbation with bronchitis and sinusitis  xopenex neb x 1  Unable to do FENO   Plan  Patient Instructions  Chest xray  Today  Augmentin 875mg  Twice daily  For 10 days . Take with food.  Prednisone taper over next week  Begin Symbicort 80/4.45mcg 2 puffs Twice daily  . Rinse after use.  May use Zyrtec 10mg  At bedtime  . As needed  Drainage.  Saline nasal spray Twice daily  .  Saline nasal gel At bedtime  .  Flonase 2 puffs daily  Use Mucinex Twice daily  As needed  Cough/congestion  Delsym 2 tsp Twice daily  As needed  Cough .  Follow up with Dr. Elsworth Soho  In 4 weeks and As needed   Please contact office for sooner follow up if symptoms do not improve or worsen or seek emergency care      Allergic rhinitis Use flonase , saline and zyrtec       Rexene Edison, NP 09/30/2017

## 2017-10-01 ENCOUNTER — Telehealth: Payer: Self-pay | Admitting: Adult Health

## 2017-10-01 ENCOUNTER — Ambulatory Visit (INDEPENDENT_AMBULATORY_CARE_PROVIDER_SITE_OTHER)
Admission: RE | Admit: 2017-10-01 | Discharge: 2017-10-01 | Disposition: A | Discharge status: Home / Self Care | Payer: BLUE CROSS/BLUE SHIELD | Source: Ambulatory Visit | Attending: Adult Health | Admitting: Adult Health

## 2017-10-01 DIAGNOSIS — J40 Bronchitis, not specified as acute or chronic: Secondary | ICD-10-CM | POA: Diagnosis not present

## 2017-10-01 DIAGNOSIS — J4531 Mild persistent asthma with (acute) exacerbation: Secondary | ICD-10-CM

## 2017-10-01 IMAGING — DX DG CHEST 2V
2 series · 2 of 2 positions shown · non-contrast
Comparison: Prior chest x-ray [DATE]

CLINICAL DATA: 61-year-old female with bronchitis and sinusitis

EXAM:
CHEST  2 VIEW

[chest pa]
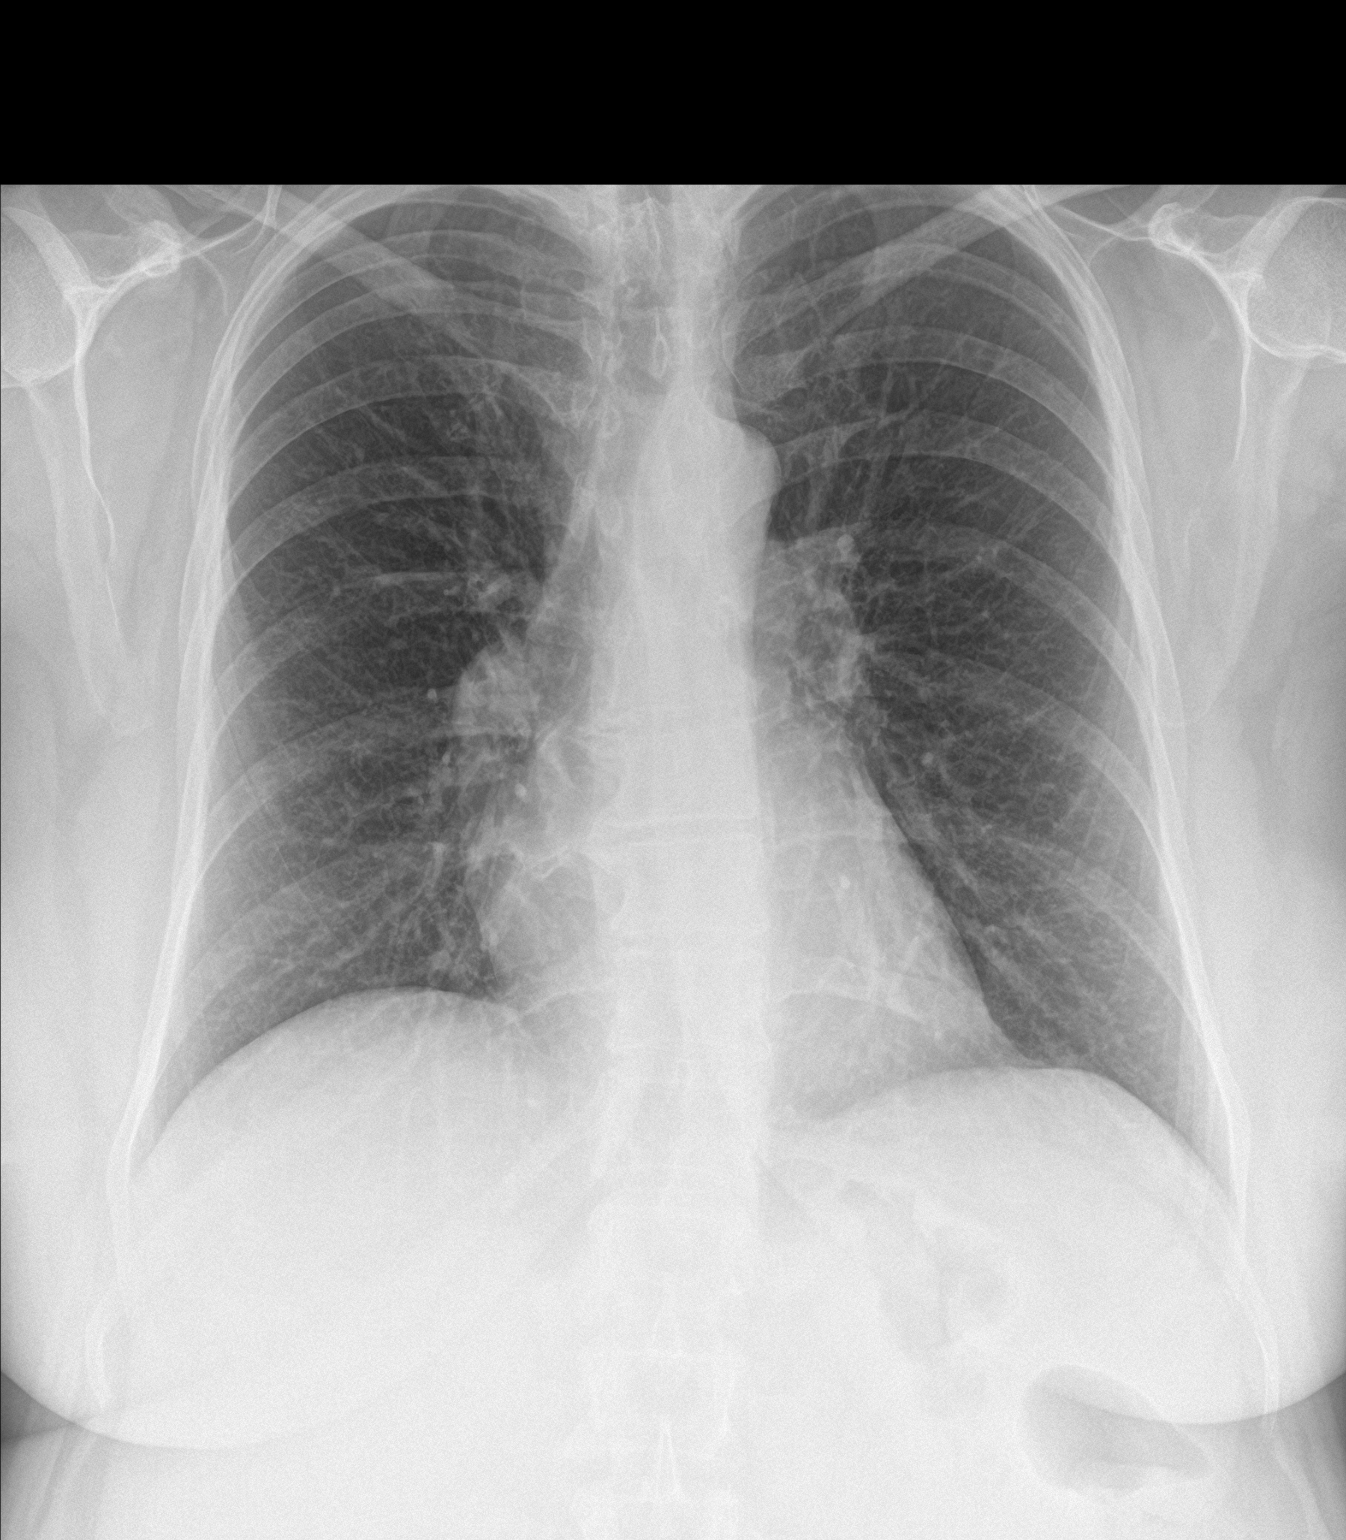

[chest lat]
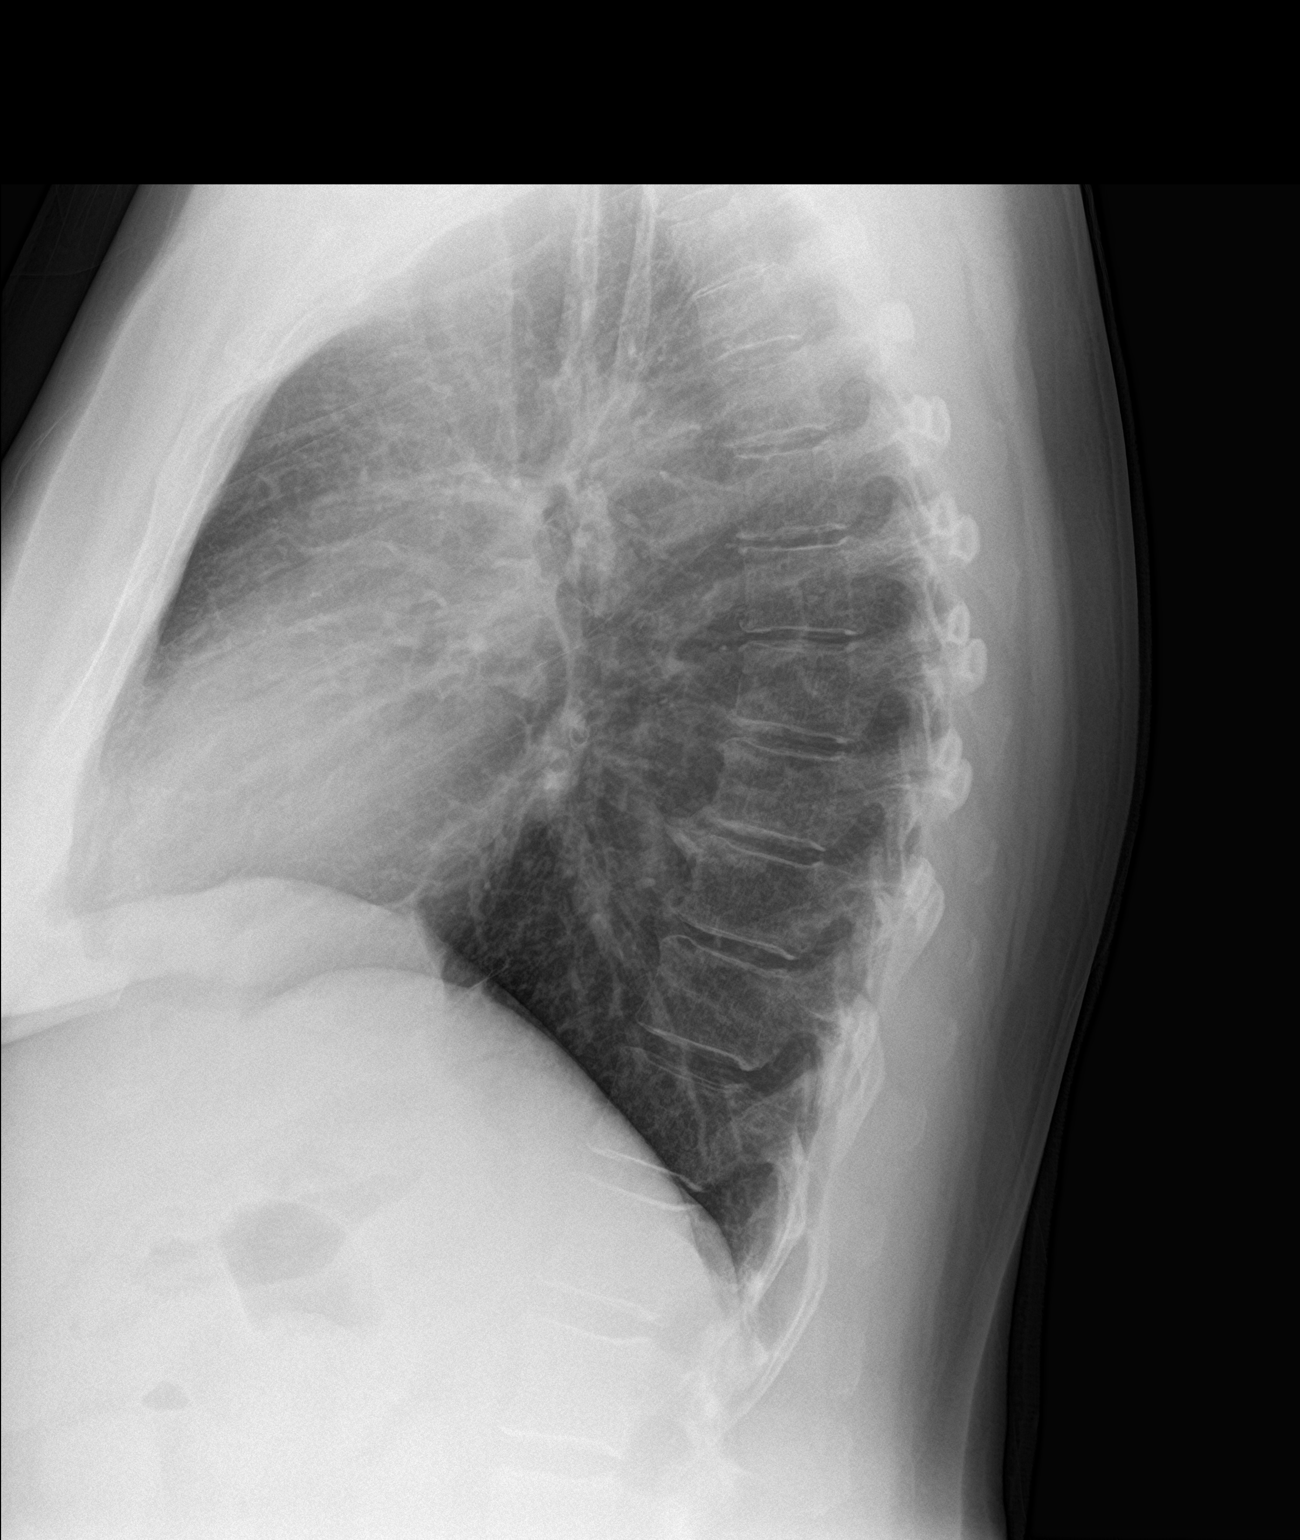

[2 of 2 positions shown; findings below may reference images not displayed]

FINDINGS: Stable cardiac and mediastinal contours. No focal airspace
consolidation to suggest pneumonia. Mild bronchitic changes are
stable compared to prior. No pleural effusion, pulmonary edema or
pneumothorax. Osseous structures are intact and unremarkable.
IMPRESSION: No active cardiopulmonary disease.

## 2017-10-01 MED ORDER — AMOXICILLIN-POT CLAVULANATE 875-125 MG PO TABS: 1.0000 | ORAL_TABLET | Freq: Two times a day (BID) | ORAL | 0 refills | Status: DC

## 2017-10-01 MED ORDER — PREDNISONE 10 MG PO TABS: ORAL_TABLET | ORAL | 0 refills | Status: DC

## 2017-10-01 NOTE — Addendum Note (Signed)
Addended by: Parke Poisson E on: 10/01/2017 02:03 PM   Modules accepted: Orders

## 2017-10-01 NOTE — Telephone Encounter (Signed)
Patient waiting in lobby about medications being filled in sent to [facility] at [facility]Marland Kitchen

## 2017-10-01 NOTE — Telephone Encounter (Signed)
Spoke with pt. Aware that Rx is being sent to pharmacy per AVS instructions.  Patient Instructions  Chest xray  Today  Augmentin 875mg  Twice daily  For 10 days . Take with food.  Prednisone taper over next week  Begin Symbicort 80/4.46mcg 2 puffs Twice daily  . Rinse after use.  May use Zyrtec 10mg  At bedtime  . As needed  Drainage.  Saline nasal spray Twice daily  .  Saline nasal gel At bedtime  .  Flonase 2 puffs daily  Use Mucinex Twice daily  As needed  Cough/congestion  Delsym 2 tsp Twice daily  As needed  Cough .  Follow up with Dr. Elsworth Soho  In 4 weeks and As needed   Please contact office for sooner follow up if symptoms do not improve or worsen or seek emergency care    Confirmed patient's pharmacy of [facility] at [facility].

## 2017-10-03 ENCOUNTER — Telehealth: Payer: Self-pay | Admitting: Pulmonary Disease

## 2017-10-03 MED ORDER — BENZONATATE 200 MG PO CAPS: 200.0000 mg | ORAL_CAPSULE | Freq: Two times a day (BID) | ORAL | 99 refills | Status: DC | PRN

## 2017-10-03 NOTE — Telephone Encounter (Signed)
Spoke with pt about me sending in an Rx  Of tessalon to her pharmacy. Pt expressed understanding. Nothing further needed.

## 2017-10-03 NOTE — Telephone Encounter (Signed)
Per TP:  Ok to send in Lerna 200mg , 1 PO BID prn #45.   ----  Pt's CXR results were already resulted by TP earlier today and per pt's chart the pt was already relayed these results.

## 2017-10-03 NOTE — Telephone Encounter (Signed)
May use Tesslon Three times a day  #45 As needed  Cough  If not better with need ov with Dr. Elsworth Soho   Please contact office for sooner follow up if symptoms do not improve or worsen or seek emergency care

## 2017-10-03 NOTE — Telephone Encounter (Signed)
Spoke with pt who states that her cough is no better.  Pt states that she is currently taking all her inhalers, Robitussin cough syrup, Mucinex,  Zyrtec, prednisone, and augmentin. Pt did not start prednisone and augmentin until 10/01/17 due to meds not being sent in until that day.  Pt is wanting to know if there is a stronger cough med she can take. Pt is unable to sleep due to her cough being bad.  Also, pt is wanting to know the results of her cxr that she had done.  Please advise. Thanks!

## 2017-10-04 NOTE — Progress Notes (Signed)
Reviewed & agree with plan  

## 2017-10-18 DIAGNOSIS — J410 Simple chronic bronchitis: Secondary | ICD-10-CM | POA: Diagnosis not present

## 2017-10-22 ENCOUNTER — Telehealth: Payer: Self-pay | Admitting: Pulmonary Disease

## 2017-10-22 ENCOUNTER — Ambulatory Visit: Payer: BLUE CROSS/BLUE SHIELD | Admitting: Pulmonary Disease

## 2017-10-22 NOTE — Telephone Encounter (Signed)
Spoke with pt. She wanted her CXR results from 10/01/17. Pt states that she does not remember anyone calling her. Her results were given to her again. Pt was made aware of her upcoming appointment with RA in February. Nothing further was needed.

## 2017-10-22 NOTE — Telephone Encounter (Signed)
ATC pt, no answer. Left message for pt to call back.  

## 2017-10-22 NOTE — Telephone Encounter (Signed)
Pt is calling back [phone]

## 2017-10-22 NOTE — Telephone Encounter (Signed)
Duplicate message. 

## 2017-11-11 DIAGNOSIS — J3081 Allergic rhinitis due to animal (cat) (dog) hair and dander: Secondary | ICD-10-CM | POA: Diagnosis not present

## 2017-11-11 DIAGNOSIS — J4530 Mild persistent asthma, uncomplicated: Secondary | ICD-10-CM | POA: Diagnosis not present

## 2017-11-11 DIAGNOSIS — J3089 Other allergic rhinitis: Secondary | ICD-10-CM | POA: Diagnosis not present

## 2017-11-11 DIAGNOSIS — J301 Allergic rhinitis due to pollen: Secondary | ICD-10-CM | POA: Diagnosis not present

## 2017-12-06 ENCOUNTER — Encounter: Payer: Self-pay | Admitting: Pulmonary Disease

## 2017-12-06 ENCOUNTER — Ambulatory Visit: Payer: BLUE CROSS/BLUE SHIELD | Admitting: Pulmonary Disease

## 2017-12-06 DIAGNOSIS — G4733 Obstructive sleep apnea (adult) (pediatric): Secondary | ICD-10-CM

## 2017-12-06 DIAGNOSIS — J4531 Mild persistent asthma with (acute) exacerbation: Secondary | ICD-10-CM | POA: Diagnosis not present

## 2017-12-06 DIAGNOSIS — J301 Allergic rhinitis due to pollen: Secondary | ICD-10-CM

## 2017-12-06 MED ORDER — BUDESONIDE-FORMOTEROL FUMARATE 80-4.5 MCG/ACT IN AERO: INHALATION_SPRAY | RESPIRATORY_TRACT | 4 refills | Status: DC

## 2017-12-06 NOTE — Patient Instructions (Addendum)
CT scan of your sinuses. Refills on Symbicort 2 puffs at bedtime-rinse mouth after use Stay on Xyzal and Singulair for now. We will consider stepdown in 6 months if you are feeling better

## 2017-12-06 NOTE — Assessment & Plan Note (Signed)
We will treat as asthma-her triggers probably include sinuses and postnasal drip and perhaps an allergic diathesis.    CT scan of your sinuses. Refills on Symbicort 2 puffs at bedtime-rinse mouth after use Stay on Xyzal and Singulair for now. We will consider stepdown in 6 months if you are feeling better

## 2017-12-06 NOTE — Assessment & Plan Note (Signed)
She is starting on allergy shots Stay on Singulair and Xyzal

## 2017-12-06 NOTE — Progress Notes (Signed)
   Subjective:    Patient ID: Joanne Sanchez, female    DOB: 1955/11/19, 62 y.o.   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  She had an acute visit 09/2017 and was given Augmentin and prednisone but felt that she did not improved.  He saw her PCP again in 11/2017 and was given another antibiotic. She has seen the allergist and allergy test was positive for trees, grasses, cats and dogs, dust and pollen.  She has 2 cats and a dog.  Allergy shots are being considered.  She was started on a regimen of Xyzal, Singulair and Symbicort at bedtime with Flonase and she feels somewhat improved. She is also on a PPI and denies heartburn  Significant tests/ events reviewed  11/2016 Spirometry >showed ratio of 84, FEV1 of 82% and FVC of 75% without any evidence of airway obstruction.  ACE inhibitor stopped 12/2016  EGD6/2017 which showed gastric polyps and a mucosal nodule in the esophagus.  08/2016 >>HST [name], dentist)AHI of 23/hour  Review of Systems neg for any significant sore throat, dysphagia, itching, sneezing, nasal congestion or excess/ purulent secretions, fever, chills, sweats, unintended wt loss, pleuritic or exertional cp, hempoptysis, orthopnea pnd or change in chronic leg swelling. Also denies presyncope, palpitations, heartburn, abdominal pain, nausea, vomiting, diarrhea or change in bowel or urinary habits, dysuria,hematuria, rash, arthralgias, visual complaints, headache, numbness weakness or ataxia.     Objective:   Physical Exam   Gen. Pleasant, well-nourished, in no distress ENT - no thrush, no post nasal drip Neck: No JVD, no thyromegaly, no carotid bruits Lungs: no use of accessory muscles, no dullness to percussion, clear without rales or rhonchi  Cardiovascular: Rhythm regular, heart sounds  normal, no murmurs or gallops, no  peripheral edema Musculoskeletal: No deformities, no cyanosis or clubbing         Assessment & Plan:

## 2017-12-06 NOTE — Assessment & Plan Note (Signed)
Not able to use CPAP consistently. We will consider repeating home sleep study in the future and oral appliance

## 2017-12-10 ENCOUNTER — Inpatient Hospital Stay: Admission: RE | Admit: 2017-12-10 | Payer: BLUE CROSS/BLUE SHIELD | Source: Ambulatory Visit

## 2017-12-17 ENCOUNTER — Inpatient Hospital Stay: Admission: RE | Admit: 2017-12-17 | Payer: BLUE CROSS/BLUE SHIELD | Source: Ambulatory Visit

## 2017-12-24 ENCOUNTER — Ambulatory Visit (INDEPENDENT_AMBULATORY_CARE_PROVIDER_SITE_OTHER)
Admission: RE | Admit: 2017-12-24 | Discharge: 2017-12-24 | Disposition: A | Discharge status: Home / Self Care | Payer: BLUE CROSS/BLUE SHIELD | Source: Ambulatory Visit | Attending: Pulmonary Disease | Admitting: Pulmonary Disease

## 2017-12-24 DIAGNOSIS — J301 Allergic rhinitis due to pollen: Secondary | ICD-10-CM

## 2017-12-24 DIAGNOSIS — J329 Chronic sinusitis, unspecified: Secondary | ICD-10-CM | POA: Diagnosis not present

## 2017-12-24 IMAGING — CT CT PARANASAL SINUSES LIMITED
1 of 2 series · 9 of 12 positions shown, 12 images · non-contrast
Comparison: None.

CLINICAL DATA: Chronic sinusitis, bronchitis and pneumonia over the
last year. Sinus pressure and drainage.

EXAM:
CT PARANASAL SINUS LIMITED WITHOUT CONTRAST
TECHNIQUE: Non-contiguous multidetector CT images of the paranasal sinuses were
obtained in a single plane without contrast.

[Series 4: limited sinus st · axial · 0.25mm/px · z∈[+141,+221]mm · 9 of 11 slices shown, 12 images]
[im 2/11  brain]
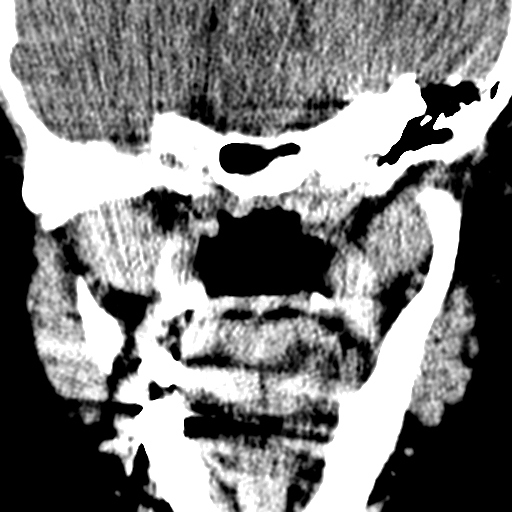
[im 2/11  bone]
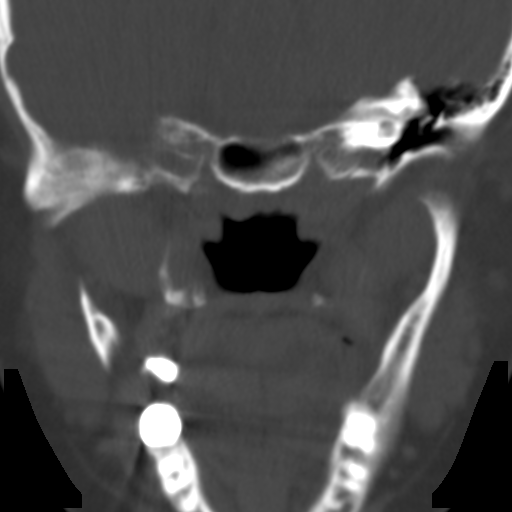
[im 3/11  bone]
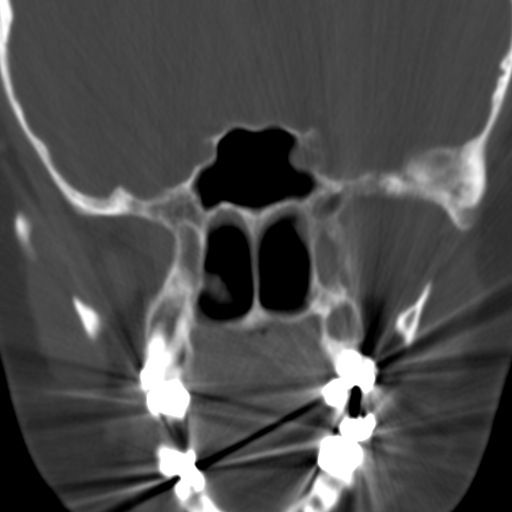
[im 4/11  bone]
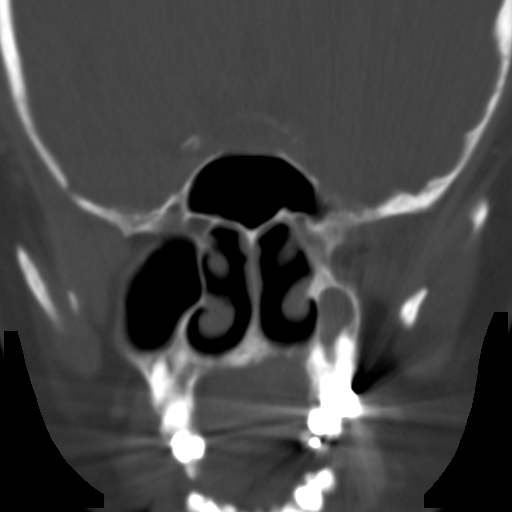
[im 5/11  bone]
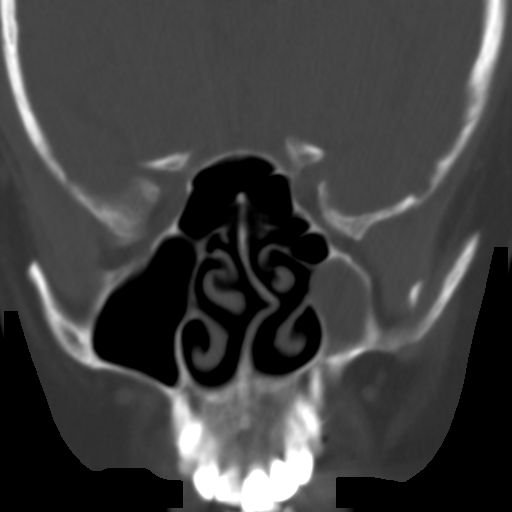
[im 6/11  brain]
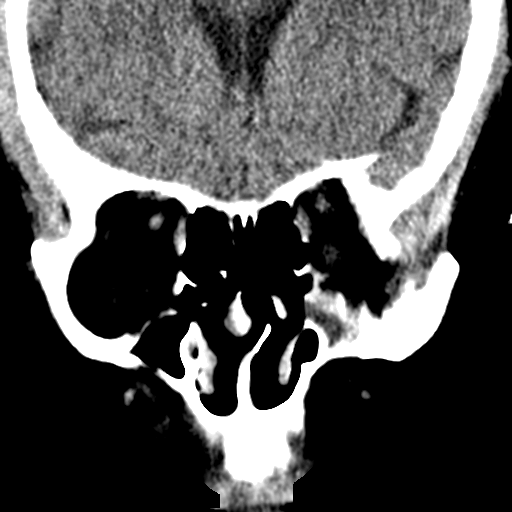
[im 6/11  bone]
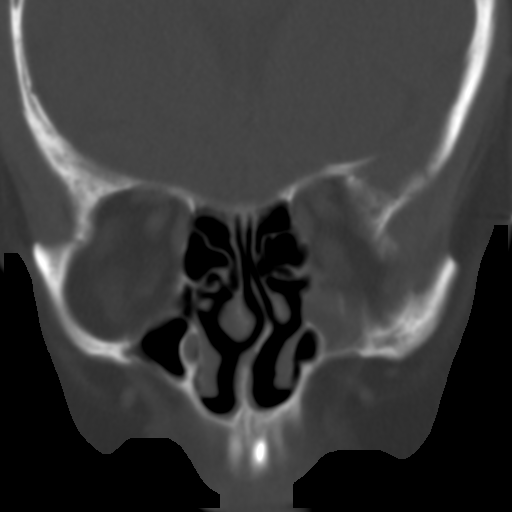
[im 7/11  bone]
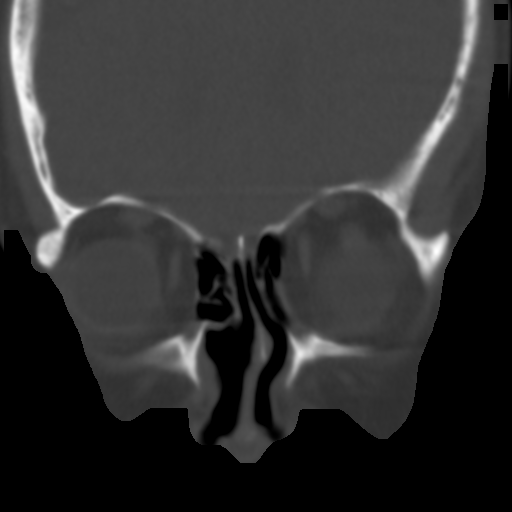
[im 8/11  bone]
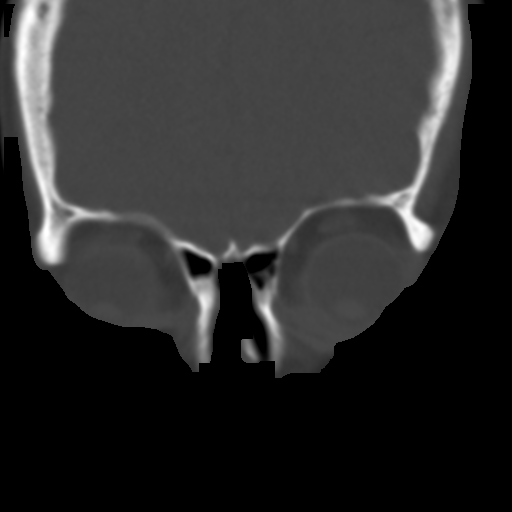
[im 9/11  bone]
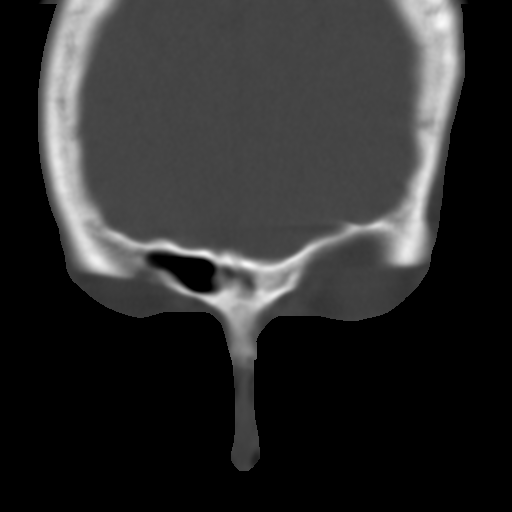
[im 10/11  brain]
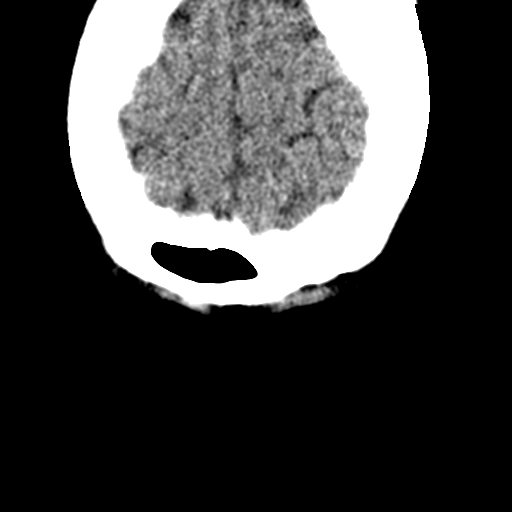
[im 10/11  bone]
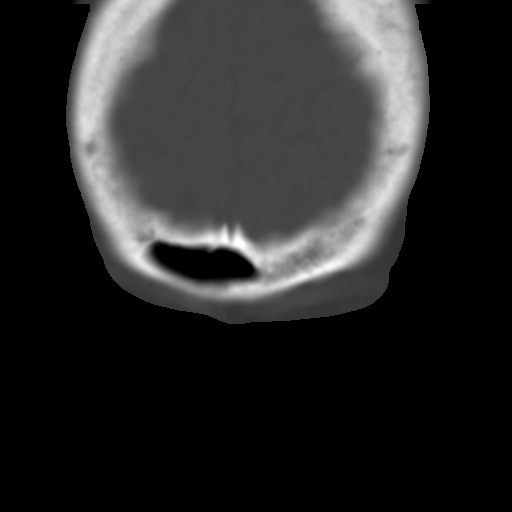

[9 of 12 positions shown; findings below may reference images not displayed]

FINDINGS: Right maxillary sinus is opacified and probably small, suggesting
chronic inflammation and possible silent sinus syndrome. Left
maxillary sinus, frontal sinuses, ethmoid sinuses and sphenoid sinus
are clear.
IMPRESSION: Opacified and small right maxillary sinus consistent with chronic
sinusitis. Possible silent sinus syndrome on the basis of the small
size.

## 2017-12-25 ENCOUNTER — Other Ambulatory Visit: Payer: Self-pay | Admitting: *Deleted

## 2017-12-25 MED ORDER — DOXYCYCLINE HYCLATE 100 MG PO TABS: 100.0000 mg | ORAL_TABLET | Freq: Every day | ORAL | 0 refills | Status: DC

## 2018-01-10 DIAGNOSIS — J0101 Acute recurrent maxillary sinusitis: Secondary | ICD-10-CM | POA: Diagnosis not present

## 2018-01-21 ENCOUNTER — Other Ambulatory Visit: Payer: Self-pay

## 2018-01-21 ENCOUNTER — Encounter (HOSPITAL_BASED_OUTPATIENT_CLINIC_OR_DEPARTMENT_OTHER): Payer: Self-pay

## 2018-01-21 ENCOUNTER — Emergency Department (HOSPITAL_BASED_OUTPATIENT_CLINIC_OR_DEPARTMENT_OTHER)
Admission: EM | Admit: 2018-01-21 | Discharge: 2018-01-21 | Disposition: A | Discharge status: Home / Self Care | Payer: BLUE CROSS/BLUE SHIELD | Attending: Emergency Medicine | Admitting: Emergency Medicine

## 2018-01-21 DIAGNOSIS — R300 Dysuria: Secondary | ICD-10-CM | POA: Diagnosis not present

## 2018-01-21 DIAGNOSIS — Z5321 Procedure and treatment not carried out due to patient leaving prior to being seen by health care provider: Secondary | ICD-10-CM | POA: Insufficient documentation

## 2018-01-21 DIAGNOSIS — M545 Low back pain: Secondary | ICD-10-CM | POA: Diagnosis not present

## 2018-01-21 HISTORY — DX: Other allergy status, other than to drugs and biological substances: Z91.09

## 2018-01-21 HISTORY — DX: Unspecified asthma, uncomplicated: J45.909

## 2018-01-21 NOTE — ED Triage Notes (Signed)
Pt states she thinks she has a UTI-c/o dysuria, lower abd and lower back pain-sx started 1+ week ago-NAD-steady gait

## 2018-01-21 NOTE — ED Notes (Signed)
Notified by registration that the patient has decided to leave.

## 2018-02-05 ENCOUNTER — Encounter: Payer: Self-pay | Admitting: Allergy and Immunology

## 2018-02-05 ENCOUNTER — Ambulatory Visit: Payer: BLUE CROSS/BLUE SHIELD | Admitting: Allergy and Immunology

## 2018-02-05 VITALS — BP 138/84 | HR 72 | Temp 97.8°F | Resp 16 | Ht 65.35 in | Wt 174.6 lb

## 2018-02-05 DIAGNOSIS — J4540 Moderate persistent asthma, uncomplicated: Secondary | ICD-10-CM

## 2018-02-05 DIAGNOSIS — H1010 Acute atopic conjunctivitis, unspecified eye: Secondary | ICD-10-CM | POA: Diagnosis not present

## 2018-02-05 DIAGNOSIS — J3089 Other allergic rhinitis: Secondary | ICD-10-CM

## 2018-02-05 MED ORDER — EPINEPHRINE 0.3 MG/0.3ML IJ SOAJ: 0.3000 mg | Freq: Once | INTRAMUSCULAR | 3 refills | Status: AC

## 2018-02-05 NOTE — Patient Instructions (Addendum)
  1.  Perform allergen avoidance measures as best as possible.  Sports glasses  2.  Treat and prevent inflammation:   A.  Flonase 1-2 sprays each nostril 1 time per day  B.  Symbicort 80 -2 inhalations 1-2 times per day   3. If needed:   A. Cetirizine 10mg  one tablet one time per day  B. Proair HFA or similar 2 inhalations evry 4-6 hours if needed  C. Eye drops  4. Start immunotherapy  5. Return to clinic in 12 weeks or earlier if problem

## 2018-02-05 NOTE — Progress Notes (Signed)
Dear Dr. Justin Mend,  Thank you for referring Joanne Sanchez to the [facility] on 02/05/2018.   Below is a summation of this patient's evaluation and recommendations.  Thank you for your referral. I will keep you informed about this patient's response to treatment.   If you have any questions please do not hesitate to contact me.   Sincerely,  Jiles Prows, MD Allergy / Immunology [place]   ______________________________________________________________________    NEW PATIENT NOTE  Referring Provider: Maurice Small, MD Primary Provider: Maurice Small, MD Date of office visit: 02/05/2018    Subjective:   Chief Complaint:  Joanne Sanchez (DOB: 06-May-1956) is a 62 y.o. female who presents to the clinic on 02/05/2018 with a chief complaint of Asthma (cough/ wheezing); Frequent Infections; Sinus Problem (runny, stuffy, sneezing, congestion, infections); Sleep Apnea (on C-PAP); throat problem; and Allergies .     HPI: Joanne Sanchez presents to this clinic in evaluation of multiorgan atopic disease including allergic rhinitis and asthma.  She has a long history of atopic disease affecting her respiratory tract and her eyes and had a particularly bad year in 2018 and a particularly bad winter 2018 requiring lots of medical intervention and visits with various physicians including a local allergist and local pulmonologist.  She had a diagnosis of chronic sinusitis confirmed by a sinus CT scan and was treated with prolonged antibiotics.  She states that she has had 3 antibiotics and 3 systemic steroids since October 2018.  Most of the symptoms associated with her sinus issue have resolved.  However, she still has sneezing and nasal congestion and itchy watery eyes and intermittent wheezing and coughing although all the symptoms are better on her current plan of nasal steroid and combination inhaler.  In fact,  she rarely uses a short acting bronchodilator at this point in time while using Symbicort only 1 time per day.  She only uses Symbicort 1 time per day because it gives rise to insomnia if she uses it in the evening.  As well, a lot of her nasal congestion has responded to the nasal steroid and she can now use her CPAP machine without much difficulty.  Her long history of atopic respiratory disease involving both her upper and lower airway and her eyes appears to be triggered off by cats and outdoor exposure and pollen exposure and dust.  She does have cats located at home but she keeps them out of the bedroom.  She does not appear to have an issue with exercise-induced asthma at this point as she can walk without any difficulty at all.  She does not have any associated anosmia or ugly nasal discharge at this point as well.  She does not have a significant amount of throat issues at this point as well.  There appeared to be some less than optimal interaction with her local allergist and she presents today in further evaluation of her atopic disease and is very interested in starting a course of immunotherapy.  With her evaluation in January 2019 she did have skin testing which identified hypersensitivity to pollens and dust mite and cat and dog and to some degree mold.  Past Medical History:  Diagnosis Date  . Allergy   . Anemia   . Asthma   . Environmental allergies   . GERD (gastroesophageal reflux disease)   . Hyperlipidemia   . Hypertension   . Pre-diabetes   .  Recurrent upper respiratory infection (URI)   . Vertigo     Past Surgical History:  Procedure Laterality Date  . ABDOMINAL HYSTERECTOMY  2000   TAH/BSO  . WISDOM TOOTH EXTRACTION  1990's    Allergies as of 02/05/2018      Reactions   Iodinated Diagnostic Agents Hives, Swelling   Prior reaction to ivp contrast Patient pre-medicated w/ 13 hour prep prednisone and benadryl   Lipitor [atorvastatin] Other (See Comments)   MUSCLE  CRAMPS   Metformin And Related Diarrhea      Medication List      amLODipine 5 MG tablet Commonly known as:  NORVASC Take 5 mg by mouth daily.   aspirin 81 MG tablet Take 81 mg by mouth. Two-three times a week   budesonide-formoterol 80-4.5 MCG/ACT inhaler Commonly known as:  SYMBICORT 2 puffs at bedtime   fluticasone 50 MCG/ACT nasal spray Commonly known as:  FLONASE Place 2 sprays daily into both nostrils.   levocetirizine 5 MG tablet Commonly known as:  XYZAL Take 5 mg by mouth every evening.   LUMIFY 0.025 % Soln Generic drug:  Brimonidine Tartrate Apply 1 drop to eye 2 (two) times daily.   MAGNESIUM CHLORIDE-CALCIUM PO Take by mouth daily. Reported on 05/04/2016   Omega-3 1000 MG Caps Take by mouth.   omeprazole 40 MG capsule Commonly known as:  PRILOSEC Take 40 mg by mouth daily.   pravastatin 10 MG tablet Commonly known as:  PRAVACHOL Take 10 mg by mouth daily.   PROAIR HFA 108 (90 Base) MCG/ACT inhaler Generic drug:  albuterol INHALE 2 PUFFS BY MOUTH EVERY 4 HOURS IF NEEDED FOR COUGH   Vitamin D-3 5000 units Tabs Take 5,000 Units by mouth daily.       Review of systems negative except as noted in HPI / PMHx or noted below:  Review of Systems  Constitutional: Negative.   HENT: Negative.   Eyes: Negative.   Respiratory: Negative.   Cardiovascular: Negative.   Gastrointestinal: Negative.   Genitourinary: Negative.   Musculoskeletal: Negative.   Skin: Negative.   Neurological: Negative.   Endo/Heme/Allergies: Negative.   Psychiatric/Behavioral: Negative.     Family History  Problem Relation Age of Onset  . Heart disease Mother   . Hypertension Mother   . Hyperlipidemia Mother   . Heart disease Father   . Hyperlipidemia Father   . Hypertension Father   . Atrial fibrillation Father   . Hypertension Brother   . Hypertension Brother   . Breast cancer Neg Hx     Social History   Socioeconomic History  . Marital status: Married     Spouse name: Not on file  . Number of children: Not on file  . Years of education: Not on file  . Highest education level: Not on file  Occupational History  . Occupation: unemployed  Social Needs  . Financial resource strain: Not on file  . Food insecurity:    Worry: Not on file    Inability: Not on file  . Transportation needs:    Medical: Not on file    Non-medical: Not on file  Tobacco Use  . Smoking status: Never Smoker  . Smokeless tobacco: Never Used  Substance and Sexual Activity  . Alcohol use: Yes    Alcohol/week: 0.0 oz    Comment: occasionally  . Drug use: No  . Sexual activity: Not on file  Lifestyle  . Physical activity:    Days per week: Not on file  Minutes per session: Not on file  . Stress: Not on file  Relationships  . Social connections:    Talks on phone: Not on file    Gets together: Not on file    Attends religious service: Not on file    Active member of club or organization: Not on file    Attends meetings of clubs or organizations: Not on file    Relationship status: Not on file  . Intimate partner violence:    Fear of current or ex partner: Not on file    Emotionally abused: Not on file    Physically abused: Not on file    Forced sexual activity: Not on file  Other Topics Concern  . Not on file  Social History Narrative  . Not on file    Environmental and Social history  Joanne Sanchez lives in a house with a dry environment, cats and dogs located inside the household, no carpet in the bedroom, no plastic on the bed, no plastic on the pillow, and no smoking ongoing with inside the household.  Objective:   Vitals:   02/05/18 0941  BP: 138/84  Pulse: 72  Resp: 16  Temp: 97.8 F (36.6 C)   Height: 5' 5.35" (166 cm) Weight: 174 lb 9.6 oz (79.2 kg)  Physical Exam  Constitutional: She is well-developed, well-nourished, and in no distress.  HENT:  Head: Normocephalic.  Right Ear: Tympanic membrane, external ear and ear canal normal.    Left Ear: Tympanic membrane, external ear and ear canal normal.  Nose: Nose normal. No mucosal edema or rhinorrhea.  Mouth/Throat: Uvula is midline, oropharynx is clear and moist and mucous membranes are normal. No oropharyngeal exudate.  Eyes: Conjunctivae are normal.  Neck: Trachea normal. No tracheal tenderness present. No tracheal deviation present. No thyromegaly present.  Cardiovascular: Normal rate, regular rhythm, S1 normal, S2 normal and normal heart sounds.  No murmur heard. Pulmonary/Chest: Breath sounds normal. No stridor. No respiratory distress. She has no wheezes. She has no rales.  Musculoskeletal: She exhibits no edema.  Lymphadenopathy:       Head (right side): No tonsillar adenopathy present.       Head (left side): No tonsillar adenopathy present.    She has no cervical adenopathy.  Neurological: She is alert. Gait normal.  Skin: No rash noted. She is not diaphoretic. No erythema. Nails show no clubbing.  Psychiatric: Mood and affect normal.    Diagnostics: Allergy skin tests were not performed.  Review of skin test performed by local allergist dated 11 November 2017 identifies significant hypersensitivity directed against trees (red cedar, Elder, Oak), grasses (Southern grass, [place], [place]),, weeds (ragweed), dust mite, cat, dog, and also sensitivity against cockroach and various molds.  Spirometry was performed and demonstrated an FEV1 of 2.07 @ 77 % of predicted. FEV1/FVC = 0.75  Results of a sinus CT scan obtained 24 December 2017 identified the following:  Right maxillary sinus is opacified and probably small, suggesting chronic inflammation and possible silent sinus syndrome. Left maxillary sinus, frontal sinuses, ethmoid sinuses and sphenoid sinus are clear...  I reviewed the images and her right maxillary sinus is at least 50% less volume than her left maxillary sinus and does appear to be completely opacified.  There is a suggestion of convexity at the outlet  suggesting that there may be a large maxillary polyp filling up this area.  Results of the chest x-ray obtained 01 October 2017 identified the following:  Stable cardiac and mediastinal contours. No  focal airspace consolidation to suggest pneumonia. Mild bronchitic changes are stable compared to prior. No pleural effusion, pulmonary edema or pneumothorax. Osseous structures are intact and unremarkable.  Assessment and Plan:    1. Asthma, moderate persistent, well-controlled   2. Other allergic rhinitis   3. Seasonal allergic conjunctivitis     1.  Perform allergen avoidance measures as best as possible.  Sports glasses  2.  Treat and prevent inflammation:   A.  Flonase 1-2 sprays each nostril 1 time per day  B.  Symbicort 80 -2 inhalations 1-2 times per day   3. If needed:   A. Cetirizine 10mg  one tablet one time per day  B. Proair HFA or similar 2 inhalations evry 4-6 hours if needed  C. Eye drops  4. Start immunotherapy  5. Return to clinic in 12 weeks or earlier if problem   Cerita has multiorgan atopic disease and I have given her some suggestions about allergen avoidance measures especially if she goes through this upcoming spring and we will keep her on anti-inflammatory medications for both her upper and lower airway as noted above.  She is definitely a candidate for immunotherapy and will be getting that form of therapy started sometime in the next few weeks.  I will see her back in this clinic in the summer 2019 or earlier if there is a problem.  Jiles Prows, MD Allergy / Immunology [facility]

## 2018-02-06 ENCOUNTER — Encounter: Payer: Self-pay | Admitting: Allergy and Immunology

## 2018-02-10 DIAGNOSIS — H2511 Age-related nuclear cataract, right eye: Secondary | ICD-10-CM | POA: Diagnosis not present

## 2018-02-10 DIAGNOSIS — H2512 Age-related nuclear cataract, left eye: Secondary | ICD-10-CM | POA: Diagnosis not present

## 2018-02-10 DIAGNOSIS — H1045 Other chronic allergic conjunctivitis: Secondary | ICD-10-CM | POA: Diagnosis not present

## 2018-02-10 DIAGNOSIS — H5213 Myopia, bilateral: Secondary | ICD-10-CM | POA: Diagnosis not present

## 2018-02-13 ENCOUNTER — Other Ambulatory Visit: Payer: Self-pay | Admitting: Allergy and Immunology

## 2018-02-13 DIAGNOSIS — J3089 Other allergic rhinitis: Secondary | ICD-10-CM

## 2018-02-13 NOTE — Progress Notes (Signed)
VIALS EXP 02-14-19

## 2018-02-14 DIAGNOSIS — J301 Allergic rhinitis due to pollen: Secondary | ICD-10-CM | POA: Diagnosis not present

## 2018-02-19 ENCOUNTER — Ambulatory Visit (INDEPENDENT_AMBULATORY_CARE_PROVIDER_SITE_OTHER): Payer: BLUE CROSS/BLUE SHIELD | Admitting: *Deleted

## 2018-02-19 DIAGNOSIS — J309 Allergic rhinitis, unspecified: Secondary | ICD-10-CM | POA: Diagnosis not present

## 2018-02-19 DIAGNOSIS — H6983 Other specified disorders of Eustachian tube, bilateral: Secondary | ICD-10-CM | POA: Diagnosis not present

## 2018-02-19 DIAGNOSIS — J342 Deviated nasal septum: Secondary | ICD-10-CM | POA: Diagnosis not present

## 2018-02-19 DIAGNOSIS — J343 Hypertrophy of nasal turbinates: Secondary | ICD-10-CM | POA: Diagnosis not present

## 2018-02-19 DIAGNOSIS — J320 Chronic maxillary sinusitis: Secondary | ICD-10-CM | POA: Diagnosis not present

## 2018-02-19 DIAGNOSIS — H6123 Impacted cerumen, bilateral: Secondary | ICD-10-CM | POA: Diagnosis not present

## 2018-02-19 DIAGNOSIS — H903 Sensorineural hearing loss, bilateral: Secondary | ICD-10-CM | POA: Diagnosis not present

## 2018-02-19 NOTE — Progress Notes (Signed)
Immunotherapy   Patient Details  Name: Joanne Sanchez MRN: [id number] Date of Birth: Sep 26, 1956  02/19/2018  Aurelio Brash started injections for  weeds-dmite/cat-dog-tree-grass Following schedule: B  Frequency:2 times per week Epi-Pen:Epi-Pen Available  Consent signed and patient instructions given.   [name] 02/19/2018, 4:34 PM

## 2018-02-25 ENCOUNTER — Ambulatory Visit (INDEPENDENT_AMBULATORY_CARE_PROVIDER_SITE_OTHER): Payer: BLUE CROSS/BLUE SHIELD | Admitting: *Deleted

## 2018-02-25 ENCOUNTER — Telehealth: Payer: Self-pay | Admitting: Allergy and Immunology

## 2018-02-25 DIAGNOSIS — J309 Allergic rhinitis, unspecified: Secondary | ICD-10-CM | POA: Diagnosis not present

## 2018-02-25 NOTE — Telephone Encounter (Signed)
Patsy?

## 2018-02-25 NOTE — Telephone Encounter (Signed)
Pt came up to window and wanted to let you know she is having sinus surgery on may 25 and wanted to know if you need to she her before she has it .

## 2018-02-25 NOTE — Telephone Encounter (Signed)
Yes please have patient see me a week before her surgery  May 25

## 2018-02-28 ENCOUNTER — Telehealth: Payer: Self-pay | Admitting: Allergy and Immunology

## 2018-02-28 MED ORDER — BUDESONIDE-FORMOTEROL FUMARATE 80-4.5 MCG/ACT IN AERO: 2.0000 | INHALATION_SPRAY | Freq: Two times a day (BID) | RESPIRATORY_TRACT | 2 refills | Status: DC

## 2018-02-28 NOTE — Telephone Encounter (Signed)
Called and spoke with patient and informed her that Rx was sent in 12/2017 with 4 refills. Patient said she called pharmacy and they are saying that she has no refills. Called pharmacy and the switch from [facility] to [facility] caused some patient's medications and refills to not transfer. Rx has been sent in and patient is aware.

## 2018-02-28 NOTE — Telephone Encounter (Signed)
Pt called and said that she was on Symbicort 80 before starting her but dr Neldon Mc wanted her to stay on it. So she needs to have rx called into [facility]. [phone].

## 2018-03-01 DIAGNOSIS — J45909 Unspecified asthma, uncomplicated: Secondary | ICD-10-CM | POA: Diagnosis not present

## 2018-03-01 DIAGNOSIS — J309 Allergic rhinitis, unspecified: Secondary | ICD-10-CM | POA: Diagnosis not present

## 2018-03-05 ENCOUNTER — Ambulatory Visit (INDEPENDENT_AMBULATORY_CARE_PROVIDER_SITE_OTHER): Payer: BLUE CROSS/BLUE SHIELD

## 2018-03-05 ENCOUNTER — Telehealth: Payer: Self-pay | Admitting: Pulmonary Disease

## 2018-03-05 DIAGNOSIS — J309 Allergic rhinitis, unspecified: Secondary | ICD-10-CM

## 2018-03-05 DIAGNOSIS — Z0000 Encounter for general adult medical examination without abnormal findings: Secondary | ICD-10-CM | POA: Diagnosis not present

## 2018-03-05 DIAGNOSIS — E559 Vitamin D deficiency, unspecified: Secondary | ICD-10-CM | POA: Diagnosis not present

## 2018-03-05 DIAGNOSIS — R7303 Prediabetes: Secondary | ICD-10-CM | POA: Diagnosis not present

## 2018-03-05 DIAGNOSIS — E785 Hyperlipidemia, unspecified: Secondary | ICD-10-CM | POA: Diagnosis not present

## 2018-03-05 DIAGNOSIS — I10 Essential (primary) hypertension: Secondary | ICD-10-CM | POA: Diagnosis not present

## 2018-03-05 NOTE — Telephone Encounter (Signed)
Spoke with patient. She stated that she wanted both a physical copy and a copy of her report. Advised her to call medical records for the actual CD but I would print and mail the report to her. She verbalized understanding. She verified her address. Gave her the number to medical records.   Will place her report in the mail today.   Nothing else needed at time of call.

## 2018-03-11 ENCOUNTER — Ambulatory Visit (INDEPENDENT_AMBULATORY_CARE_PROVIDER_SITE_OTHER): Payer: BLUE CROSS/BLUE SHIELD | Admitting: *Deleted

## 2018-03-11 DIAGNOSIS — J309 Allergic rhinitis, unspecified: Secondary | ICD-10-CM | POA: Diagnosis not present

## 2018-03-18 ENCOUNTER — Ambulatory Visit (INDEPENDENT_AMBULATORY_CARE_PROVIDER_SITE_OTHER): Payer: BLUE CROSS/BLUE SHIELD | Admitting: *Deleted

## 2018-03-18 ENCOUNTER — Encounter: Payer: Self-pay | Admitting: Allergy and Immunology

## 2018-03-18 ENCOUNTER — Ambulatory Visit: Payer: BLUE CROSS/BLUE SHIELD | Admitting: Allergy and Immunology

## 2018-03-18 VITALS — BP 138/70 | HR 84 | Resp 24

## 2018-03-18 DIAGNOSIS — J4540 Moderate persistent asthma, uncomplicated: Secondary | ICD-10-CM

## 2018-03-18 DIAGNOSIS — J3089 Other allergic rhinitis: Secondary | ICD-10-CM | POA: Diagnosis not present

## 2018-03-18 DIAGNOSIS — H1010 Acute atopic conjunctivitis, unspecified eye: Secondary | ICD-10-CM

## 2018-03-18 DIAGNOSIS — H6983 Other specified disorders of Eustachian tube, bilateral: Secondary | ICD-10-CM

## 2018-03-18 DIAGNOSIS — J309 Allergic rhinitis, unspecified: Secondary | ICD-10-CM | POA: Diagnosis not present

## 2018-03-18 MED ORDER — TIOTROPIUM BROMIDE MONOHYDRATE 1.25 MCG/ACT IN AERS: 2.0000 | INHALATION_SPRAY | Freq: Every day | RESPIRATORY_TRACT | 3 refills | Status: DC

## 2018-03-18 MED ORDER — BUDESONIDE-FORMOTEROL FUMARATE 160-4.5 MCG/ACT IN AERO: 2.0000 | INHALATION_SPRAY | Freq: Two times a day (BID) | RESPIRATORY_TRACT | 3 refills | Status: DC

## 2018-03-18 NOTE — Progress Notes (Signed)
Follow-up Note  Referring Provider: Maurice Small, MD Primary Provider: Maurice Small, MD Date of Office Visit: 03/18/2018  Subjective:   Joanne Sanchez (DOB: 1956-10-30) is a 62 y.o. female who returns to the [facility] on 03/18/2018 in re-evaluation of the following:  HPI: Aprill presents to this clinic in reevaluation of her multiorgan atopic disease addressed during her initial evaluation of 05 February 2018.  She has had a difficult time the spring.  She still continues to have issues with wheezing and coughing and using her bronchodilator just about every day.  As well, she has had issues with nasal congestion and sneezing and she has had ear fullness and ear popping.  She went to the urgent care center about 3 weeks ago and apparently was treated with nasal ipratropium for rhinorrhea.  She has had some low-grade headache as well.  She has not had any ugly nasal discharge or anosmia.  She is scheduled for repair of deviated septum and sinus surgery on 28 Mar 2018 by Dr. Benjamine Mola.  She has started a course of immunotherapy.  Allergies as of 03/18/2018      Reactions   Iodinated Diagnostic Agents Hives, Swelling   Prior reaction to ivp contrast Patient pre-medicated w/ 13 hour prep prednisone and benadryl   Lipitor [atorvastatin] Other (See Comments)   MUSCLE CRAMPS   Metformin And Related Diarrhea      Medication List      amLODipine 5 MG tablet Commonly known as:  NORVASC Take 5 mg by mouth daily.   benzonatate 200 MG capsule Commonly known as:  TESSALON TK 1 C PO BID PRF COUGH   budesonide-formoterol 80-4.5 MCG/ACT inhaler Commonly known as:  SYMBICORT Inhale 2 puffs into the lungs 2 (two) times daily.   fluticasone 50 MCG/ACT nasal spray Commonly known as:  FLONASE Place 2 sprays daily into both nostrils.   ipratropium 0.06 % nasal spray Commonly known as:  ATROVENT INSTILL 2 SPRAYS INTO EACH NOSTRIL 3 TIMES DAILY   levocetirizine 5 MG  tablet Commonly known as:  XYZAL Take 5 mg by mouth every evening.   MAGNESIUM CHLORIDE-CALCIUM PO Take by mouth daily. Reported on 05/04/2016   Omega-3 1000 MG Caps Take by mouth.   omeprazole 40 MG capsule Commonly known as:  PRILOSEC Take 40 mg by mouth daily.   pravastatin 10 MG tablet Commonly known as:  PRAVACHOL Take 10 mg by mouth daily.   PROAIR HFA 108 (90 Base) MCG/ACT inhaler Generic drug:  albuterol INHALE 2 PUFFS BY MOUTH EVERY 4 HOURS IF NEEDED FOR COUGH   Vitamin D-3 5000 units Tabs Take 5,000 Units by mouth daily.       Past Medical History:  Diagnosis Date  . Allergy   . Anemia   . Asthma   . Environmental allergies   . GERD (gastroesophageal reflux disease)   . Hyperlipidemia   . Hypertension   . Pre-diabetes   . Recurrent upper respiratory infection (URI)   . Vertigo     Past Surgical History:  Procedure Laterality Date  . ABDOMINAL HYSTERECTOMY  2000   TAH/BSO  . WISDOM TOOTH EXTRACTION  1990's    Review of systems negative except as noted in HPI / PMHx or noted below:  Review of Systems  Constitutional: Negative.   HENT: Negative.   Eyes: Negative.   Respiratory: Negative.   Cardiovascular: Negative.   Gastrointestinal: Negative.   Genitourinary: Negative.   Musculoskeletal: Negative.   Skin:  Negative.   Neurological: Negative.   Endo/Heme/Allergies: Negative.   Psychiatric/Behavioral: Negative.      Objective:   Vitals:   03/18/18 1814  BP: 138/70  Pulse: 84  Resp: (!) 24          Physical Exam  HENT:  Head: Normocephalic.  Right Ear: External ear and ear canal normal. Tympanic membrane is retracted (Cleared with pneumatic movement).  Left Ear: External ear and ear canal normal. Tympanic membrane is retracted.  Nose: Mucosal edema present. No rhinorrhea.  Mouth/Throat: Uvula is midline, oropharynx is clear and moist and mucous membranes are normal. No oropharyngeal exudate.  Eyes: Conjunctivae are normal.   Neck: Trachea normal. No tracheal tenderness present. No tracheal deviation present. No thyromegaly present.  Cardiovascular: Normal rate, regular rhythm, S1 normal, S2 normal and normal heart sounds.  No murmur heard. Pulmonary/Chest: Breath sounds normal. No stridor. No respiratory distress. She has no wheezes. She has no rales.  Musculoskeletal: She exhibits no edema.  Lymphadenopathy:       Head (right side): No tonsillar adenopathy present.       Head (left side): No tonsillar adenopathy present.    She has no cervical adenopathy.  Neurological: She is alert.  Skin: No rash noted. She is not diaphoretic. No erythema. Nails show no clubbing.    Diagnostics:    Spirometry was performed and demonstrated an FEV1 of 2.08 at 125 % of predicted.  The patient had an Asthma Control Test with the following results: ACT Total Score: 15.    Assessment and Plan:   1. Not well controlled moderate persistent asthma   2. Other allergic rhinitis   3. Seasonal allergic conjunctivitis   4. ETD (Eustachian tube dysfunction), bilateral     1.  Perform allergen avoidance measures as best as possible.     2.  Continue to Treat and prevent inflammation:   A.  Flonase 1 sprays each nostril 2 time per day  B.  Azelastine 1 spray each nostril 2 times per day  C.  Symbicort 160 -2 inhalations 2 times per day  D.  Spiriva 1.25 Respimat - 2 inhalations 1 time per day   3. If needed:   A. Cetirizine 10mg  one tablet one time per day  B. Proair HFA or similar 2 inhalations every 4-6 hours if needed  C. Eye drops  D. Nasal saline  4. Continue immunotherapy  5. Follow through with sinus surgery 28 Mar 2018   6. Return to clinic in 4 weeks or earlier if problem   I have given Zamarah a large collection of medical therapy directed against her upper and lower airway inflammatory disease as noted above.  Hopefully we can get her through the spring time season on this plan.  She will be having sinus  surgery in 10 days which should help with a significant amount of her upper airway issue.  I will see her back in this clinic in 4 weeks and there may be an opportunity to consolidate her treatment at that point.  Allena Katz, MD Allergy / Immunology [place]

## 2018-03-18 NOTE — Patient Instructions (Addendum)
  1.  Perform allergen avoidance measures as best as possible.  Sports glasses  2.  Continue to Treat and prevent inflammation:   A.  Flonase 1 sprays each nostril 2 time per day  B.  Azelastine 1 spray each nostril 2 times per day  C.  Symbicort 160 -2 inhalations 2 times per day  D.  Spiriva 1.25 Respimat - 2 inhalations 1 time per day   3. If needed:   A. Cetirizine 10mg  one tablet one time per day  B. Proair HFA or similar 2 inhalations every 4-6 hours if needed  C. Eye drops  D. Nasal saline  4. Continue immunotherapy  5. Follow through with sinus surgery 28 Mar 2018   6. Return to clinic in 4 weeks or earlier if problem

## 2018-03-19 ENCOUNTER — Encounter: Payer: Self-pay | Admitting: Allergy and Immunology

## 2018-03-27 ENCOUNTER — Ambulatory Visit (INDEPENDENT_AMBULATORY_CARE_PROVIDER_SITE_OTHER): Payer: BLUE CROSS/BLUE SHIELD

## 2018-03-27 DIAGNOSIS — J309 Allergic rhinitis, unspecified: Secondary | ICD-10-CM

## 2018-03-28 ENCOUNTER — Other Ambulatory Visit (INDEPENDENT_AMBULATORY_CARE_PROVIDER_SITE_OTHER): Payer: Self-pay | Admitting: Otolaryngology

## 2018-03-28 DIAGNOSIS — J342 Deviated nasal septum: Secondary | ICD-10-CM | POA: Diagnosis not present

## 2018-03-28 DIAGNOSIS — J339 Nasal polyp, unspecified: Secondary | ICD-10-CM | POA: Diagnosis not present

## 2018-03-28 DIAGNOSIS — J3489 Other specified disorders of nose and nasal sinuses: Secondary | ICD-10-CM | POA: Diagnosis not present

## 2018-03-28 DIAGNOSIS — J338 Other polyp of sinus: Secondary | ICD-10-CM | POA: Diagnosis not present

## 2018-03-28 DIAGNOSIS — J320 Chronic maxillary sinusitis: Secondary | ICD-10-CM | POA: Diagnosis not present

## 2018-03-28 DIAGNOSIS — J343 Hypertrophy of nasal turbinates: Secondary | ICD-10-CM | POA: Diagnosis not present

## 2018-04-01 DIAGNOSIS — J320 Chronic maxillary sinusitis: Secondary | ICD-10-CM | POA: Diagnosis not present

## 2018-04-09 ENCOUNTER — Ambulatory Visit (INDEPENDENT_AMBULATORY_CARE_PROVIDER_SITE_OTHER): Payer: BLUE CROSS/BLUE SHIELD

## 2018-04-09 DIAGNOSIS — J309 Allergic rhinitis, unspecified: Secondary | ICD-10-CM

## 2018-04-15 ENCOUNTER — Encounter: Payer: Self-pay | Admitting: Allergy and Immunology

## 2018-04-15 ENCOUNTER — Ambulatory Visit: Payer: BLUE CROSS/BLUE SHIELD | Admitting: Allergy and Immunology

## 2018-04-15 VITALS — BP 132/78 | HR 72 | Resp 18

## 2018-04-15 DIAGNOSIS — H1010 Acute atopic conjunctivitis, unspecified eye: Secondary | ICD-10-CM | POA: Diagnosis not present

## 2018-04-15 DIAGNOSIS — J3089 Other allergic rhinitis: Secondary | ICD-10-CM

## 2018-04-15 DIAGNOSIS — J4540 Moderate persistent asthma, uncomplicated: Secondary | ICD-10-CM | POA: Diagnosis not present

## 2018-04-15 DIAGNOSIS — H6983 Other specified disorders of Eustachian tube, bilateral: Secondary | ICD-10-CM

## 2018-04-15 DIAGNOSIS — J301 Allergic rhinitis due to pollen: Secondary | ICD-10-CM | POA: Diagnosis not present

## 2018-04-15 DIAGNOSIS — J320 Chronic maxillary sinusitis: Secondary | ICD-10-CM | POA: Diagnosis not present

## 2018-04-15 NOTE — Progress Notes (Signed)
Follow-up Note  Referring Provider: Maurice Small, MD Primary Provider: Maurice Small, MD Date of Office Visit: 04/15/2018  Subjective:   Joanne Sanchez (DOB: Jun 15, 1956) is a 62 y.o. female who returns to the [facility] on 04/15/2018 in re-evaluation of the following:  HPI: Joanne Sanchez returns to this clinic in reevaluation of her allergic disease.  Her last visit to this clinic was 18 Mar 2018.  During the interval she is undergone repair of a deviated septum and sinus surgery by Dr. Benjamine Mola.  She is 3 weeks out from that surgery and is doing very well.  She feels as though her airway is open.  Unfortunately, she still has right ear fullness and ear popping and there has not really been a plan to address this issue at this point.  Her lungs are doing great.  She does not have any wheezing and coughing and does not need to use any short acting bronchodilator.  She continues on immunotherapy without any adverse effects.  Allergies as of 04/15/2018      Reactions   Iodinated Diagnostic Agents Hives, Swelling   Prior reaction to ivp contrast Patient pre-medicated w/ 13 hour prep prednisone and benadryl   Lipitor [atorvastatin] Other (See Comments)   MUSCLE CRAMPS   Metformin And Related Diarrhea      Medication List      amLODipine 5 MG tablet Commonly known as:  NORVASC Take 5 mg by mouth daily.   benzonatate 200 MG capsule Commonly known as:  TESSALON TK 1 C PO BID PRF COUGH   budesonide-formoterol 80-4.5 MCG/ACT inhaler Commonly known as:  SYMBICORT Inhale 2 puffs into the lungs 2 (two) times daily.   budesonide-formoterol 160-4.5 MCG/ACT inhaler Commonly known as:  SYMBICORT Inhale 2 puffs into the lungs 2 (two) times daily.   fluticasone 50 MCG/ACT nasal spray Commonly known as:  FLONASE Place 2 sprays daily into both nostrils.   ipratropium 0.06 % nasal spray Commonly known as:  ATROVENT INSTILL 2 SPRAYS INTO EACH NOSTRIL 3 TIMES DAILY     levocetirizine 5 MG tablet Commonly known as:  XYZAL Take 5 mg by mouth every evening.   MAGNESIUM CHLORIDE-CALCIUM PO Take by mouth daily. Reported on 05/04/2016   Omega-3 1000 MG Caps Take by mouth.   omeprazole 40 MG capsule Commonly known as:  PRILOSEC Take 40 mg by mouth daily.   pravastatin 10 MG tablet Commonly known as:  PRAVACHOL Take 10 mg by mouth daily.   PROAIR HFA 108 (90 Base) MCG/ACT inhaler Generic drug:  albuterol INHALE 2 PUFFS BY MOUTH EVERY 4 HOURS IF NEEDED FOR COUGH   Tiotropium Bromide Monohydrate 1.25 MCG/ACT Aers Commonly known as:  SPIRIVA RESPIMAT Inhale 2 puffs into the lungs daily.   Vitamin D-3 5000 units Tabs Take 5,000 Units by mouth daily.       Past Medical History:  Diagnosis Date  . Allergy   . Anemia   . Asthma   . Environmental allergies   . GERD (gastroesophageal reflux disease)   . Hyperlipidemia   . Hypertension   . Pre-diabetes   . Recurrent upper respiratory infection (URI)   . Vertigo     Past Surgical History:  Procedure Laterality Date  . ABDOMINAL HYSTERECTOMY  2000   TAH/BSO  . WISDOM TOOTH EXTRACTION  1990's    Review of systems negative except as noted in HPI / PMHx or noted below:  Review of Systems  Constitutional: Negative.   HENT:  Negative.   Eyes: Negative.   Respiratory: Negative.   Cardiovascular: Negative.   Gastrointestinal: Negative.   Genitourinary: Negative.   Musculoskeletal: Negative.   Skin: Negative.   Neurological: Negative.   Endo/Heme/Allergies: Negative.   Psychiatric/Behavioral: Negative.      Objective:   Vitals:   04/15/18 1722  BP: 132/78  Pulse: 72  Resp: 18  SpO2: 97%          Physical Exam  HENT:  Head: Normocephalic.  Right Ear: Tympanic membrane, external ear and ear canal normal.  Left Ear: Tympanic membrane, external ear and ear canal normal.  Nose: Nose normal. No mucosal edema or rhinorrhea.  Mouth/Throat: Uvula is midline, oropharynx is clear  and moist and mucous membranes are normal. No oropharyngeal exudate.  Eyes: Conjunctivae are normal.  Neck: Trachea normal. No tracheal tenderness present. No tracheal deviation present. No thyromegaly present.  Cardiovascular: Normal rate, regular rhythm, S1 normal, S2 normal and normal heart sounds.  No murmur heard. Pulmonary/Chest: Breath sounds normal. No stridor. No respiratory distress. She has no wheezes. She has no rales.  Musculoskeletal: She exhibits no edema.  Lymphadenopathy:       Head (right side): No tonsillar adenopathy present.       Head (left side): No tonsillar adenopathy present.    She has no cervical adenopathy.  Neurological: She is alert.  Skin: No rash noted. She is not diaphoretic. No erythema. Nails show no clubbing.    Diagnostics:    Spirometry was not performed secondary to her recent surgery    Assessment and Plan:   1. Asthma, moderate persistent, well-controlled   2. Other allergic rhinitis   3. Seasonal allergic conjunctivitis   4. ETD (Eustachian tube dysfunction), bilateral     1.  Perform allergen avoidance measures as best as possible.  Sports glasses  2.  Continue to Treat and prevent inflammation:   A.  Flonase 1 sprays each nostril 2 time per day  B.  Azelastine 1 spray each nostril 2 times per day  C.  DECREASE Symbicort 160 -2 inhalations 1 time per day  D.  Spiriva 1.25 Respimat - 2 inhalations 1 time per day   3. If needed:   A. Cetirizine 10mg  one tablet one time per day  B. Proair HFA or similar 2 inhalations every 4-6 hours if needed  C. Eye drops  D. Nasal saline  4. Continue immunotherapy  5. Contact Dr. Benjamine Mola about hearing issue  6. Return to clinic in 12 weeks or earlier if problem   We will continue to have Marisah utilize a combination of anti-inflammatory medications for her respiratory tract and immunotherapy.  I will make an attempt to decrease her inhaled steroid dose as noted above.  I would like for her to  revisit with Dr. Benjamine Mola about her hearing issues and she will make that appointment sometime in the next several weeks.  As well, she has a history of sleep apnea treated with CPAP and she has been off her CPAP machine for 3 weeks and feels fine.  She does not have any issues with waking up at nighttime and she feels refreshed upon awakening in the morning.  She wonders if she needs a CPAP machine at this point.  I have asked her to discuss this issue with her primary care doctor to have a repeat sleep study performed compared to her study completed 2 years ago to see if she still requires CPAP.  Allena Katz, MD Allergy /  Immunology [facility]

## 2018-04-15 NOTE — Patient Instructions (Signed)
  1.  Perform allergen avoidance measures as best as possible.  Sports glasses  2.  Continue to Treat and prevent inflammation:   A.  Flonase 1 sprays each nostril 2 time per day  B.  Azelastine 1 spray each nostril 2 times per day  C.  DECREASE Symbicort 160 -2 inhalations 1 time per day  D.  Spiriva 1.25 Respimat - 2 inhalations 1 time per day   3. If needed:   A. Cetirizine 10mg  one tablet one time per day  B. Proair HFA or similar 2 inhalations every 4-6 hours if needed  C. Eye drops  D. Nasal saline  4. Continue immunotherapy  5. Contact Dr. Benjamine Mola about hearing issue  6. Return to clinic in 12 weeks or earlier if problem

## 2018-04-16 ENCOUNTER — Encounter: Payer: Self-pay | Admitting: Allergy and Immunology

## 2018-04-30 ENCOUNTER — Ambulatory Visit (INDEPENDENT_AMBULATORY_CARE_PROVIDER_SITE_OTHER): Payer: BLUE CROSS/BLUE SHIELD

## 2018-04-30 ENCOUNTER — Ambulatory Visit: Payer: BLUE CROSS/BLUE SHIELD | Admitting: Allergy and Immunology

## 2018-04-30 DIAGNOSIS — J309 Allergic rhinitis, unspecified: Secondary | ICD-10-CM

## 2018-05-07 ENCOUNTER — Ambulatory Visit (INDEPENDENT_AMBULATORY_CARE_PROVIDER_SITE_OTHER): Payer: BLUE CROSS/BLUE SHIELD

## 2018-05-07 DIAGNOSIS — J309 Allergic rhinitis, unspecified: Secondary | ICD-10-CM | POA: Diagnosis not present

## 2018-05-21 ENCOUNTER — Ambulatory Visit (INDEPENDENT_AMBULATORY_CARE_PROVIDER_SITE_OTHER): Payer: BLUE CROSS/BLUE SHIELD

## 2018-05-21 DIAGNOSIS — J309 Allergic rhinitis, unspecified: Secondary | ICD-10-CM | POA: Diagnosis not present

## 2018-05-21 MED ORDER — AZELASTINE HCL 0.1 % NA SOLN: 1.0000 | Freq: Two times a day (BID) | NASAL | 5 refills | Status: DC

## 2018-05-21 NOTE — Progress Notes (Signed)
Patient came in to get injection and stated when she came for an OV on 04/15/2018 with Dr. Neldon Mc her nasal spray didn't get sent to pharmacy. Astelin has been sent in.

## 2018-05-30 ENCOUNTER — Ambulatory Visit (INDEPENDENT_AMBULATORY_CARE_PROVIDER_SITE_OTHER): Payer: BLUE CROSS/BLUE SHIELD

## 2018-05-30 DIAGNOSIS — J309 Allergic rhinitis, unspecified: Secondary | ICD-10-CM

## 2018-06-05 ENCOUNTER — Telehealth: Payer: Self-pay | Admitting: Allergy and Immunology

## 2018-06-05 MED ORDER — LEVOCETIRIZINE DIHYDROCHLORIDE 5 MG PO TABS: 5.0000 mg | ORAL_TABLET | Freq: Every evening | ORAL | 3 refills | Status: DC

## 2018-06-05 MED ORDER — FLUTICASONE PROPIONATE 50 MCG/ACT NA SUSP: 1.0000 | Freq: Two times a day (BID) | NASAL | 3 refills | Status: DC

## 2018-06-05 NOTE — Telephone Encounter (Signed)
Called patient to clarify refills needed stated she needed flonase, xyzal and azelastine. Writer sent in flonase and xyzal she still has azelastine refills patient verbalized understanding

## 2018-06-05 NOTE — Telephone Encounter (Signed)
Pt called and needs to have Flovase, xyzal, [email or web address]. [phone].

## 2018-06-06 ENCOUNTER — Ambulatory Visit (INDEPENDENT_AMBULATORY_CARE_PROVIDER_SITE_OTHER): Payer: BLUE CROSS/BLUE SHIELD

## 2018-06-06 DIAGNOSIS — J309 Allergic rhinitis, unspecified: Secondary | ICD-10-CM | POA: Diagnosis not present

## 2018-06-11 ENCOUNTER — Ambulatory Visit (INDEPENDENT_AMBULATORY_CARE_PROVIDER_SITE_OTHER): Payer: BLUE CROSS/BLUE SHIELD | Admitting: *Deleted

## 2018-06-11 DIAGNOSIS — J309 Allergic rhinitis, unspecified: Secondary | ICD-10-CM | POA: Diagnosis not present

## 2018-06-11 DIAGNOSIS — H903 Sensorineural hearing loss, bilateral: Secondary | ICD-10-CM | POA: Diagnosis not present

## 2018-06-16 ENCOUNTER — Ambulatory Visit (INDEPENDENT_AMBULATORY_CARE_PROVIDER_SITE_OTHER): Payer: BLUE CROSS/BLUE SHIELD

## 2018-06-16 ENCOUNTER — Ambulatory Visit: Payer: Self-pay

## 2018-06-16 DIAGNOSIS — J309 Allergic rhinitis, unspecified: Secondary | ICD-10-CM

## 2018-06-27 DIAGNOSIS — J069 Acute upper respiratory infection, unspecified: Secondary | ICD-10-CM | POA: Diagnosis not present

## 2018-07-02 ENCOUNTER — Ambulatory Visit (INDEPENDENT_AMBULATORY_CARE_PROVIDER_SITE_OTHER): Payer: BLUE CROSS/BLUE SHIELD

## 2018-07-02 DIAGNOSIS — J309 Allergic rhinitis, unspecified: Secondary | ICD-10-CM

## 2018-07-09 ENCOUNTER — Ambulatory Visit (INDEPENDENT_AMBULATORY_CARE_PROVIDER_SITE_OTHER): Payer: BLUE CROSS/BLUE SHIELD

## 2018-07-09 DIAGNOSIS — J309 Allergic rhinitis, unspecified: Secondary | ICD-10-CM

## 2018-07-15 ENCOUNTER — Encounter: Payer: Self-pay | Admitting: Allergy and Immunology

## 2018-07-15 ENCOUNTER — Ambulatory Visit: Payer: BLUE CROSS/BLUE SHIELD | Admitting: Allergy and Immunology

## 2018-07-15 VITALS — BP 118/58 | HR 62 | Resp 20

## 2018-07-15 DIAGNOSIS — J3089 Other allergic rhinitis: Secondary | ICD-10-CM | POA: Diagnosis not present

## 2018-07-15 DIAGNOSIS — J4540 Moderate persistent asthma, uncomplicated: Secondary | ICD-10-CM | POA: Diagnosis not present

## 2018-07-15 DIAGNOSIS — H1010 Acute atopic conjunctivitis, unspecified eye: Secondary | ICD-10-CM | POA: Diagnosis not present

## 2018-07-15 DIAGNOSIS — J309 Allergic rhinitis, unspecified: Secondary | ICD-10-CM

## 2018-07-15 NOTE — Patient Instructions (Addendum)
  1.  Continue to Perform allergen avoidance measures     2.  Continue to Treat and prevent inflammation:   A.  Flonase 1 sprays each nostril 1-2 times per day  B.  Azelastine 1 spray each nostril 1-2 times per day  C.  Symbicort 80 -2 inhalations 1-2 times per day  D.  Spiriva 1.25 Respimat - 2 inhalations 1 time per day   3. If needed:   A. Cetirizine 10mg  one tablet one time per day  B. Proair HFA or similar 2 inhalations every 4-6 hours if needed  C. Eye drops  D. Nasal saline  4. Continue immunotherapy (Auvi-Q)  5. Return to clinic in 6 months or earlier if problem  6. Obtain fall flu vaccine

## 2018-07-15 NOTE — Progress Notes (Signed)
Follow-up Note  Referring Provider: Maurice Small, MD Primary Provider: Maurice Small, MD Date of Office Visit: 07/15/2018  Subjective:   Joanne Sanchez (DOB: [birth year]) is a 62 y.o. female who returns to the [facility] on 07/15/2018 in re-evaluation of the following:  HPI: Joanne Sanchez presents to this clinic in reevaluation of her allergic rhinoconjunctivitis and history of chronic sinusitis and asthma.  Her last visit to this clinic was 15 April 2018.  Presently she is really doing very well with her airway.  She has no symptoms involving her nose or chest and does not need to use any as needed medications.  She continues to use a combination of Flonase and nasal antihistamine and Symbicort and Spiriva and has had no need to use any antibiotics or systemic steroids.  This is the best that she has felt in years.  She continues to utilize immunotherapy without any adverse effects.  Allergies as of 07/15/2018      Reactions   Iodinated Diagnostic Agents Hives, Swelling   Prior reaction to ivp contrast Patient pre-medicated w/ 13 hour prep prednisone and benadryl   Lipitor [atorvastatin] Other (See Comments)   MUSCLE CRAMPS   Metformin And Related Diarrhea      Medication List      amLODipine 5 MG tablet Commonly known as:  NORVASC Take 5 mg by mouth daily.   azelastine 0.1 % nasal spray Commonly known as:  ASTELIN Place 1 spray into both nostrils 2 (two) times daily.   benzonatate 200 MG capsule Commonly known as:  TESSALON TK 1 C PO BID PRF COUGH   budesonide-formoterol 80-4.5 MCG/ACT inhaler Commonly known as:  SYMBICORT Inhale 2 puffs into the lungs 2 (two) times daily.   fluticasone 50 MCG/ACT nasal spray Commonly known as:  FLONASE Place 1 spray into both nostrils 2 (two) times daily.   ipratropium 0.06 % nasal spray Commonly known as:  ATROVENT INSTILL 2 SPRAYS INTO EACH NOSTRIL 3 TIMES DAILY   levocetirizine 5 MG tablet Commonly known as:   XYZAL Take 1 tablet (5 mg total) by mouth every evening.   MAGNESIUM CHLORIDE-CALCIUM PO Take by mouth daily. Reported on 05/04/2016   Omega-3 1000 MG Caps Take by mouth.   omeprazole 40 MG capsule Commonly known as:  PRILOSEC Take 40 mg by mouth daily.   pravastatin 10 MG tablet Commonly known as:  PRAVACHOL Take 10 mg by mouth daily.   PROAIR HFA 108 (90 Base) MCG/ACT inhaler Generic drug:  albuterol INHALE 2 PUFFS BY MOUTH EVERY 4 HOURS IF NEEDED FOR COUGH   Tiotropium Bromide Monohydrate 1.25 MCG/ACT Aers Inhale 2 puffs into the lungs daily.   Vitamin D-3 5000 units Tabs Take 5,000 Units by mouth daily.       Past Medical History:  Diagnosis Date  . Allergy   . Anemia   . Asthma   . Environmental allergies   . GERD (gastroesophageal reflux disease)   . Hyperlipidemia   . Hypertension   . Pre-diabetes   . Recurrent upper respiratory infection (URI)   . Vertigo     Past Surgical History:  Procedure Laterality Date  . ABDOMINAL HYSTERECTOMY  2000   TAH/BSO  . WISDOM TOOTH EXTRACTION  1990's    Review of systems negative except as noted in HPI / PMHx or noted below:  Review of Systems  Constitutional: Negative.   HENT: Negative.   Eyes: Negative.   Respiratory: Negative.   Cardiovascular: Negative.  Gastrointestinal: Negative.   Genitourinary: Negative.   Musculoskeletal: Negative.   Skin: Negative.   Neurological: Negative.   Endo/Heme/Allergies: Negative.   Psychiatric/Behavioral: Negative.      Objective:   Vitals:   07/15/18 1036  BP: (!) 118/58  Pulse: 62  Resp: 20  SpO2: 97%          Physical Exam  HENT:  Head: Normocephalic.  Right Ear: Tympanic membrane, external ear and ear canal normal.  Left Ear: Tympanic membrane, external ear and ear canal normal.  Nose: Nose normal. No mucosal edema or rhinorrhea.  Mouth/Throat: Uvula is midline, oropharynx is clear and moist and mucous membranes are normal. No oropharyngeal exudate.   Eyes: Conjunctivae are normal.  Neck: Trachea normal. No tracheal tenderness present. No tracheal deviation present. No thyromegaly present.  Cardiovascular: Normal rate, regular rhythm, S1 normal, S2 normal and normal heart sounds.  No murmur heard. Pulmonary/Chest: Breath sounds normal. No stridor. No respiratory distress. She has no wheezes. She has no rales.  Musculoskeletal: She exhibits no edema.  Lymphadenopathy:       Head (right side): No tonsillar adenopathy present.       Head (left side): No tonsillar adenopathy present.    She has no cervical adenopathy.  Neurological: She is alert.  Skin: No rash noted. She is not diaphoretic. No erythema. Nails show no clubbing.    Diagnostics:    Spirometry was performed and demonstrated an FEV1 of 2.02 at 80 % of predicted.  The patient had an Asthma Control Test with the following results: ACT Total Score: 20.    Assessment and Plan:   1. Asthma, moderate persistent, well-controlled   2. Perennial allergic rhinitis   3. Seasonal allergic conjunctivitis     1.  Continue to Perform allergen avoidance measures     2.  Continue to Treat and prevent inflammation:   A.  Flonase 1 sprays each nostril 1-2 times per day  B.  Azelastine 1 spray each nostril 1-2 times per day  C.  Symbicort 80 -2 inhalations 1-2 times per day  D.  Spiriva 1.25 Respimat - 2 inhalations 1 time per day   3. If needed:   A. Cetirizine 10mg  one tablet one time per day  B. Proair HFA or similar 2 inhalations every 4-6 hours if needed  C. Eye drops  D. Nasal saline  4. Continue immunotherapy (Auvi-Q)  5. Return to clinic in 6 months or earlier if problem  6. Obtain fall flu vaccine   Joanne Sanchez is really doing well on her medical therapy and for the most part she is using her anti-inflammatory agents for both her upper and lower airway only one time per day.  She will continue on immunotherapy and I will see her back in this clinic in 6 months or earlier  if there is a problem.  Joanne Katz, MD Allergy / Immunology [place]

## 2018-07-16 ENCOUNTER — Encounter: Payer: Self-pay | Admitting: Allergy and Immunology

## 2018-07-25 DIAGNOSIS — M5489 Other dorsalgia: Secondary | ICD-10-CM | POA: Diagnosis not present

## 2018-07-30 ENCOUNTER — Ambulatory Visit (INDEPENDENT_AMBULATORY_CARE_PROVIDER_SITE_OTHER): Payer: BLUE CROSS/BLUE SHIELD

## 2018-07-30 DIAGNOSIS — J309 Allergic rhinitis, unspecified: Secondary | ICD-10-CM

## 2018-08-13 ENCOUNTER — Ambulatory Visit (INDEPENDENT_AMBULATORY_CARE_PROVIDER_SITE_OTHER): Payer: BLUE CROSS/BLUE SHIELD | Admitting: *Deleted

## 2018-08-13 DIAGNOSIS — J309 Allergic rhinitis, unspecified: Secondary | ICD-10-CM

## 2018-08-20 ENCOUNTER — Ambulatory Visit (INDEPENDENT_AMBULATORY_CARE_PROVIDER_SITE_OTHER): Payer: BLUE CROSS/BLUE SHIELD | Admitting: *Deleted

## 2018-08-20 DIAGNOSIS — J309 Allergic rhinitis, unspecified: Secondary | ICD-10-CM

## 2018-08-29 ENCOUNTER — Ambulatory Visit (INDEPENDENT_AMBULATORY_CARE_PROVIDER_SITE_OTHER): Payer: BLUE CROSS/BLUE SHIELD

## 2018-08-29 DIAGNOSIS — J309 Allergic rhinitis, unspecified: Secondary | ICD-10-CM | POA: Diagnosis not present

## 2018-09-11 ENCOUNTER — Other Ambulatory Visit: Payer: Self-pay | Admitting: Allergy and Immunology

## 2018-09-12 ENCOUNTER — Ambulatory Visit (INDEPENDENT_AMBULATORY_CARE_PROVIDER_SITE_OTHER): Payer: BLUE CROSS/BLUE SHIELD | Admitting: *Deleted

## 2018-09-12 DIAGNOSIS — J309 Allergic rhinitis, unspecified: Secondary | ICD-10-CM

## 2018-09-18 ENCOUNTER — Ambulatory Visit (INDEPENDENT_AMBULATORY_CARE_PROVIDER_SITE_OTHER): Payer: BLUE CROSS/BLUE SHIELD | Admitting: *Deleted

## 2018-09-18 DIAGNOSIS — J309 Allergic rhinitis, unspecified: Secondary | ICD-10-CM | POA: Diagnosis not present

## 2018-09-30 ENCOUNTER — Ambulatory Visit (INDEPENDENT_AMBULATORY_CARE_PROVIDER_SITE_OTHER): Payer: BLUE CROSS/BLUE SHIELD | Admitting: *Deleted

## 2018-09-30 DIAGNOSIS — J309 Allergic rhinitis, unspecified: Secondary | ICD-10-CM

## 2018-10-06 ENCOUNTER — Telehealth: Payer: Self-pay

## 2018-10-06 MED ORDER — BUDESONIDE-FORMOTEROL FUMARATE 80-4.5 MCG/ACT IN AERO: 2.0000 | INHALATION_SPRAY | Freq: Two times a day (BID) | RESPIRATORY_TRACT | 3 refills | Status: DC

## 2018-10-06 NOTE — Telephone Encounter (Signed)
Refills have been sent in as requested.

## 2018-10-06 NOTE — Telephone Encounter (Signed)
Patient is calling due to she does not have any more refills on her Symbicort   Please send to [facility].

## 2018-10-17 ENCOUNTER — Ambulatory Visit (INDEPENDENT_AMBULATORY_CARE_PROVIDER_SITE_OTHER): Payer: BLUE CROSS/BLUE SHIELD | Admitting: *Deleted

## 2018-10-17 DIAGNOSIS — J309 Allergic rhinitis, unspecified: Secondary | ICD-10-CM | POA: Diagnosis not present

## 2018-11-14 ENCOUNTER — Ambulatory Visit (INDEPENDENT_AMBULATORY_CARE_PROVIDER_SITE_OTHER): Payer: BLUE CROSS/BLUE SHIELD | Admitting: *Deleted

## 2018-11-14 DIAGNOSIS — J309 Allergic rhinitis, unspecified: Secondary | ICD-10-CM

## 2018-11-15 ENCOUNTER — Other Ambulatory Visit: Payer: Self-pay | Admitting: Allergy and Immunology

## 2018-12-03 ENCOUNTER — Ambulatory Visit (INDEPENDENT_AMBULATORY_CARE_PROVIDER_SITE_OTHER): Payer: BLUE CROSS/BLUE SHIELD

## 2018-12-03 DIAGNOSIS — J309 Allergic rhinitis, unspecified: Secondary | ICD-10-CM | POA: Diagnosis not present

## 2018-12-10 ENCOUNTER — Ambulatory Visit (INDEPENDENT_AMBULATORY_CARE_PROVIDER_SITE_OTHER): Payer: BLUE CROSS/BLUE SHIELD

## 2018-12-10 DIAGNOSIS — J309 Allergic rhinitis, unspecified: Secondary | ICD-10-CM

## 2018-12-15 ENCOUNTER — Other Ambulatory Visit: Payer: Self-pay | Admitting: Allergy and Immunology

## 2018-12-24 ENCOUNTER — Ambulatory Visit: Payer: BLUE CROSS/BLUE SHIELD | Admitting: Allergy

## 2018-12-24 ENCOUNTER — Encounter: Payer: Self-pay | Admitting: Allergy

## 2018-12-24 VITALS — BP 120/70 | HR 78 | Temp 97.8°F | Resp 20

## 2018-12-24 DIAGNOSIS — J0100 Acute maxillary sinusitis, unspecified: Secondary | ICD-10-CM | POA: Insufficient documentation

## 2018-12-24 DIAGNOSIS — J4540 Moderate persistent asthma, uncomplicated: Secondary | ICD-10-CM | POA: Diagnosis not present

## 2018-12-24 DIAGNOSIS — J3089 Other allergic rhinitis: Secondary | ICD-10-CM

## 2018-12-24 DIAGNOSIS — H1010 Acute atopic conjunctivitis, unspecified eye: Secondary | ICD-10-CM

## 2018-12-24 DIAGNOSIS — J302 Other seasonal allergic rhinitis: Secondary | ICD-10-CM | POA: Diagnosis not present

## 2018-12-24 MED ORDER — AMOXICILLIN-POT CLAVULANATE 875-125 MG PO TABS: 1.0000 | ORAL_TABLET | Freq: Two times a day (BID) | ORAL | 0 refills | Status: AC

## 2018-12-24 MED ORDER — FLUTICASONE PROPIONATE 93 MCG/ACT NA EXHU: 2.0000 | INHALANT_SUSPENSION | Freq: Two times a day (BID) | NASAL | 5 refills | Status: DC

## 2018-12-24 NOTE — Assessment & Plan Note (Signed)
Increased coughing with current URI.  Today's spirometry showed: No overt abnormalities noted given today's efforts but patient was coughing and there was also no improvement in FEV1 post bronchodilator treatment but clinically felt better.  Increase Symbicort to 80 2 puffs twice a day for the next 2 weeks. . Daily controller medication(s): Symbicort 80 2 puffs daily with spacer and rinse mouth afterwards. . Prior to physical activity: May use albuterol rescue inhaler 2 puffs 5 to 15 minutes prior to strenuous physical activities. Marland Kitchen Rescue medications: May use albuterol rescue inhaler 2 puffs or nebulizer every 4 to 6 hours as needed for shortness of breath, chest tightness, coughing, and wheezing. Monitor frequency of use.  . During upper respiratory infections: Start Symbicort 80 2 puffs twice a day with spacer and rinse mouth afterwards for 1-2 weeks.

## 2018-12-24 NOTE — Assessment & Plan Note (Signed)
3 week history of URI symptoms and now most likely has sinus infection.  Start Augmentin 875mg  twice a day for 10 days. Take with food if it causes upset stomach.  Start prednisone taper.  Start Xhance 2 sprays twice a day. This replaces Flonase. Demonstrated proper use and sample given.  Continue Azelastine 1-2 sprays twice a day as needed for drainage.  Nasal saline spray (i.e., Simply Saline) or nasal saline lavage (i.e., NeilMed) is recommended as needed and prior to medicated nasal sprays.

## 2018-12-24 NOTE — Assessment & Plan Note (Signed)
   Resume allergy injections next week when feeling better.  May use over the counter antihistamines such as Zyrtec (cetirizine), Claritin (loratadine), Allegra (fexofenadine), or Xyzal (levocetirizine) daily as needed.  Continue environmental control measures.  Use nasal sprays as recommended above.

## 2018-12-24 NOTE — Patient Instructions (Addendum)
Sinus infection  Start augmentin 875mg  twice a day for 10 days.   Take with food if it causes upset stomach.  Start prednisone taper.  Start Xhance 2 sprays twice a day. This replaces Flonase.   Continue Azelastine 1-2 sprays twice a day as needed for drainage.  Nasal saline spray (i.e., Simply Saline) or nasal saline lavage (i.e., NeilMed) is recommended as needed and prior to medicated nasal sprays.  Allergies:  Resume allergy injections next week when feeling better.  May use over the counter antihistamines such as Zyrtec (cetirizine), Claritin (loratadine), Allegra (fexofenadine), or Xyzal (levocetirizine) daily as needed.  Continue environmental control measures.  Asthma:   Increase Symbicort to 80 2 puffs twice a day for the next 2 weeks. . Daily controller medication(s): Symbicort 80 2 puffs daily with spacer and rinse mouth afterwards. . Prior to physical activity: May use albuterol rescue inhaler 2 puffs 5 to 15 minutes prior to strenuous physical activities. Marland Kitchen Rescue medications: May use albuterol rescue inhaler 2 puffs or nebulizer every 4 to 6 hours as needed for shortness of breath, chest tightness, coughing, and wheezing. Monitor frequency of use.  . During upper respiratory infections: Start Symbicort 80 2 puffs twice a day with spacer and rinse mouth afterwards for 1-2 weeks. . Asthma control goals:  o Full participation in all desired activities (may need albuterol before activity) o Albuterol use two times or less a week on average (not counting use with activity) o Cough interfering with sleep two times or less a month o Oral steroids no more than once a year o No hospitalizations  Follow up in 3 months

## 2018-12-24 NOTE — Progress Notes (Signed)
Follow Up Note  RE: Joanne Sanchez MRN: [id number] DOB: [birth year] Date of Office Visit: 12/24/2018  Referring provider: Maurice Small, MD Primary care provider: Maurice Small, MD  Chief Complaint: Cough (x 3 weeks; night it gets worse some productive green ); Sore Throat; and Headache (tooth pain, pressure )  History of Present Illness: I had the pleasure of seeing Joanne Sanchez for a follow up visit at the [facility] on 12/24/2018. She is a 63 y.o. female, who is being followed for asthma, allergic rhino conjunctivitis. Today she is here for new complaint of sinus problems and coughing. Her previous allergy office visit was on 07/15/2018 with Dr. Neldon Mc.   Asthma:  Patient started with dry cough, sinus headaches, and not feeling well for the past 3 weeks. Patient's granddaughter was ill and believes she is the one who gave her this URI. Only had 1 day of low grade fever. No recent travel.  Now coughing up small amounts of thick phlegm which can be light beige to light green color. Some SOB due to mouth breathing and not sleeping at night. She thinks her CPAP machine is making this worse.   Initially took OTC tylenol with pseudofed which helped but the throat and sinus issues did not improve. She also took some Nyquil and Mucinex as well but the Mucinex causes her to gag on the phlegm.   She also had some toothaches but had dental cleaning recently which was normal.   Currently on Symbicort 80 1 puffs QD and Spiriva 2 puffs QD. She did miss a few doses due to refill issues. She did notice improvement when she restarted her inhaler.  Using albuterol 5 times the last few weeks with good benefit.   No recent prednisone or antibiotics use.   AR/AC: Using Flonase 1 spray BID and azelastine 1 spray BID and saline rinse.  Takes xyzal daily as needed.  On allergy immunotherapy as well with good benefit.  Assessment and Plan: Joanne Sanchez is a 63 y.o. female with: Acute maxillary  sinusitis 3 week history of URI symptoms and now most likely has sinus infection.  Start Augmentin 875mg  twice a day for 10 days. Take with food if it causes upset stomach.  Start prednisone taper.  Start Xhance 2 sprays twice a day. This replaces Flonase. Demonstrated proper use and sample given.  Continue Azelastine 1-2 sprays twice a day as needed for drainage.  Nasal saline spray (i.e., Simply Saline) or nasal saline lavage (i.e., NeilMed) is recommended as needed and prior to medicated nasal sprays.  Seasonal and perennial allergic rhinoconjunctivitis  Resume allergy injections next week when feeling better.  May use over the counter antihistamines such as Zyrtec (cetirizine), Claritin (loratadine), Allegra (fexofenadine), or Xyzal (levocetirizine) daily as needed.  Continue environmental control measures.  Use nasal sprays as recommended above.   Asthma, well controlled Increased coughing with current URI.  Today's spirometry showed: No overt abnormalities noted given today's efforts but patient was coughing and there was also no improvement in FEV1 post bronchodilator treatment but clinically felt better.  Increase Symbicort to 80 2 puffs twice a day for the next 2 weeks. . Daily controller medication(s): Symbicort 80 2 puffs daily with spacer and rinse mouth afterwards. . Prior to physical activity: May use albuterol rescue inhaler 2 puffs 5 to 15 minutes prior to strenuous physical activities. Marland Kitchen Rescue medications: May use albuterol rescue inhaler 2 puffs or nebulizer every 4 to 6 hours as needed  for shortness of breath, chest tightness, coughing, and wheezing. Monitor frequency of use.  . During upper respiratory infections: Start Symbicort 80 2 puffs twice a day with spacer and rinse mouth afterwards for 1-2 weeks.  Return in about 3 months (around 03/24/2019).   Follow up with physician who ordered CPAP. May need re-evaluation.   Meds ordered this encounter    Medications  . amoxicillin-clavulanate (AUGMENTIN) 875-125 MG tablet    Sig: Take 1 tablet by mouth 2 (two) times daily for 10 days.    Dispense:  20 tablet    Refill:  0  . Fluticasone Propionate (XHANCE) 93 MCG/ACT EXHU    Sig: Place 2 puffs into the nose 2 (two) times daily.    Dispense:  32 mL    Refill:  5   Diagnostics: Spirometry:  Tracings reviewed. Her effort: It was hard to get consistent efforts and there is a question as to whether this reflects a maximal maneuver. FVC: 2.53L FEV1: 2.10L, 83% predicted FEV1/FVC ratio: 83% Interpretation: No overt abnormalities noted given today's efforts but patient was coughing and there was also no improvement in FEV1 post bronchodilator treatment but clinically felt better.  Please see scanned spirometry results for details.  Medication List:  Current Outpatient Medications  Medication Sig Dispense Refill  . amLODipine (NORVASC) 5 MG tablet Take 5 mg by mouth daily.    Marland Kitchen azelastine (ASTELIN) 0.1 % nasal spray Place 1 spray into both nostrils 2 (two) times daily. 30 mL 5  . budesonide-formoterol (SYMBICORT) 80-4.5 MCG/ACT inhaler Inhale 2 puffs into the lungs 2 (two) times daily. 1 Inhaler 3  . Cholecalciferol (VITAMIN D-3) 5000 UNITS TABS Take 5,000 Units by mouth daily.    Marland Kitchen MAGNESIUM CHLORIDE-CALCIUM PO Take by mouth daily. Reported on 05/04/2016    . omeprazole (PRILOSEC) 40 MG capsule Take 40 mg by mouth daily.    . pravastatin (PRAVACHOL) 10 MG tablet Take 10 mg by mouth daily.    Marland Kitchen PROAIR HFA 108 (90 Base) MCG/ACT inhaler INHALE 2 PUFFS BY MOUTH EVERY 4 HOURS IF NEEDED FOR COUGH  1  . SPIRIVA RESPIMAT 1.25 MCG/ACT AERS INHALE 2 PUFFS INTO THE LUNGS DAILY 4 g 3  . amoxicillin-clavulanate (AUGMENTIN) 875-125 MG tablet Take 1 tablet by mouth 2 (two) times daily for 10 days. 20 tablet 0  . benzonatate (TESSALON) 200 MG capsule TK 1 C PO BID PRF COUGH  5  . Fluticasone Propionate (XHANCE) 93 MCG/ACT EXHU Place 2 puffs into the nose 2  (two) times daily. 32 mL 5  . ipratropium (ATROVENT) 0.06 % nasal spray INSTILL 2 SPRAYS INTO EACH NOSTRIL 3 TIMES DAILY  0  . levocetirizine (XYZAL) 5 MG tablet TAKE 1 TABLET(5 MG) BY MOUTH EVERY EVENING (Patient not taking: Reported on 12/24/2018) 30 tablet 3   No current facility-administered medications for this visit.    Allergies: Allergies  Allergen Reactions  . Iodinated Diagnostic Agents Hives and Swelling    Prior reaction to ivp contrast Patient pre-medicated w/ 13 hour prep prednisone and benadryl   . Lipitor [Atorvastatin] Other (See Comments)    MUSCLE CRAMPS  . Metformin And Related Diarrhea   I reviewed her past medical history, social history, family history, and environmental history and no significant changes have been reported from previous visit on 07/15/2018.  Review of Systems  Constitutional: Negative for appetite change, chills, fever and unexpected weight change.  HENT: Positive for congestion, postnasal drip and rhinorrhea.   Eyes: Negative for itching.  Respiratory: Positive for cough. Negative for chest tightness, shortness of breath and wheezing.   Gastrointestinal: Negative for abdominal pain.  Skin: Negative for rash.  Allergic/Immunologic: Positive for environmental allergies.  Neurological: Negative for headaches.   Objective: BP 120/70 (BP Location: Left Arm, Patient Position: Sitting, Cuff Size: Normal)   Pulse 78   Temp 97.8 F (36.6 C) (Oral)   Resp 20   SpO2 97%  There is no height or weight on file to calculate BMI. Physical Exam  Constitutional: She is oriented to person, place, and time. She appears well-developed and well-nourished.  HENT:  Head: Normocephalic and atraumatic.  Right Ear: External ear normal.  Left Ear: External ear normal.  Mouth/Throat: Oropharynx is clear and moist.  Left sided nasal mucosal edema  Eyes: Conjunctivae and EOM are normal.  Neck: Neck supple.  Cardiovascular: Normal rate, regular rhythm and normal  heart sounds. Exam reveals no gallop and no friction rub.  No murmur heard. Pulmonary/Chest: Effort normal and breath sounds normal. She has no wheezes. She has no rales.  Lymphadenopathy:    She has no cervical adenopathy.  Neurological: She is alert and oriented to person, place, and time.  Skin: Skin is warm. No rash noted.  Psychiatric: She has a normal mood and affect. Her behavior is normal.  Nursing note and vitals reviewed.  Previous notes and tests were reviewed. The plan was reviewed with the patient/family, and all questions/concerned were addressed.  It was my pleasure to see Joanne Sanchez today and participate in her care. Please feel free to contact me with any questions or concerns.  Sincerely,  Rexene Alberts, DO Allergy & Immunology  [facility] office: [phone] [facility] office: [phone]

## 2018-12-31 ENCOUNTER — Ambulatory Visit: Payer: BLUE CROSS/BLUE SHIELD | Admitting: Allergy

## 2018-12-31 ENCOUNTER — Encounter: Payer: Self-pay | Admitting: Allergy

## 2018-12-31 VITALS — BP 140/88 | HR 68 | Temp 98.2°F | Resp 16

## 2018-12-31 DIAGNOSIS — R5383 Other fatigue: Secondary | ICD-10-CM | POA: Diagnosis not present

## 2018-12-31 DIAGNOSIS — J4540 Moderate persistent asthma, uncomplicated: Secondary | ICD-10-CM

## 2018-12-31 DIAGNOSIS — J302 Other seasonal allergic rhinitis: Secondary | ICD-10-CM | POA: Diagnosis not present

## 2018-12-31 DIAGNOSIS — J3089 Other allergic rhinitis: Secondary | ICD-10-CM

## 2018-12-31 DIAGNOSIS — H1010 Acute atopic conjunctivitis, unspecified eye: Secondary | ICD-10-CM

## 2018-12-31 DIAGNOSIS — J0100 Acute maxillary sinusitis, unspecified: Secondary | ICD-10-CM | POA: Diagnosis not present

## 2018-12-31 MED ORDER — FAMOTIDINE 20 MG PO TABS: 20.0000 mg | ORAL_TABLET | Freq: Every day | ORAL | 5 refills | Status: DC

## 2018-12-31 NOTE — Patient Instructions (Addendum)
   Finish Augmentin antibiotics.   Continue Xhance 2 sprays twice a day. This replaces Flonase.  Continue Azelastine 1-2 sprays twice a day as needed for drainage.  Start prednisone taper.   Start pepcid 20mg  daily.   Nasal saline spray (i.e., Simply Saline) or nasal saline lavage (i.e., NeilMed) is recommended as needed and prior to medicated nasal sprays.  If not feeling better, let me know and we may have to do some further work up.   If still having fatigue then may need to do some bloodwork to rule out other causes.   Seasonal and perennial allergic rhinoconjunctivitis  Resume allergy injections next week when feeling better.  May use over the counter antihistamines such as Zyrtec (cetirizine), Claritin (loratadine), Allegra (fexofenadine), or Xyzal (levocetirizine) daily as needed.  Continue environmental control measures.  Use nasal sprays as recommended above.   Asthma, well controlled   Today's spirometry showed: No overt abnormalities noted given today's efforts.   Daily controller medication(s):Symbicort 80 2 puffs twice daily with spacer and rinse mouth afterwards.  Prior to physical activity:May use albuterol rescue inhaler 2 puffs 5 to 15 minutes prior to strenuous physical activities.  Rescue medications:May use albuterol rescue inhaler 2 puffs or nebulizer every 4 to 6 hours as needed for shortness of breath, chest tightness, coughing, and wheezing. Monitor frequency of use.   During upper respiratory infections: Start Symbicort 80 2 puffs twice a day with spacer and rinse mouth afterwards for 1-2 weeks.  Keep track of infections.  Follow up in 1 months

## 2018-12-31 NOTE — Progress Notes (Signed)
Follow Up Note  RE: KORAL THADEN MRN: [id number] DOB: 12/27/1955 Date of Office Visit: 12/31/2018  Referring provider: Maurice Small, MD Primary care provider: Maurice Small, MD  Chief Complaint: Sinusitis and Nasal Congestion  History of Present Illness: I had the pleasure of seeing Joanne Sanchez for a follow up visit at the [facility] on 12/31/2018. She is a 63 y.o. female, who is being followed for asthma, allergic rhinoconjunctivitis. Today she is here for new complaint of not improving symptoms. Her previous allergy office visit was on 12/24/2018 with Dr. Maudie Mercury.   Patient was doing better after day 3 of prednisone but once the prednisone was finished her symptoms returned with fatigue, stuffy head and tinnitus. Still has 2 days of antibiotics left.  Minimal nasal discharge. Denies any fevers or chills. Still having phlegm in the throat. Using cough drops with good benefit.  No reflux symptoms.   Using Xhance 2 sprays twice a day. Using Symbicort 80 2 puffs BID and albuterol a few times with good benefit.  Patient just feels very fatigued and does not have energy to do what she used to do like working outdoors. This has been very hard on her and feels a little depressed about it.   Assessment and Plan: [name] is a 63 y.o. female with: Acute maxillary sinusitis Symptoms slightly improved while on prednisone but now having similar symptoms.  Finish Augmentin antibiotics.   Continue Xhance 2 sprays twice a day. This replaces Flonase.  Continue Azelastine 1-2 sprays twice a day as needed for drainage.  Start prednisone taper.   Start pepcid 20mg  daily.   Nasal saline spray (i.e., Simply Saline) or nasal saline lavage (i.e., NeilMed) is recommended as needed and prior to medicated nasal sprays.  If not feeling better, let me know and we may have to do some further work up.   Keep track of infections - consider basic immune evaluation.   Asthma, well  controlled  Today's spirometry showed: No overt abnormalities noted given today's efforts. Unchanged from previous one.  . Daily controller medication(s): Symbicort 80 2 puffs BID with spacer and rinse mouth afterwards. . Prior to physical activity: May use albuterol rescue inhaler 2 puffs 5 to 15 minutes prior to strenuous physical activities. Marland Kitchen Rescue medications: May use albuterol rescue inhaler 2 puffs or nebulizer every 4 to 6 hours as needed for shortness of breath, chest tightness, coughing, and wheezing. Monitor frequency of use.  . During upper respiratory infections: Start Symbicort 80 2 puffs twice a day with spacer and rinse mouth afterwards for 1-2 weeks.  Seasonal and perennial allergic rhinoconjunctivitis  Resume allergy injections next week when feeling better.  May use over the counter antihistamines such as Zyrtec (cetirizine), Claritin (loratadine), Allegra (fexofenadine), or Xyzal (levocetirizine) daily as needed.  Continue environmental control measures.  Use nasal sprays as recommended above.   Other fatigue Monitor symptoms.  If fatigue is still persistent then follow up with PCP.  Return in about 4 weeks (around 01/28/2019).  Meds ordered this encounter  Medications  . famotidine (PEPCID) 20 MG tablet    Sig: Take 1 tablet (20 mg total) by mouth daily.    Dispense:  30 tablet    Refill:  5   Diagnostics: Spirometry:  Tracings reviewed. Her effort: It was hard to get consistent efforts and there is a question as to whether this reflects a maximal maneuver. FVC: 2.57L FEV1: 2.08L, 82% predicted FEV1/FVC ratio: 81% Interpretation: Spirometry  consistent with possible restrictive disease.  Please see scanned spirometry results for details.  Medication List:  Current Outpatient Medications  Medication Sig Dispense Refill  . amLODipine (NORVASC) 5 MG tablet Take 5 mg by mouth daily.    Marland Kitchen amoxicillin-clavulanate (AUGMENTIN) 875-125 MG tablet Take 1 tablet  by mouth 2 (two) times daily for 10 days. 20 tablet 0  . azelastine (ASTELIN) 0.1 % nasal spray Place 1 spray into both nostrils 2 (two) times daily. 30 mL 5  . benzonatate (TESSALON) 200 MG capsule TK 1 C PO BID PRF COUGH  5  . budesonide-formoterol (SYMBICORT) 80-4.5 MCG/ACT inhaler Inhale 2 puffs into the lungs 2 (two) times daily. 1 Inhaler 3  . Cholecalciferol (VITAMIN D-3) 5000 UNITS TABS Take 5,000 Units by mouth daily.    . Fluticasone Propionate (XHANCE) 93 MCG/ACT EXHU Place 2 puffs into the nose 2 (two) times daily. 32 mL 5  . ipratropium (ATROVENT) 0.06 % nasal spray INSTILL 2 SPRAYS INTO EACH NOSTRIL 3 TIMES DAILY  0  . levocetirizine (XYZAL) 5 MG tablet TAKE 1 TABLET(5 MG) BY MOUTH EVERY EVENING 30 tablet 3  . MAGNESIUM CHLORIDE-CALCIUM PO Take by mouth daily. Reported on 05/04/2016    . omeprazole (PRILOSEC) 40 MG capsule Take 40 mg by mouth daily.    . pravastatin (PRAVACHOL) 10 MG tablet Take 10 mg by mouth daily.    Marland Kitchen PROAIR HFA 108 (90 Base) MCG/ACT inhaler INHALE 2 PUFFS BY MOUTH EVERY 4 HOURS IF NEEDED FOR COUGH  1  . SPIRIVA RESPIMAT 1.25 MCG/ACT AERS INHALE 2 PUFFS INTO THE LUNGS DAILY 4 g 3  . famotidine (PEPCID) 20 MG tablet Take 1 tablet (20 mg total) by mouth daily. 30 tablet 5   No current facility-administered medications for this visit.    Allergies: Allergies  Allergen Reactions  . Iodinated Diagnostic Agents Hives and Swelling    Prior reaction to ivp contrast Patient pre-medicated w/ 13 hour prep prednisone and benadryl   . Lipitor [Atorvastatin] Other (See Comments)    MUSCLE CRAMPS  . Metformin And Related Diarrhea   I reviewed her past medical history, social history, family history, and environmental history and no significant changes have been reported from previous visit on 12/24/2018.  Review of Systems  Constitutional: Positive for fatigue. Negative for appetite change, chills, fever and unexpected weight change.  HENT: Positive for congestion,  postnasal drip, rhinorrhea and sinus pain.   Eyes: Negative for itching.  Respiratory: Positive for cough. Negative for chest tightness, shortness of breath and wheezing.   Gastrointestinal: Negative for abdominal pain.  Skin: Negative for rash.  Allergic/Immunologic: Positive for environmental allergies.  Neurological: Positive for headaches.   Objective: BP 140/88   Pulse 68   Temp 98.2 F (36.8 C)   Resp 16   SpO2 96%  There is no height or weight on file to calculate BMI. Physical Exam  Constitutional: She is oriented to person, place, and time. She appears well-developed and well-nourished.  HENT:  Head: Normocephalic and atraumatic.  Right Ear: External ear normal.  Left Ear: External ear normal.  Mouth/Throat: Oropharynx is clear and moist.  Eyes: Conjunctivae and EOM are normal.  Neck: Neck supple.  Cardiovascular: Normal rate, regular rhythm and normal heart sounds. Exam reveals no gallop and no friction rub.  No murmur heard. Pulmonary/Chest: Effort normal and breath sounds normal. She has no wheezes. She has no rales.  Lymphadenopathy:    She has no cervical adenopathy.  Neurological: She is  alert and oriented to person, place, and time.  Skin: Skin is warm. No rash noted.  Psychiatric: She has a normal mood and affect. Her behavior is normal.  Nursing note and vitals reviewed.  Previous notes and tests were reviewed. The plan was reviewed with the patient/family, and all questions/concerned were addressed.  It was my pleasure to see Joanne Sanchez today and participate in her care. Please feel free to contact me with any questions or concerns.  Sincerely,  Rexene Alberts, DO Allergy & Immunology  [facility] office: [phone] [facility] office: [phone]

## 2018-12-31 NOTE — Assessment & Plan Note (Signed)
Monitor symptoms.  If fatigue is still persistent then follow up with PCP.

## 2018-12-31 NOTE — Assessment & Plan Note (Addendum)
Symptoms slightly improved while on prednisone but now having similar symptoms.  Finish Augmentin antibiotics.   Continue Xhance 2 sprays twice a day. This replaces Flonase.  Continue Azelastine 1-2 sprays twice a day as needed for drainage.  Start prednisone taper.   Start pepcid 20mg  daily.   Nasal saline spray (i.e., Simply Saline) or nasal saline lavage (i.e., NeilMed) is recommended as needed and prior to medicated nasal sprays.  If not feeling better, let me know and we may have to do some further work up.   Keep track of infections - consider basic immune evaluation.

## 2018-12-31 NOTE — Assessment & Plan Note (Signed)
   Today's spirometry showed: No overt abnormalities noted given today's efforts. Unchanged from previous one.  . Daily controller medication(s): Symbicort 80 2 puffs BID with spacer and rinse mouth afterwards. . Prior to physical activity: May use albuterol rescue inhaler 2 puffs 5 to 15 minutes prior to strenuous physical activities. Marland Kitchen Rescue medications: May use albuterol rescue inhaler 2 puffs or nebulizer every 4 to 6 hours as needed for shortness of breath, chest tightness, coughing, and wheezing. Monitor frequency of use.  . During upper respiratory infections: Start Symbicort 80 2 puffs twice a day with spacer and rinse mouth afterwards for 1-2 weeks.

## 2018-12-31 NOTE — Assessment & Plan Note (Signed)
   Resume allergy injections next week when feeling better.  May use over the counter antihistamines such as Zyrtec (cetirizine), Claritin (loratadine), Allegra (fexofenadine), or Xyzal (levocetirizine) daily as needed.  Continue environmental control measures.  Use nasal sprays as recommended above.

## 2019-01-15 ENCOUNTER — Other Ambulatory Visit: Payer: Self-pay | Admitting: *Deleted

## 2019-01-15 MED ORDER — AZELASTINE HCL 0.1 % NA SOLN: 1.0000 | Freq: Two times a day (BID) | NASAL | 1 refills | Status: DC

## 2019-01-15 MED ORDER — PROAIR HFA 108 (90 BASE) MCG/ACT IN AERS: 2.0000 | INHALATION_SPRAY | RESPIRATORY_TRACT | 1 refills | Status: DC | PRN

## 2019-01-15 MED ORDER — FLUTICASONE PROPIONATE 50 MCG/ACT NA SUSP: 2.0000 | Freq: Every day | NASAL | 1 refills | Status: DC

## 2019-01-15 MED ORDER — TIOTROPIUM BROMIDE MONOHYDRATE 1.25 MCG/ACT IN AERS: 2.0000 | INHALATION_SPRAY | Freq: Every day | RESPIRATORY_TRACT | 1 refills | Status: DC

## 2019-01-15 MED ORDER — BUDESONIDE-FORMOTEROL FUMARATE 80-4.5 MCG/ACT IN AERO: 2.0000 | INHALATION_SPRAY | Freq: Two times a day (BID) | RESPIRATORY_TRACT | 1 refills | Status: DC

## 2019-01-20 ENCOUNTER — Telehealth: Payer: Self-pay | Admitting: Allergy and Immunology

## 2019-01-20 ENCOUNTER — Other Ambulatory Visit: Payer: Self-pay

## 2019-01-20 ENCOUNTER — Ambulatory Visit (INDEPENDENT_AMBULATORY_CARE_PROVIDER_SITE_OTHER): Payer: BLUE CROSS/BLUE SHIELD | Admitting: *Deleted

## 2019-01-20 DIAGNOSIS — J309 Allergic rhinitis, unspecified: Secondary | ICD-10-CM

## 2019-01-20 MED ORDER — LEVOCETIRIZINE DIHYDROCHLORIDE 5 MG PO TABS: ORAL_TABLET | ORAL | 1 refills | Status: DC

## 2019-01-20 MED ORDER — AZELASTINE HCL 0.1 % NA SOLN: 1.0000 | Freq: Two times a day (BID) | NASAL | 1 refills | Status: DC

## 2019-01-20 MED ORDER — FLUTICASONE PROPIONATE 50 MCG/ACT NA SUSP: 2.0000 | Freq: Every day | NASAL | 1 refills | Status: DC

## 2019-01-20 MED ORDER — IPRATROPIUM BROMIDE 0.06 % NA SOLN: NASAL | 1 refills | Status: DC

## 2019-01-20 MED ORDER — BUDESONIDE-FORMOTEROL FUMARATE 80-4.5 MCG/ACT IN AERO: 2.0000 | INHALATION_SPRAY | Freq: Two times a day (BID) | RESPIRATORY_TRACT | 1 refills | Status: DC

## 2019-01-20 MED ORDER — FAMOTIDINE 20 MG PO TABS: 20.0000 mg | ORAL_TABLET | Freq: Every day | ORAL | 1 refills | Status: DC

## 2019-01-20 MED ORDER — PROAIR HFA 108 (90 BASE) MCG/ACT IN AERS: 2.0000 | INHALATION_SPRAY | RESPIRATORY_TRACT | 0 refills | Status: DC | PRN

## 2019-01-20 NOTE — Telephone Encounter (Signed)
Pt called and said that she wants 90 supply of her rx. She needs astelin,symbicort.flonase, atrovent, xyzal,proair.sent to [facility] on [address]. [phone].

## 2019-01-20 NOTE — Telephone Encounter (Signed)
Called pt to get clarity on xhance and flonase. Pt states that she received an xhance sample at her last ov but did not get an rx. She would like to continue Flonase at this time. 90 supplies sent to pharmacy on file.

## 2019-01-30 ENCOUNTER — Ambulatory Visit (INDEPENDENT_AMBULATORY_CARE_PROVIDER_SITE_OTHER): Payer: BLUE CROSS/BLUE SHIELD | Admitting: *Deleted

## 2019-01-30 DIAGNOSIS — J309 Allergic rhinitis, unspecified: Secondary | ICD-10-CM | POA: Diagnosis not present

## 2019-02-02 ENCOUNTER — Other Ambulatory Visit: Payer: Self-pay | Admitting: Allergy and Immunology

## 2019-02-04 ENCOUNTER — Ambulatory Visit: Payer: BLUE CROSS/BLUE SHIELD | Admitting: Allergy

## 2019-02-06 ENCOUNTER — Ambulatory Visit (INDEPENDENT_AMBULATORY_CARE_PROVIDER_SITE_OTHER): Payer: BLUE CROSS/BLUE SHIELD | Admitting: *Deleted

## 2019-02-06 DIAGNOSIS — J309 Allergic rhinitis, unspecified: Secondary | ICD-10-CM | POA: Diagnosis not present

## 2019-02-12 ENCOUNTER — Ambulatory Visit (INDEPENDENT_AMBULATORY_CARE_PROVIDER_SITE_OTHER): Payer: BLUE CROSS/BLUE SHIELD

## 2019-02-12 DIAGNOSIS — J309 Allergic rhinitis, unspecified: Secondary | ICD-10-CM

## 2019-02-20 ENCOUNTER — Ambulatory Visit (INDEPENDENT_AMBULATORY_CARE_PROVIDER_SITE_OTHER): Payer: BLUE CROSS/BLUE SHIELD | Admitting: *Deleted

## 2019-02-20 DIAGNOSIS — J309 Allergic rhinitis, unspecified: Secondary | ICD-10-CM | POA: Diagnosis not present

## 2019-02-27 ENCOUNTER — Ambulatory Visit (INDEPENDENT_AMBULATORY_CARE_PROVIDER_SITE_OTHER): Payer: BLUE CROSS/BLUE SHIELD | Admitting: *Deleted

## 2019-02-27 DIAGNOSIS — J309 Allergic rhinitis, unspecified: Secondary | ICD-10-CM | POA: Diagnosis not present

## 2019-03-06 ENCOUNTER — Ambulatory Visit (INDEPENDENT_AMBULATORY_CARE_PROVIDER_SITE_OTHER): Payer: BLUE CROSS/BLUE SHIELD | Admitting: *Deleted

## 2019-03-06 DIAGNOSIS — J309 Allergic rhinitis, unspecified: Secondary | ICD-10-CM | POA: Diagnosis not present

## 2019-03-10 ENCOUNTER — Ambulatory Visit (INDEPENDENT_AMBULATORY_CARE_PROVIDER_SITE_OTHER): Payer: BLUE CROSS/BLUE SHIELD | Admitting: *Deleted

## 2019-03-10 DIAGNOSIS — J309 Allergic rhinitis, unspecified: Secondary | ICD-10-CM | POA: Diagnosis not present

## 2019-03-18 ENCOUNTER — Ambulatory Visit (INDEPENDENT_AMBULATORY_CARE_PROVIDER_SITE_OTHER): Payer: BLUE CROSS/BLUE SHIELD

## 2019-03-18 DIAGNOSIS — J309 Allergic rhinitis, unspecified: Secondary | ICD-10-CM

## 2019-03-18 NOTE — Progress Notes (Signed)
VIALS EXP 03-17-2020 

## 2019-03-19 DIAGNOSIS — J3089 Other allergic rhinitis: Secondary | ICD-10-CM | POA: Diagnosis not present

## 2019-03-24 ENCOUNTER — Other Ambulatory Visit: Payer: Self-pay

## 2019-03-24 ENCOUNTER — Ambulatory Visit (INDEPENDENT_AMBULATORY_CARE_PROVIDER_SITE_OTHER): Payer: BLUE CROSS/BLUE SHIELD | Admitting: Allergy and Immunology

## 2019-03-24 ENCOUNTER — Telehealth: Payer: Self-pay | Admitting: *Deleted

## 2019-03-24 ENCOUNTER — Ambulatory Visit: Payer: Self-pay

## 2019-03-24 DIAGNOSIS — J3089 Other allergic rhinitis: Secondary | ICD-10-CM

## 2019-03-24 DIAGNOSIS — K219 Gastro-esophageal reflux disease without esophagitis: Secondary | ICD-10-CM

## 2019-03-24 DIAGNOSIS — J4540 Moderate persistent asthma, uncomplicated: Secondary | ICD-10-CM

## 2019-03-24 DIAGNOSIS — J324 Chronic pansinusitis: Secondary | ICD-10-CM | POA: Diagnosis not present

## 2019-03-24 DIAGNOSIS — J309 Allergic rhinitis, unspecified: Secondary | ICD-10-CM

## 2019-03-24 MED ORDER — OMEPRAZOLE 40 MG PO CPDR: 40.0000 mg | DELAYED_RELEASE_CAPSULE | Freq: Every day | ORAL | 1 refills | Status: DC

## 2019-03-24 MED ORDER — AZELASTINE HCL 0.1 % NA SOLN: 1.0000 | Freq: Two times a day (BID) | NASAL | 1 refills | Status: DC

## 2019-03-24 NOTE — Telephone Encounter (Signed)
error 

## 2019-03-24 NOTE — Patient Instructions (Addendum)
  1.  Continue to Perform allergen avoidance measures     2.  Continue to Treat and prevent inflammation:   A.  Flonase 1 sprays each nostril 2 times per day  B.  Azelastine 1 spray each nostril 2 times per day  C.  Symbicort 80 -2 inhalations 1-2 times per day  D.  Spiriva 1.25 Respimat - 2 inhalations 1 time per day   3. Continue to treat reflux / LPR:   A. Omeprazole 40 mg in AM  B. Famotidine 40 mg in PM  4. If needed:   A. LevoCetirizine 5mg  one tablet 1-2 times per day (additional dose on day of ITX)  B. Proair HFA or similar 2 inhalations every 4-6 hours if needed  C. Eye drops  D. Nasal saline  5. Continue immunotherapy (Auvi-Q)  6. Return to clinic in 6 months or earlier if problem

## 2019-03-24 NOTE — Progress Notes (Signed)
[place]   Follow-up Note  Referring Provider: Maurice Small, MD Primary Provider: Maurice Small, MD Date of Office Visit: 03/24/2019  Subjective:   JAZLINE CUMBEE (DOB: [birth year]) is a 63 y.o. female who returns to the [facility] on 03/24/2019 in re-evaluation of the following:  HPI: This is a E - Mmed visit requested by patient who is located at home.  Howard is followed in this clinic for asthma and allergic rhinitis and a history of chronic sinusitis and reflux.  Her last visit to this clinic with me was 15 July 2018.  She did visit with Dr. Maudie Mercury on 31 December 2018 in evaluation and treatment of an episode of sinusitis.  Receiving immunotherapy every week without adverse effect other than occasional lage local reactions. Using levocetirizine at night on a regular basis.  Asthma doing well without need for systemic steroid. Using SABA very rarely. Using Symbicort two times per day and Spiriva daily.   Nose doing well presently. Using flonase and azelastine two times per day.  Has been having muscle ache and cramps since using cholesterol medication. To discontinue and discuss with primary care doctor.   Using combination of PPI and H2RB which helps her throat.   Allergies as of 03/24/2019      Reactions   Iodinated Diagnostic Agents Hives, Swelling   Prior reaction to ivp contrast Patient pre-medicated w/ 13 hour prep prednisone and benadryl   Lipitor [atorvastatin] Other (See Comments)   MUSCLE CRAMPS   Metformin And Related Diarrhea      Medication List      amLODipine 5 MG tablet Commonly known as:  NORVASC Take 5 mg by mouth daily.   azelastine 0.1 % nasal spray Commonly known as:  ASTELIN USE 1 SPRAY IN EACH NOSTRIL TWICE DAILY   budesonide-formoterol 80-4.5 MCG/ACT inhaler Commonly known as:  Symbicort Inhale 2 puffs into the lungs 2 (two) times daily.   famotidine 20 MG tablet Commonly  known as:  Pepcid Take 1 tablet (20 mg total) by mouth daily.   fluticasone 50 MCG/ACT nasal spray Commonly known as:  FLONASE Place 2 sprays into both nostrils daily.   levocetirizine 5 MG tablet Commonly known as:  XYZAL TAKE 1 TABLET(5 MG) BY MOUTH EVERY EVENING   MAGNESIUM CHLORIDE-CALCIUM PO Take by mouth daily. Reported on 05/04/2016   omeprazole 40 MG capsule Commonly known as:  PRILOSEC Take 40 mg by mouth daily.   pravastatin 10 MG tablet Commonly known as:  PRAVACHOL Take 10 mg by mouth daily.   ProAir HFA 108 (90 Base) MCG/ACT inhaler Generic drug:  albuterol Inhale 2 puffs into the lungs every 4 (four) hours as needed for wheezing or shortness of breath.   Tiotropium Bromide Monohydrate 1.25 MCG/ACT Aers Commonly known as:  Spiriva Respimat Inhale 2 puffs into the lungs daily.   Vitamin D-3 125 MCG (5000 UT) Tabs Take 5,000 Units by mouth daily.       Past Medical History:  Diagnosis Date  . Allergy   . Anemia   . Asthma   . Environmental allergies   . GERD (gastroesophageal reflux disease)   . Hyperlipidemia   . Hypertension   . Pre-diabetes   . Recurrent upper respiratory infection (URI)   . Vertigo     Past Surgical History:  Procedure Laterality Date  . ABDOMINAL HYSTERECTOMY  2000   TAH/BSO  . WISDOM TOOTH EXTRACTION  1990's  Review of systems negative except as noted in HPI / PMHx or noted below:  Review of Systems  Constitutional: Negative.   HENT: Negative.   Eyes: Negative.   Respiratory: Negative.   Cardiovascular: Negative.   Gastrointestinal: Negative.   Genitourinary: Negative.   Musculoskeletal: Negative.   Skin: Negative.   Neurological: Negative.   Endo/Heme/Allergies: Negative.   Psychiatric/Behavioral: Negative.      Objective:   There were no vitals filed for this visit.        Physical Exam-deferred  Diagnostics: none  Assessment and Plan:   1. Asthma, moderate persistent, well-controlled   2.  Perennial allergic rhinitis   3. Chronic pansinusitis   4. LPRD (laryngopharyngeal reflux disease)     1.  Continue to Perform allergen avoidance measures     2.  Continue to Treat and prevent inflammation:   A.  Flonase 1 sprays each nostril 2 times per day  B.  Azelastine 1 spray each nostril 2 times per day  C.  Symbicort 80 -2 inhalations 1-2 times per day  D.  Spiriva 1.25 Respimat - 2 inhalations 1 time per day   3. Continue to treat reflux / LPR:   A. Omeprazole 40 mg in AM  B. Famotidine 40 mg in PM  4. If needed:   A. Levocetirizine 5mg  one tablet 1-2 time per day (additional dose on morning of ITX)  B. Proair HFA or similar 2 inhalations every 4-6 hours if needed  C. Eye drops  D. Nasal saline  5. Continue immunotherapy (Auvi-Q)  6. Return to clinic in 6 months or earlier if problem  Overall, Emmaleah has been doing well with her current plan which includes anti-inflammatory medications for her airway and immunotherapy and therapy directed at reflux. I will have her use an additional dose of levo cetirizine on the day of her immunotherapy as she does appear to develop some large local reactions with this form of treatment.  Assuming she does well I will see her back in this clinic in 6 months or earlier if there is a problem.  Total patient interaction time 20 minutes  Allena Katz, MD Allergy / [place]

## 2019-03-25 ENCOUNTER — Encounter: Payer: Self-pay | Admitting: Allergy and Immunology

## 2019-03-31 NOTE — Progress Notes (Signed)
Patient is at home. Provider is in office.  Start Time: 201 pm End Time: 226 pm

## 2019-04-03 ENCOUNTER — Ambulatory Visit (INDEPENDENT_AMBULATORY_CARE_PROVIDER_SITE_OTHER): Payer: BLUE CROSS/BLUE SHIELD | Admitting: *Deleted

## 2019-04-03 DIAGNOSIS — J309 Allergic rhinitis, unspecified: Secondary | ICD-10-CM

## 2019-04-08 DIAGNOSIS — R7303 Prediabetes: Secondary | ICD-10-CM | POA: Diagnosis not present

## 2019-04-08 DIAGNOSIS — Z0000 Encounter for general adult medical examination without abnormal findings: Secondary | ICD-10-CM | POA: Diagnosis not present

## 2019-04-08 DIAGNOSIS — I10 Essential (primary) hypertension: Secondary | ICD-10-CM | POA: Diagnosis not present

## 2019-04-08 DIAGNOSIS — E785 Hyperlipidemia, unspecified: Secondary | ICD-10-CM | POA: Diagnosis not present

## 2019-04-10 ENCOUNTER — Ambulatory Visit (INDEPENDENT_AMBULATORY_CARE_PROVIDER_SITE_OTHER): Payer: BLUE CROSS/BLUE SHIELD

## 2019-04-10 DIAGNOSIS — J309 Allergic rhinitis, unspecified: Secondary | ICD-10-CM

## 2019-04-17 ENCOUNTER — Telehealth: Payer: Self-pay

## 2019-04-17 NOTE — Telephone Encounter (Signed)
Patient is requesting a change from Spiriva to something cheaper. I let her know that she could contact her insurance company and find out which ones her specific prescription benefit manager will cover at a lower cost. She is agreeable with this and will call back and let us know which ones will be cheaper.

## 2019-04-21 ENCOUNTER — Ambulatory Visit (INDEPENDENT_AMBULATORY_CARE_PROVIDER_SITE_OTHER): Payer: BLUE CROSS/BLUE SHIELD | Admitting: *Deleted

## 2019-04-21 DIAGNOSIS — J309 Allergic rhinitis, unspecified: Secondary | ICD-10-CM

## 2019-04-30 ENCOUNTER — Ambulatory Visit (INDEPENDENT_AMBULATORY_CARE_PROVIDER_SITE_OTHER): Payer: BLUE CROSS/BLUE SHIELD

## 2019-04-30 DIAGNOSIS — J309 Allergic rhinitis, unspecified: Secondary | ICD-10-CM

## 2019-05-06 ENCOUNTER — Ambulatory Visit (INDEPENDENT_AMBULATORY_CARE_PROVIDER_SITE_OTHER): Payer: BLUE CROSS/BLUE SHIELD | Admitting: *Deleted

## 2019-05-06 DIAGNOSIS — Z1231 Encounter for screening mammogram for malignant neoplasm of breast: Secondary | ICD-10-CM | POA: Diagnosis not present

## 2019-05-06 DIAGNOSIS — J309 Allergic rhinitis, unspecified: Secondary | ICD-10-CM

## 2019-05-14 ENCOUNTER — Ambulatory Visit (INDEPENDENT_AMBULATORY_CARE_PROVIDER_SITE_OTHER): Payer: BLUE CROSS/BLUE SHIELD | Admitting: *Deleted

## 2019-05-14 DIAGNOSIS — J309 Allergic rhinitis, unspecified: Secondary | ICD-10-CM

## 2019-05-21 ENCOUNTER — Ambulatory Visit (INDEPENDENT_AMBULATORY_CARE_PROVIDER_SITE_OTHER): Payer: BLUE CROSS/BLUE SHIELD | Admitting: *Deleted

## 2019-05-21 DIAGNOSIS — J309 Allergic rhinitis, unspecified: Secondary | ICD-10-CM | POA: Diagnosis not present

## 2019-05-27 DIAGNOSIS — J3089 Other allergic rhinitis: Secondary | ICD-10-CM | POA: Diagnosis not present

## 2019-05-27 NOTE — Progress Notes (Signed)
VIALS EXP 05-26-2020 

## 2019-05-28 ENCOUNTER — Ambulatory Visit (INDEPENDENT_AMBULATORY_CARE_PROVIDER_SITE_OTHER): Payer: BLUE CROSS/BLUE SHIELD | Admitting: *Deleted

## 2019-05-28 DIAGNOSIS — J309 Allergic rhinitis, unspecified: Secondary | ICD-10-CM

## 2019-06-08 ENCOUNTER — Ambulatory Visit (INDEPENDENT_AMBULATORY_CARE_PROVIDER_SITE_OTHER): Payer: BC Managed Care – PPO | Admitting: *Deleted

## 2019-06-08 DIAGNOSIS — J309 Allergic rhinitis, unspecified: Secondary | ICD-10-CM

## 2019-06-18 ENCOUNTER — Ambulatory Visit (INDEPENDENT_AMBULATORY_CARE_PROVIDER_SITE_OTHER): Payer: BC Managed Care – PPO | Admitting: *Deleted

## 2019-06-18 DIAGNOSIS — J309 Allergic rhinitis, unspecified: Secondary | ICD-10-CM | POA: Diagnosis not present

## 2019-06-19 DIAGNOSIS — S39012A Strain of muscle, fascia and tendon of lower back, initial encounter: Secondary | ICD-10-CM | POA: Diagnosis not present

## 2019-06-26 ENCOUNTER — Ambulatory Visit (INDEPENDENT_AMBULATORY_CARE_PROVIDER_SITE_OTHER): Payer: BC Managed Care – PPO

## 2019-06-26 DIAGNOSIS — J309 Allergic rhinitis, unspecified: Secondary | ICD-10-CM | POA: Diagnosis not present

## 2019-07-07 ENCOUNTER — Ambulatory Visit (INDEPENDENT_AMBULATORY_CARE_PROVIDER_SITE_OTHER): Payer: BC Managed Care – PPO | Admitting: *Deleted

## 2019-07-07 DIAGNOSIS — J309 Allergic rhinitis, unspecified: Secondary | ICD-10-CM

## 2019-07-17 ENCOUNTER — Ambulatory Visit (INDEPENDENT_AMBULATORY_CARE_PROVIDER_SITE_OTHER): Payer: BC Managed Care – PPO

## 2019-07-17 DIAGNOSIS — J309 Allergic rhinitis, unspecified: Secondary | ICD-10-CM | POA: Diagnosis not present

## 2019-07-21 DIAGNOSIS — H2513 Age-related nuclear cataract, bilateral: Secondary | ICD-10-CM | POA: Diagnosis not present

## 2019-07-21 DIAGNOSIS — H43813 Vitreous degeneration, bilateral: Secondary | ICD-10-CM | POA: Diagnosis not present

## 2019-07-23 ENCOUNTER — Ambulatory Visit (INDEPENDENT_AMBULATORY_CARE_PROVIDER_SITE_OTHER): Payer: BC Managed Care – PPO

## 2019-07-23 DIAGNOSIS — J309 Allergic rhinitis, unspecified: Secondary | ICD-10-CM | POA: Diagnosis not present

## 2019-07-31 ENCOUNTER — Ambulatory Visit (INDEPENDENT_AMBULATORY_CARE_PROVIDER_SITE_OTHER): Payer: BC Managed Care – PPO

## 2019-07-31 ENCOUNTER — Other Ambulatory Visit: Payer: Self-pay | Admitting: Allergy and Immunology

## 2019-07-31 DIAGNOSIS — J309 Allergic rhinitis, unspecified: Secondary | ICD-10-CM

## 2019-08-12 ENCOUNTER — Other Ambulatory Visit: Payer: Self-pay | Admitting: Allergy

## 2019-08-18 ENCOUNTER — Ambulatory Visit (INDEPENDENT_AMBULATORY_CARE_PROVIDER_SITE_OTHER): Payer: BC Managed Care – PPO | Admitting: *Deleted

## 2019-08-18 DIAGNOSIS — J309 Allergic rhinitis, unspecified: Secondary | ICD-10-CM | POA: Diagnosis not present

## 2019-09-02 ENCOUNTER — Ambulatory Visit (INDEPENDENT_AMBULATORY_CARE_PROVIDER_SITE_OTHER): Payer: BC Managed Care – PPO

## 2019-09-02 DIAGNOSIS — J309 Allergic rhinitis, unspecified: Secondary | ICD-10-CM | POA: Diagnosis not present

## 2019-09-16 ENCOUNTER — Ambulatory Visit (INDEPENDENT_AMBULATORY_CARE_PROVIDER_SITE_OTHER): Payer: BC Managed Care – PPO | Admitting: *Deleted

## 2019-09-16 DIAGNOSIS — J309 Allergic rhinitis, unspecified: Secondary | ICD-10-CM | POA: Diagnosis not present

## 2019-09-22 NOTE — Progress Notes (Signed)
VIALS EXP 09-21-20

## 2019-09-23 DIAGNOSIS — J3081 Allergic rhinitis due to animal (cat) (dog) hair and dander: Secondary | ICD-10-CM | POA: Diagnosis not present

## 2019-09-25 DIAGNOSIS — E785 Hyperlipidemia, unspecified: Secondary | ICD-10-CM | POA: Diagnosis not present

## 2019-10-06 ENCOUNTER — Ambulatory Visit (INDEPENDENT_AMBULATORY_CARE_PROVIDER_SITE_OTHER): Payer: BC Managed Care – PPO | Admitting: *Deleted

## 2019-10-06 DIAGNOSIS — J309 Allergic rhinitis, unspecified: Secondary | ICD-10-CM

## 2019-10-23 ENCOUNTER — Ambulatory Visit (INDEPENDENT_AMBULATORY_CARE_PROVIDER_SITE_OTHER): Payer: BC Managed Care – PPO | Admitting: *Deleted

## 2019-10-23 DIAGNOSIS — J309 Allergic rhinitis, unspecified: Secondary | ICD-10-CM | POA: Diagnosis not present

## 2019-11-12 ENCOUNTER — Ambulatory Visit (INDEPENDENT_AMBULATORY_CARE_PROVIDER_SITE_OTHER): Payer: PPO

## 2019-11-12 DIAGNOSIS — J309 Allergic rhinitis, unspecified: Secondary | ICD-10-CM | POA: Diagnosis not present

## 2019-11-25 ENCOUNTER — Ambulatory Visit (INDEPENDENT_AMBULATORY_CARE_PROVIDER_SITE_OTHER): Payer: PPO

## 2019-11-25 DIAGNOSIS — J309 Allergic rhinitis, unspecified: Secondary | ICD-10-CM

## 2019-12-02 ENCOUNTER — Telehealth: Payer: Self-pay | Admitting: Allergy and Immunology

## 2019-12-02 ENCOUNTER — Ambulatory Visit (INDEPENDENT_AMBULATORY_CARE_PROVIDER_SITE_OTHER): Payer: PPO | Admitting: *Deleted

## 2019-12-02 DIAGNOSIS — J309 Allergic rhinitis, unspecified: Secondary | ICD-10-CM

## 2019-12-02 NOTE — Telephone Encounter (Signed)
Dr. Neldon Mc please advise...Marland KitchenMarland KitchenMarland Kitchen  Patient is having dry eyes. She has been buying over the counter medication, but thinks she needs something stronger. [facility] on [address].

## 2019-12-02 NOTE — Telephone Encounter (Signed)
Please inform patient that we usually recommend using OTC Systane with the thick drops before bedtime and the regular drops throughout the day and trying to avoid all oral antihistamines if she has dry eyes.  If that does not work then she needs to see a ophthalmologist.

## 2019-12-02 NOTE — Telephone Encounter (Signed)
Patient is having dry eyes. She has been buying over the counter medication, but thinks she needs something stronger. [facility] on [address].

## 2019-12-03 NOTE — Telephone Encounter (Signed)
Called and left voicemail asking for patient to return call to discuss.

## 2019-12-07 NOTE — Telephone Encounter (Signed)
Called patient and advised. Patient verbalized understanding and will seek care from an Ophthalmologist.

## 2019-12-10 ENCOUNTER — Ambulatory Visit (INDEPENDENT_AMBULATORY_CARE_PROVIDER_SITE_OTHER): Payer: PPO

## 2019-12-10 DIAGNOSIS — J309 Allergic rhinitis, unspecified: Secondary | ICD-10-CM

## 2019-12-18 ENCOUNTER — Ambulatory Visit (INDEPENDENT_AMBULATORY_CARE_PROVIDER_SITE_OTHER): Payer: PPO | Admitting: *Deleted

## 2019-12-18 DIAGNOSIS — J309 Allergic rhinitis, unspecified: Secondary | ICD-10-CM

## 2019-12-23 ENCOUNTER — Ambulatory Visit (INDEPENDENT_AMBULATORY_CARE_PROVIDER_SITE_OTHER): Payer: PPO

## 2019-12-23 DIAGNOSIS — J309 Allergic rhinitis, unspecified: Secondary | ICD-10-CM

## 2020-01-08 ENCOUNTER — Ambulatory Visit (INDEPENDENT_AMBULATORY_CARE_PROVIDER_SITE_OTHER): Payer: PPO

## 2020-01-08 DIAGNOSIS — J309 Allergic rhinitis, unspecified: Secondary | ICD-10-CM | POA: Diagnosis not present

## 2020-01-29 ENCOUNTER — Ambulatory Visit (INDEPENDENT_AMBULATORY_CARE_PROVIDER_SITE_OTHER): Payer: PPO | Admitting: *Deleted

## 2020-01-29 DIAGNOSIS — J309 Allergic rhinitis, unspecified: Secondary | ICD-10-CM | POA: Diagnosis not present

## 2020-02-17 ENCOUNTER — Ambulatory Visit (INDEPENDENT_AMBULATORY_CARE_PROVIDER_SITE_OTHER): Payer: PPO

## 2020-02-17 DIAGNOSIS — J309 Allergic rhinitis, unspecified: Secondary | ICD-10-CM

## 2020-02-25 DIAGNOSIS — J3081 Allergic rhinitis due to animal (cat) (dog) hair and dander: Secondary | ICD-10-CM | POA: Diagnosis not present

## 2020-02-25 NOTE — Progress Notes (Signed)
VIALS EXP 02-24-21 

## 2020-02-29 DIAGNOSIS — J3089 Other allergic rhinitis: Secondary | ICD-10-CM

## 2020-03-01 ENCOUNTER — Ambulatory Visit (INDEPENDENT_AMBULATORY_CARE_PROVIDER_SITE_OTHER): Payer: PPO

## 2020-03-01 DIAGNOSIS — J309 Allergic rhinitis, unspecified: Secondary | ICD-10-CM | POA: Diagnosis not present

## 2020-03-15 ENCOUNTER — Ambulatory Visit (INDEPENDENT_AMBULATORY_CARE_PROVIDER_SITE_OTHER): Payer: PPO

## 2020-03-15 DIAGNOSIS — J309 Allergic rhinitis, unspecified: Secondary | ICD-10-CM | POA: Diagnosis not present

## 2020-03-31 ENCOUNTER — Ambulatory Visit (INDEPENDENT_AMBULATORY_CARE_PROVIDER_SITE_OTHER): Payer: PPO

## 2020-03-31 DIAGNOSIS — J309 Allergic rhinitis, unspecified: Secondary | ICD-10-CM | POA: Diagnosis not present

## 2020-04-07 ENCOUNTER — Ambulatory Visit (INDEPENDENT_AMBULATORY_CARE_PROVIDER_SITE_OTHER): Payer: PPO

## 2020-04-07 DIAGNOSIS — J309 Allergic rhinitis, unspecified: Secondary | ICD-10-CM

## 2020-04-12 DIAGNOSIS — Z1211 Encounter for screening for malignant neoplasm of colon: Secondary | ICD-10-CM | POA: Diagnosis not present

## 2020-04-12 DIAGNOSIS — I10 Essential (primary) hypertension: Secondary | ICD-10-CM | POA: Diagnosis not present

## 2020-04-12 DIAGNOSIS — J410 Simple chronic bronchitis: Secondary | ICD-10-CM | POA: Diagnosis not present

## 2020-04-12 DIAGNOSIS — F330 Major depressive disorder, recurrent, mild: Secondary | ICD-10-CM | POA: Diagnosis not present

## 2020-04-12 DIAGNOSIS — E785 Hyperlipidemia, unspecified: Secondary | ICD-10-CM | POA: Diagnosis not present

## 2020-04-12 DIAGNOSIS — Z0000 Encounter for general adult medical examination without abnormal findings: Secondary | ICD-10-CM | POA: Diagnosis not present

## 2020-04-12 DIAGNOSIS — K219 Gastro-esophageal reflux disease without esophagitis: Secondary | ICD-10-CM | POA: Diagnosis not present

## 2020-04-12 DIAGNOSIS — R7303 Prediabetes: Secondary | ICD-10-CM | POA: Diagnosis not present

## 2020-04-12 DIAGNOSIS — G4733 Obstructive sleep apnea (adult) (pediatric): Secondary | ICD-10-CM | POA: Diagnosis not present

## 2020-04-14 ENCOUNTER — Ambulatory Visit (INDEPENDENT_AMBULATORY_CARE_PROVIDER_SITE_OTHER): Payer: PPO

## 2020-04-14 DIAGNOSIS — J309 Allergic rhinitis, unspecified: Secondary | ICD-10-CM | POA: Diagnosis not present

## 2020-04-21 ENCOUNTER — Ambulatory Visit (INDEPENDENT_AMBULATORY_CARE_PROVIDER_SITE_OTHER): Payer: PPO

## 2020-04-21 DIAGNOSIS — J309 Allergic rhinitis, unspecified: Secondary | ICD-10-CM | POA: Diagnosis not present

## 2020-04-25 DIAGNOSIS — H538 Other visual disturbances: Secondary | ICD-10-CM | POA: Diagnosis not present

## 2020-04-25 DIAGNOSIS — H2512 Age-related nuclear cataract, left eye: Secondary | ICD-10-CM | POA: Diagnosis not present

## 2020-04-25 DIAGNOSIS — H2511 Age-related nuclear cataract, right eye: Secondary | ICD-10-CM | POA: Diagnosis not present

## 2020-04-29 ENCOUNTER — Ambulatory Visit (INDEPENDENT_AMBULATORY_CARE_PROVIDER_SITE_OTHER): Payer: PPO

## 2020-04-29 DIAGNOSIS — J309 Allergic rhinitis, unspecified: Secondary | ICD-10-CM

## 2020-05-11 ENCOUNTER — Ambulatory Visit (INDEPENDENT_AMBULATORY_CARE_PROVIDER_SITE_OTHER): Payer: PPO

## 2020-05-11 DIAGNOSIS — J309 Allergic rhinitis, unspecified: Secondary | ICD-10-CM

## 2020-05-24 ENCOUNTER — Ambulatory Visit (INDEPENDENT_AMBULATORY_CARE_PROVIDER_SITE_OTHER): Payer: PPO

## 2020-05-24 ENCOUNTER — Encounter (INDEPENDENT_AMBULATORY_CARE_PROVIDER_SITE_OTHER): Payer: Self-pay

## 2020-05-24 DIAGNOSIS — J309 Allergic rhinitis, unspecified: Secondary | ICD-10-CM | POA: Diagnosis not present

## 2020-06-02 DIAGNOSIS — M25562 Pain in left knee: Secondary | ICD-10-CM | POA: Insufficient documentation

## 2020-06-06 ENCOUNTER — Telehealth: Payer: Self-pay | Admitting: Allergy and Immunology

## 2020-06-06 ENCOUNTER — Other Ambulatory Visit: Payer: Self-pay | Admitting: Allergy and Immunology

## 2020-06-06 MED ORDER — SPIRIVA RESPIMAT 1.25 MCG/ACT IN AERS: INHALATION_SPRAY | RESPIRATORY_TRACT | 1 refills | Status: DC

## 2020-06-06 MED ORDER — AZELASTINE HCL 0.1 % NA SOLN: 1.0000 | Freq: Two times a day (BID) | NASAL | 1 refills | Status: DC

## 2020-06-06 MED ORDER — FLUTICASONE PROPIONATE 50 MCG/ACT NA SUSP: 2.0000 | Freq: Every day | NASAL | 1 refills | Status: DC

## 2020-06-06 NOTE — Telephone Encounter (Signed)
Patient called for refills. I informed her she needed and appointment. Last seen 03/24/19. She made an appointment for 08/02/20, with Dr. Neldon Mc. That was the first appointment he had. Patient did not want to see a Nurse Practitioner. She is requesting refills for azelastine, flonase, spiriva respimat, and prilosec. Pharmacy is [facility] on [address].

## 2020-06-06 NOTE — Telephone Encounter (Signed)
Refills sent to suffice until patient is seen in clinic. No further refills until seen in office.

## 2020-06-09 ENCOUNTER — Ambulatory Visit (INDEPENDENT_AMBULATORY_CARE_PROVIDER_SITE_OTHER): Payer: PPO

## 2020-06-09 DIAGNOSIS — J309 Allergic rhinitis, unspecified: Secondary | ICD-10-CM | POA: Diagnosis not present

## 2020-06-13 DIAGNOSIS — H04129 Dry eye syndrome of unspecified lacrimal gland: Secondary | ICD-10-CM | POA: Diagnosis not present

## 2020-06-13 DIAGNOSIS — H02889 Meibomian gland dysfunction of unspecified eye, unspecified eyelid: Secondary | ICD-10-CM | POA: Diagnosis not present

## 2020-06-23 DIAGNOSIS — Z20828 Contact with and (suspected) exposure to other viral communicable diseases: Secondary | ICD-10-CM | POA: Diagnosis not present

## 2020-06-30 DIAGNOSIS — M25562 Pain in left knee: Secondary | ICD-10-CM | POA: Diagnosis not present

## 2020-07-05 ENCOUNTER — Other Ambulatory Visit: Payer: Self-pay | Admitting: Allergy and Immunology

## 2020-07-12 ENCOUNTER — Ambulatory Visit (INDEPENDENT_AMBULATORY_CARE_PROVIDER_SITE_OTHER): Payer: PPO

## 2020-07-12 DIAGNOSIS — J309 Allergic rhinitis, unspecified: Secondary | ICD-10-CM

## 2020-07-22 DIAGNOSIS — Z1231 Encounter for screening mammogram for malignant neoplasm of breast: Secondary | ICD-10-CM | POA: Diagnosis not present

## 2020-07-28 DIAGNOSIS — M25562 Pain in left knee: Secondary | ICD-10-CM | POA: Diagnosis not present

## 2020-08-02 ENCOUNTER — Ambulatory Visit: Payer: PPO | Admitting: Allergy and Immunology

## 2020-08-02 DIAGNOSIS — H25043 Posterior subcapsular polar age-related cataract, bilateral: Secondary | ICD-10-CM | POA: Diagnosis not present

## 2020-08-02 DIAGNOSIS — H18413 Arcus senilis, bilateral: Secondary | ICD-10-CM | POA: Diagnosis not present

## 2020-08-02 DIAGNOSIS — H2513 Age-related nuclear cataract, bilateral: Secondary | ICD-10-CM | POA: Diagnosis not present

## 2020-08-02 DIAGNOSIS — H2511 Age-related nuclear cataract, right eye: Secondary | ICD-10-CM | POA: Diagnosis not present

## 2020-08-02 DIAGNOSIS — H04123 Dry eye syndrome of bilateral lacrimal glands: Secondary | ICD-10-CM | POA: Diagnosis not present

## 2020-08-02 DIAGNOSIS — H25013 Cortical age-related cataract, bilateral: Secondary | ICD-10-CM | POA: Diagnosis not present

## 2020-08-03 ENCOUNTER — Ambulatory Visit (INDEPENDENT_AMBULATORY_CARE_PROVIDER_SITE_OTHER): Payer: PPO

## 2020-08-03 DIAGNOSIS — Z822 Family history of deafness and hearing loss: Secondary | ICD-10-CM | POA: Diagnosis not present

## 2020-08-03 DIAGNOSIS — J309 Allergic rhinitis, unspecified: Secondary | ICD-10-CM | POA: Diagnosis not present

## 2020-08-03 DIAGNOSIS — H9313 Tinnitus, bilateral: Secondary | ICD-10-CM | POA: Diagnosis not present

## 2020-08-03 DIAGNOSIS — H90A22 Sensorineural hearing loss, unilateral, left ear, with restricted hearing on the contralateral side: Secondary | ICD-10-CM | POA: Diagnosis not present

## 2020-08-03 DIAGNOSIS — H90A31 Mixed conductive and sensorineural hearing loss, unilateral, right ear with restricted hearing on the contralateral side: Secondary | ICD-10-CM | POA: Diagnosis not present

## 2020-08-03 DIAGNOSIS — R42 Dizziness and giddiness: Secondary | ICD-10-CM | POA: Diagnosis not present

## 2020-08-10 DIAGNOSIS — J3081 Allergic rhinitis due to animal (cat) (dog) hair and dander: Secondary | ICD-10-CM | POA: Diagnosis not present

## 2020-08-10 NOTE — Progress Notes (Signed)
VIALS EXP 08-10-21 

## 2020-08-11 DIAGNOSIS — J3089 Other allergic rhinitis: Secondary | ICD-10-CM | POA: Diagnosis not present

## 2020-08-23 ENCOUNTER — Ambulatory Visit: Payer: PPO | Admitting: Allergy and Immunology

## 2020-08-23 ENCOUNTER — Encounter: Payer: Self-pay | Admitting: Allergy and Immunology

## 2020-08-23 ENCOUNTER — Other Ambulatory Visit: Payer: Self-pay

## 2020-08-23 VITALS — BP 160/70 | HR 83 | Temp 98.3°F | Resp 18 | Ht 66.5 in | Wt 155.4 lb

## 2020-08-23 DIAGNOSIS — J4540 Moderate persistent asthma, uncomplicated: Secondary | ICD-10-CM

## 2020-08-23 DIAGNOSIS — Z23 Encounter for immunization: Secondary | ICD-10-CM | POA: Diagnosis not present

## 2020-08-23 DIAGNOSIS — J3089 Other allergic rhinitis: Secondary | ICD-10-CM

## 2020-08-23 DIAGNOSIS — K219 Gastro-esophageal reflux disease without esophagitis: Secondary | ICD-10-CM

## 2020-08-23 DIAGNOSIS — J324 Chronic pansinusitis: Secondary | ICD-10-CM | POA: Diagnosis not present

## 2020-08-23 MED ORDER — FLUTICASONE PROPIONATE 50 MCG/ACT NA SUSP: NASAL | 5 refills | Status: DC

## 2020-08-23 MED ORDER — OMEPRAZOLE 40 MG PO CPDR: 40.0000 mg | DELAYED_RELEASE_CAPSULE | Freq: Every day | ORAL | 5 refills | Status: DC

## 2020-08-23 MED ORDER — PROAIR HFA 108 (90 BASE) MCG/ACT IN AERS: 2.0000 | INHALATION_SPRAY | RESPIRATORY_TRACT | 1 refills | Status: DC | PRN

## 2020-08-23 MED ORDER — LEVOCETIRIZINE DIHYDROCHLORIDE 5 MG PO TABS: ORAL_TABLET | ORAL | 5 refills | Status: DC

## 2020-08-23 MED ORDER — BUDESONIDE-FORMOTEROL FUMARATE 80-4.5 MCG/ACT IN AERO
INHALATION_SPRAY | RESPIRATORY_TRACT | 5 refills | Status: DC
Start: 2020-08-23 — End: 2020-11-09

## 2020-08-23 MED ORDER — EPINEPHRINE 0.3 MG/0.3ML IJ SOAJ: 0.3000 mg | Freq: Once | INTRAMUSCULAR | 1 refills | Status: AC

## 2020-08-23 MED ORDER — AZELASTINE HCL 0.1 % NA SOLN: NASAL | 5 refills | Status: DC

## 2020-08-23 NOTE — Progress Notes (Signed)
[place]   Follow-up Note  Referring Provider: Maurice Small, MD Primary Provider: Maurice Small, MD Date of Office Visit: 08/23/2020  Subjective:   Joanne Sanchez (DOB: 1956-04-16) is a 64 y.o. female who returns to the [facility] on 08/23/2020 in re-evaluation of the following:  HPI: Joanne Sanchez returns to this clinic in evaluation of asthma and allergic rhinitis and history of chronic sinusitis and reflux.  Her last contact with this clinic was via a E-med visit on 24 Mar 2019.  She has continued to do very well regarding her airway.  She has not required a systemic steroid or antibiotic for any type of airway issue.  Rarely does she use a short acting bronchodilator and she can exercise without any problem.  She has had no issues with her nose.  She has lost approximately 30 pounds of weight voluntarily and feels great as a result of this change.  She is receiving immunotherapy currently at every 2 to 3 weeks without any adverse effect.  Her reflux and her throat issue is under excellent control on her current medical plan.  She has been diagnosed with dry eye syndrome by her ophthalmologist.  She is received 2 Pfizer vaccinations.  Allergies as of 08/23/2020      Reactions   Iodinated Diagnostic Agents Hives, Swelling   Prior reaction to ivp contrast Patient pre-medicated w/ 13 hour prep prednisone and benadryl   Lipitor [atorvastatin] Other (See Comments)   MUSCLE CRAMPS   Metformin And Related Diarrhea      Medication List    amLODipine 5 MG tablet Commonly known as: NORVASC Take 5 mg by mouth daily.   azelastine 0.1 % nasal spray Commonly known as: ASTELIN Place 1 spray into both nostrils 2 (two) times daily. Use in each nostril as directed   budesonide-formoterol 80-4.5 MCG/ACT inhaler Commonly known as: Symbicort Inhale 2 puffs into the lungs 2 (two) times daily.   famotidine 20 MG tablet Commonly  known as: Pepcid Take 1 tablet (20 mg total) by mouth daily.   fluticasone 50 MCG/ACT nasal spray Commonly known as: FLONASE Place 2 sprays into both nostrils daily.   levocetirizine 5 MG tablet Commonly known as: XYZAL TAKE 1 TABLET(5 MG) BY MOUTH EVERY EVENING   MAGNESIUM CHLORIDE-CALCIUM PO Take by mouth daily. Reported on 05/04/2016   omeprazole 40 MG capsule Commonly known as: PRILOSEC Take 1 capsule (40 mg total) by mouth daily.   ProAir HFA 108 (90 Base) MCG/ACT inhaler Generic drug: albuterol Inhale 2 puffs into the lungs every 4 (four) hours as needed for wheezing or shortness of breath.   Spiriva Respimat 1.25 MCG/ACT Aers Generic drug: Tiotropium Bromide Monohydrate INHALE 2 PUFFS INTO THE LUNGS DAILY   Vitamin D-3 125 MCG (5000 UT) Tabs Take 5,000 Units by mouth daily.       Past Medical History:  Diagnosis Date  . Allergy   . Anemia   . Asthma   . Environmental allergies   . GERD (gastroesophageal reflux disease)   . Hyperlipidemia   . Hypertension   . Pre-diabetes   . Recurrent upper respiratory infection (URI)   . Vertigo     Past Surgical History:  Procedure Laterality Date  . ABDOMINAL HYSTERECTOMY  2000   TAH/BSO  . WISDOM TOOTH EXTRACTION  1990's    Review of systems negative except as noted in HPI / PMHx or noted below:  Review of Systems  Constitutional: Negative.   HENT: Negative.   Eyes: Negative.   Respiratory: Negative.   Cardiovascular: Negative.   Gastrointestinal: Negative.   Genitourinary: Negative.   Musculoskeletal: Negative.   Skin: Negative.   Neurological: Negative.   Endo/Heme/Allergies: Negative.   Psychiatric/Behavioral: Negative.      Objective:   Vitals:   08/23/20 1531  BP: (!) 160/70  Pulse: 83  Resp: 18  Temp: 98.3 F (36.8 C)  SpO2: 95%   Height: 5' 6.5" (168.9 cm)  Weight: 155 lb 6.4 oz (70.5 kg)   Physical Exam Constitutional:      Appearance: She is not diaphoretic.  HENT:     Head:  Normocephalic.     Right Ear: Tympanic membrane, ear canal and external ear normal.     Left Ear: Tympanic membrane, ear canal and external ear normal.     Nose: Nose normal. No mucosal edema or rhinorrhea.     Mouth/Throat:     Pharynx: Uvula midline. No oropharyngeal exudate.  Eyes:     Conjunctiva/sclera: Conjunctivae normal.  Neck:     Thyroid: No thyromegaly.     Trachea: Trachea normal. No tracheal tenderness or tracheal deviation.  Cardiovascular:     Rate and Rhythm: Normal rate and regular rhythm.     Heart sounds: Normal heart sounds, S1 normal and S2 normal. No murmur heard.   Pulmonary:     Effort: No respiratory distress.     Breath sounds: Normal breath sounds. No stridor. No wheezing or rales.  Lymphadenopathy:     Head:     Right side of head: No tonsillar adenopathy.     Left side of head: No tonsillar adenopathy.     Cervical: No cervical adenopathy.  Skin:    Findings: No erythema or rash.     Nails: There is no clubbing.  Neurological:     Mental Status: She is alert.     Diagnostics:    Spirometry was performed and demonstrated an FEV1 of 2.30 at 86 % of predicted.  The patient had an Asthma Control Test with the following results: ACT Total Score: 25.    Assessment and Plan:   1. Asthma, moderate persistent, well-controlled   2. Perennial allergic rhinitis   3. Chronic pansinusitis   4. LPRD (laryngopharyngeal reflux disease)   5. Need for immunization against influenza     1.  Continue to Treat and prevent inflammation:   A.  DECREASE Flonase 1 sprays each nostril 1- 2 times per day  B.  DECREASE Azelastine 1 spray each nostril 1 - 2 times per day  C.  DECREASE Symbicort 80 -2 inhalations 1-2 times per day  D.  Attempt to DISCONTINUE Spiriva 1.25 Respimat    3. Continue to treat reflux / LPR:   A. Omeprazole 40 mg in AM  B. Attempt to DISCONTINUE Famotidine 40 mg    4. If needed:   A. LevoCetirizine 5mg  one tablet 1-2 times per day  (Dry Eye???)   B. Proair HFA or similar 2 inhalations every 4-6 hours if needed  C. Eye drops  D. Nasal saline  5. Continue immunotherapy (Auvi-Q)  6. Return to clinic in 6 months or earlier if problem  7. Flu vaccine delivered in clinic today  8. Obtain Covid booster  [name] is really doing very well and I think there is an opportunity to have her consolidate some of her medical treatment.  She has a good understanding of her disease state and how  her medications work and appropriate dosing of her medications depending on disease activity.  We will see if we can decrease her anti-inflammatory medications for airway as noted above and also attempt to address some of her medication use for her reflux/LPR.  Assuming she does well on this plan I will see her back in this clinic in 6 months or earlier if there is a problem.  Allena Katz, MD Allergy / Immunology [place]

## 2020-08-23 NOTE — Patient Instructions (Addendum)
  1.  Continue to Treat and prevent inflammation:   A.  DECREASE Flonase 1 sprays each nostril 1- 2 times per day  B.  DECREASE Azelastine 1 spray each nostril 1 - 2 times per day  C.  DECREASE Symbicort 80 -2 inhalations 1-2 times per day  D.  Attempt to DISCONTINUE Spiriva 1.25 Respimat    3. Continue to treat reflux / LPR:   A. Omeprazole 40 mg in AM  B. Attempt to DISCONTINUE Famotidine 40 mg    4. If needed:   A. LevoCetirizine 5mg  one tablet 1-2 times per day (Dry Eye???)   B. Proair HFA or similar 2 inhalations every 4-6 hours if needed  C. Eye drops  D. Nasal saline  5. Continue immunotherapy (Auvi-Q)  6. Return to clinic in 6 months or earlier if problem  7. Flu vaccine delivered in clinic today  8. Obtain Covid booster

## 2020-08-24 ENCOUNTER — Encounter: Payer: Self-pay | Admitting: Allergy and Immunology

## 2020-08-26 ENCOUNTER — Ambulatory Visit (INDEPENDENT_AMBULATORY_CARE_PROVIDER_SITE_OTHER): Payer: PPO | Admitting: *Deleted

## 2020-08-26 DIAGNOSIS — J309 Allergic rhinitis, unspecified: Secondary | ICD-10-CM

## 2020-09-26 DIAGNOSIS — H2511 Age-related nuclear cataract, right eye: Secondary | ICD-10-CM | POA: Diagnosis not present

## 2020-09-27 DIAGNOSIS — H2512 Age-related nuclear cataract, left eye: Secondary | ICD-10-CM | POA: Diagnosis not present

## 2020-10-03 ENCOUNTER — Telehealth: Payer: Self-pay | Admitting: Allergy and Immunology

## 2020-10-03 NOTE — Telephone Encounter (Signed)
Patient was advise to check [employer] savings program to see if she qualifies for any savings on her Epipen. The one patient was wanting to get was Auvi-q which her insurance will not cover for.

## 2020-10-03 NOTE — Telephone Encounter (Signed)
Patient was last seen 08-23-20. She went to get her Epi Pen refilled and it is $75. When she was last seen, she was told there might be a coupon to reduce the cost. She is calling to see if we have anything that might help with the cost of this.

## 2020-10-05 ENCOUNTER — Ambulatory Visit (INDEPENDENT_AMBULATORY_CARE_PROVIDER_SITE_OTHER): Payer: PPO | Admitting: *Deleted

## 2020-10-05 DIAGNOSIS — J309 Allergic rhinitis, unspecified: Secondary | ICD-10-CM | POA: Diagnosis not present

## 2020-10-11 DIAGNOSIS — L821 Other seborrheic keratosis: Secondary | ICD-10-CM | POA: Diagnosis not present

## 2020-10-11 DIAGNOSIS — Z1283 Encounter for screening for malignant neoplasm of skin: Secondary | ICD-10-CM | POA: Diagnosis not present

## 2020-10-11 DIAGNOSIS — D2372 Other benign neoplasm of skin of left lower limb, including hip: Secondary | ICD-10-CM | POA: Diagnosis not present

## 2020-10-17 DIAGNOSIS — H2512 Age-related nuclear cataract, left eye: Secondary | ICD-10-CM | POA: Diagnosis not present

## 2020-11-03 DIAGNOSIS — H9041 Sensorineural hearing loss, unilateral, right ear, with unrestricted hearing on the contralateral side: Secondary | ICD-10-CM | POA: Diagnosis not present

## 2020-11-09 ENCOUNTER — Other Ambulatory Visit: Payer: Self-pay | Admitting: Otolaryngology

## 2020-11-09 ENCOUNTER — Other Ambulatory Visit: Payer: Self-pay | Admitting: *Deleted

## 2020-11-09 ENCOUNTER — Telehealth: Payer: Self-pay | Admitting: Allergy and Immunology

## 2020-11-09 DIAGNOSIS — H918X9 Other specified hearing loss, unspecified ear: Secondary | ICD-10-CM

## 2020-11-09 MED ORDER — BUDESONIDE-FORMOTEROL FUMARATE 80-4.5 MCG/ACT IN AERO: INHALATION_SPRAY | RESPIRATORY_TRACT | 1 refills | Status: DC

## 2020-11-09 MED ORDER — FLUTICASONE PROPIONATE 50 MCG/ACT NA SUSP: NASAL | 1 refills | Status: DC

## 2020-11-09 NOTE — Telephone Encounter (Signed)
Patient called back and was advised.  

## 2020-11-09 NOTE — Telephone Encounter (Signed)
Patient called and is requesting fluticasone and budesonide by sent in for a 90 day prescription. She said it will be cheaper with her insurance. She said at her last visit, azelastine was sent in for 90 days and she was able to get that, but not the other 2.

## 2020-11-09 NOTE — Telephone Encounter (Signed)
Refills have been sent in. Called and left a voicemail asking for patient to return call to inform.

## 2020-11-10 ENCOUNTER — Ambulatory Visit (INDEPENDENT_AMBULATORY_CARE_PROVIDER_SITE_OTHER): Payer: PPO

## 2020-11-10 DIAGNOSIS — J309 Allergic rhinitis, unspecified: Secondary | ICD-10-CM

## 2020-11-17 ENCOUNTER — Ambulatory Visit (INDEPENDENT_AMBULATORY_CARE_PROVIDER_SITE_OTHER): Payer: PPO | Admitting: *Deleted

## 2020-11-17 DIAGNOSIS — J309 Allergic rhinitis, unspecified: Secondary | ICD-10-CM

## 2020-11-24 ENCOUNTER — Ambulatory Visit (INDEPENDENT_AMBULATORY_CARE_PROVIDER_SITE_OTHER): Payer: PPO

## 2020-11-24 DIAGNOSIS — J309 Allergic rhinitis, unspecified: Secondary | ICD-10-CM

## 2020-11-30 DIAGNOSIS — H04129 Dry eye syndrome of unspecified lacrimal gland: Secondary | ICD-10-CM | POA: Diagnosis not present

## 2020-12-01 ENCOUNTER — Ambulatory Visit (INDEPENDENT_AMBULATORY_CARE_PROVIDER_SITE_OTHER): Payer: PPO | Admitting: *Deleted

## 2020-12-01 DIAGNOSIS — J309 Allergic rhinitis, unspecified: Secondary | ICD-10-CM

## 2020-12-02 ENCOUNTER — Other Ambulatory Visit: Payer: Self-pay

## 2020-12-02 ENCOUNTER — Ambulatory Visit
Admission: RE | Admit: 2020-12-02 | Discharge: 2020-12-02 | Disposition: A | Discharge status: Home / Self Care | Payer: PPO | Source: Ambulatory Visit | Attending: Otolaryngology | Admitting: Otolaryngology

## 2020-12-02 DIAGNOSIS — H918X9 Other specified hearing loss, unspecified ear: Secondary | ICD-10-CM

## 2020-12-02 DIAGNOSIS — J3489 Other specified disorders of nose and nasal sinuses: Secondary | ICD-10-CM | POA: Diagnosis not present

## 2020-12-02 DIAGNOSIS — Q283 Other malformations of cerebral vessels: Secondary | ICD-10-CM | POA: Diagnosis not present

## 2020-12-02 DIAGNOSIS — H9041 Sensorineural hearing loss, unilateral, right ear, with unrestricted hearing on the contralateral side: Secondary | ICD-10-CM | POA: Diagnosis not present

## 2020-12-02 IMAGING — MR MR HEAD WO/W CM
12 of 13 series · 44 of 48 positions shown · IV contrast (multihance)
Comparison: None.

CLINICAL DATA: Asymmetric right hearing loss

EXAM:
MRI HEAD WITHOUT AND WITH CONTRAST
TECHNIQUE: Multiplanar, multiecho pulse sequences of the brain and surrounding
structures were obtained without and with intravenous contrast.
CONTRAST:  15mL MULTIHANCE GADOBENATE DIMEGLUMINE 529 MG/ML IV SOLN

[Series 5: T1 · sagittal · 4.0mm · 0.72mm/px · 2 of 26 slices shown (1 of 3)]
[im 1/26]
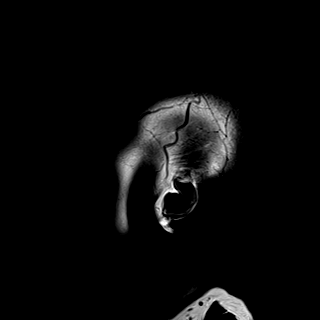
[im 26/26]
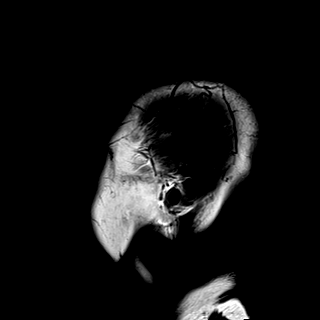

[Series 6: DWI · axial · 3.0mm · 0.94mm/px · z∈[-50,+89]mm · 10 of 160 slices shown]
[im 1/160]
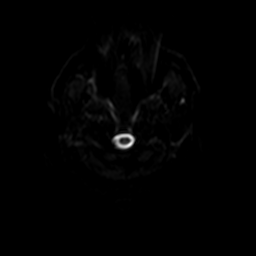
[im 18/160]
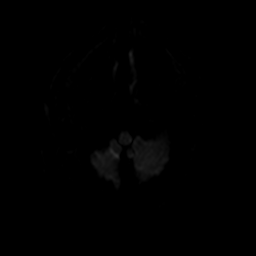
[im 36/160]
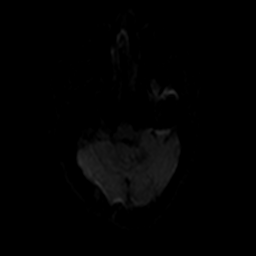
[im 54/160]
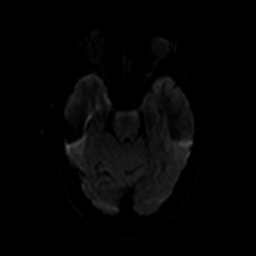
[im 71/160]
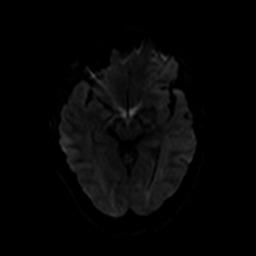
[im 89/160]
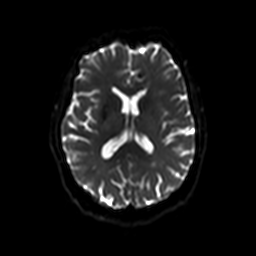
[im 107/160]
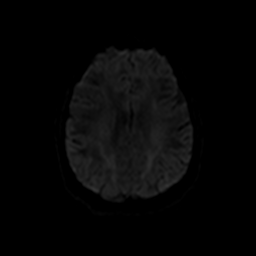
[im 124/160]
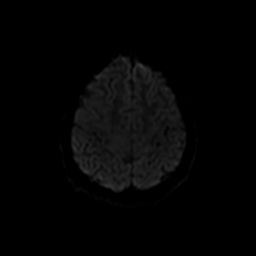
[im 142/160]
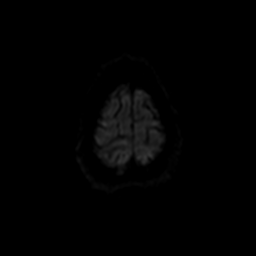
[im 160/160]
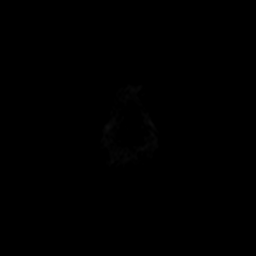

[Series 7: ax dwi_tracew · axial · 3.0mm · 0.94mm/px · z∈[-50,+89]mm · 5 of 80 slices shown]
[im 1/80]
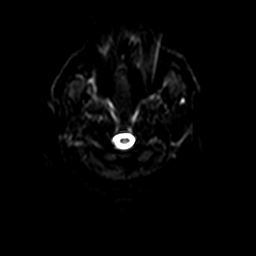
[im 20/80]
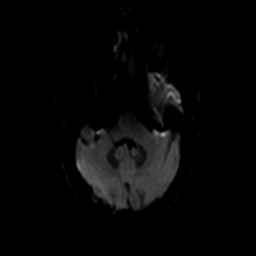
[im 40/80]
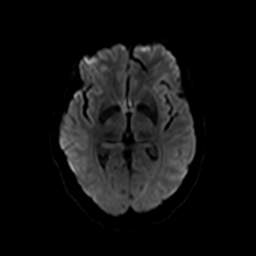
[im 60/80]
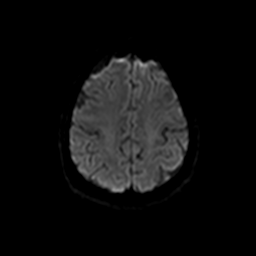
[im 80/80]
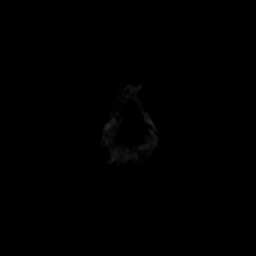

[Series 8: ax dwi_adc · axial · 3.0mm · 0.94mm/px · z∈[-50,+89]mm · 3 of 40 slices shown]
[im 1/40]
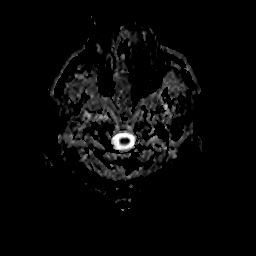
[im 20/40]
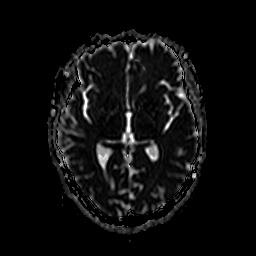
[im 40/40]
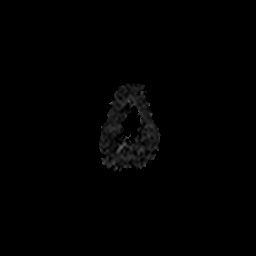

[Series 9: T2 · axial · 4.0mm · 0.36mm/px · z∈[-37,+91]mm · 2 of 26 slices shown]
[im 1/26]
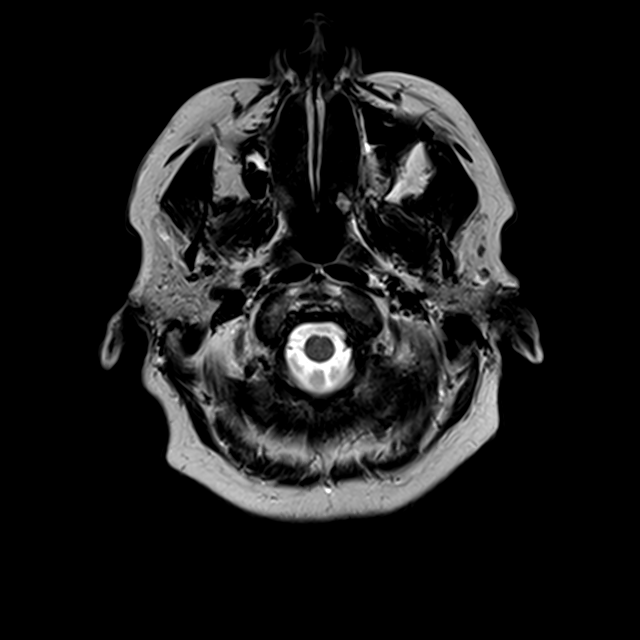
[im 26/26]
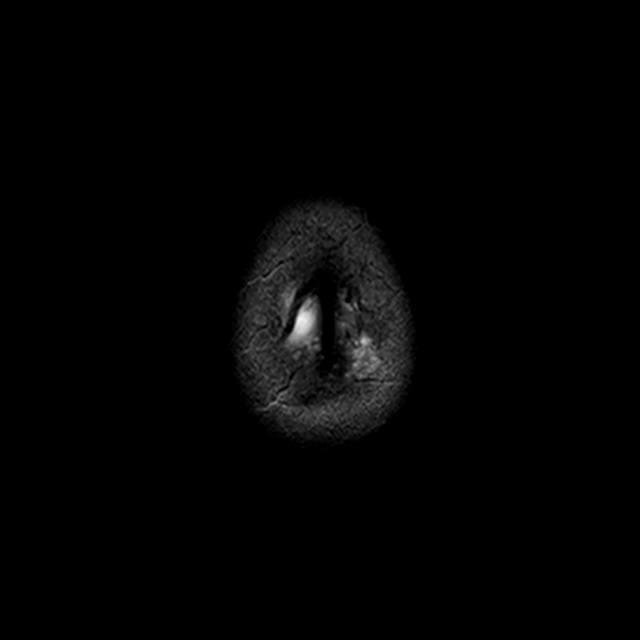

[Series 10: FLAIR · axial · 3.0mm · 0.72mm/px · z∈[-53,+107]mm · 2 of 28 slices shown]
[im 1/28]
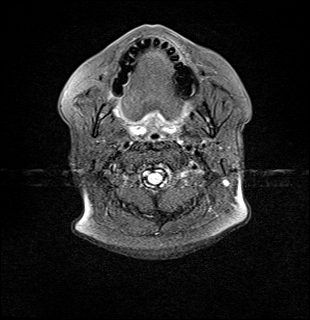
[im 28/28]
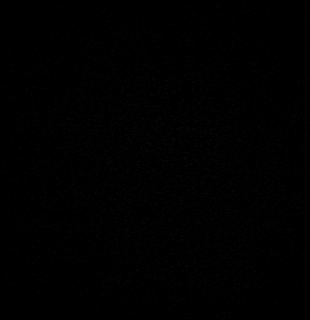

[Series 12: swi_images · axial · 1.5mm · 0.90mm/px · z∈[-44,+96]mm · 6 of 96 slices shown]
[im 1/96]
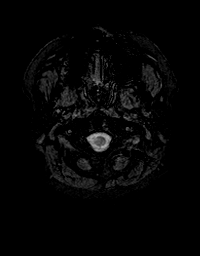
[im 20/96]
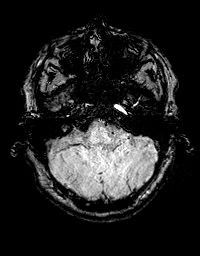
[im 39/96]
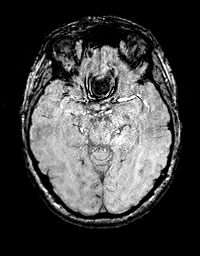
[im 58/96]
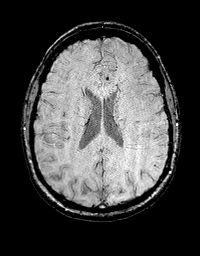
[im 77/96]
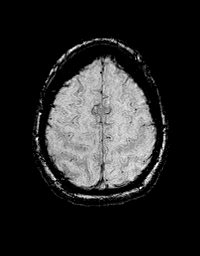
[im 96/96]
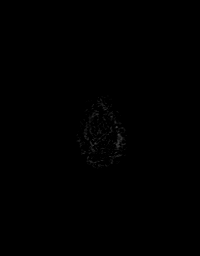

[Series 13: T1 · coronal · 2.5mm · 0.56mm/px · 1 of 13 slices shown (2 of 3)]
[im 1/13]
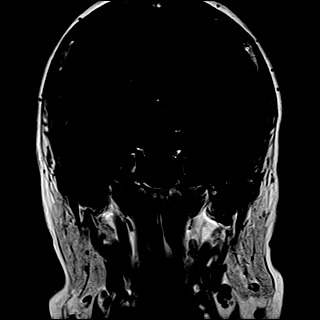

[Series 14: T1 · axial · 2.5mm · 0.50mm/px · 1 of 13 slices shown (3 of 3)]
[im 1/13]
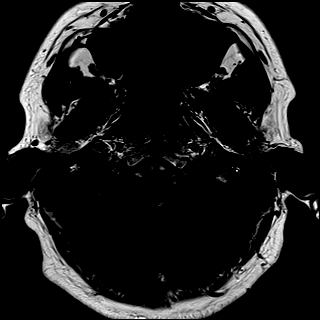

[Series 16: T1 post-contrast · coronal · 2.5mm · 0.56mm/px · 1 of 13 slices shown (1 of 3)]
[im 1/13]
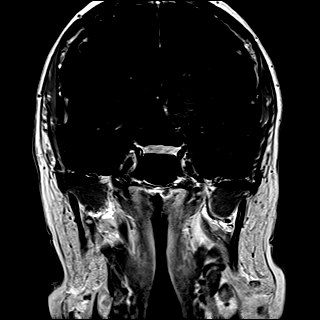

[Series 17: T1 post-contrast · axial · 2.5mm · 0.50mm/px · 1 of 13 slices shown (2 of 3)]
[im 1/13]
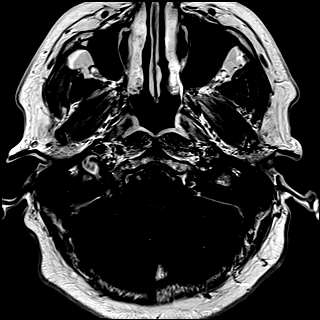

[Series 18: T1 post-contrast · axial · 1.0mm · 0.90mm/px · z∈[-52,+104]mm · 10 of 160 slices shown (3 of 3)]
[im 1/160]
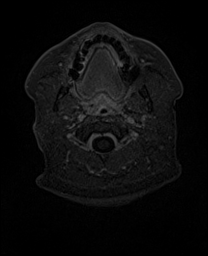
[im 18/160]
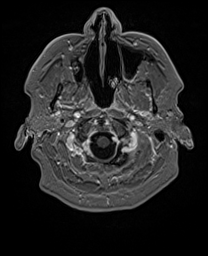
[im 36/160]
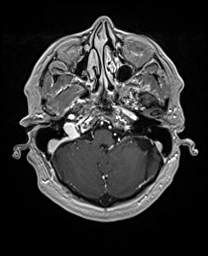
[im 54/160]
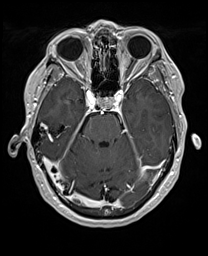
[im 71/160]
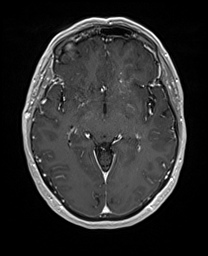
[im 89/160]
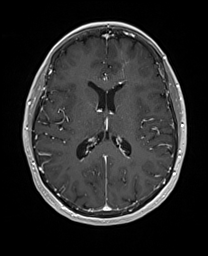
[im 107/160]
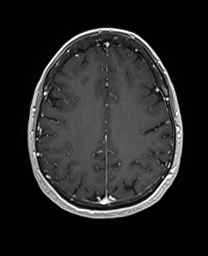
[im 124/160]
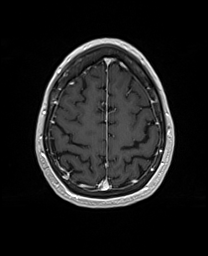
[im 142/160]
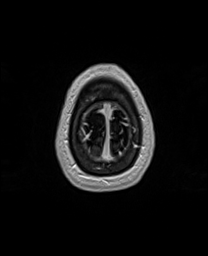
[im 160/160]
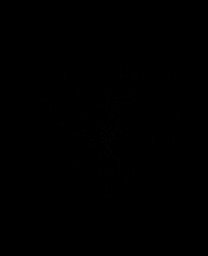

[44 of 48 positions shown; findings below may reference images not displayed]

FINDINGS: Brain: There is no cerebellopontine angle mass. Inner ear structures
demonstrate an unremarkable MR appearance. There is no abnormal
enhancement within the internal auditory canals.

Ventricles and sulci are within normal limits in size and
configuration. Left frontal developmental venous anomaly. Minimal
adjacent T2 hyperintensity along the collector vein may reflect
gliosis or edema from venous congestion. There are also two foci of
susceptibility hypointensity in this region, one of which
demonstrates mildly heterogeneous T2 signal. Few small foci of T2
hyperintensity in the supratentorial white matter are nonspecific
but may reflect minor chronic microvascular ischemic changes. No
acute infarction. There is no mass effect. There is no extra-axial
fluid collection.

Vascular: Major vessel flow voids at the skull base are preserved.

Skull and upper cervical spine: Normal marrow signal is preserved.

Sinuses/Orbits: Minor mucosal thickening. Bilateral lens
replacements.

Other: The sella is unremarkable.  Mastoid air cells are clear.
IMPRESSION: No evidence of vestibular schwannoma.

Incidental finding of left frontal developmental venous anomaly with
likely two small associated cavernous malformations. Minimal T2
hyperintensity adjacent to the collector vein may reflect gliosis or
edema from venous congestion.

## 2020-12-02 MED ORDER — GADOBENATE DIMEGLUMINE 529 MG/ML IV SOLN
15.0000 mL | Freq: Once | INTRAVENOUS | Status: AC | PRN
  Administered 2020-12-02: 15 mL via INTRAVENOUS

## 2020-12-09 ENCOUNTER — Ambulatory Visit (INDEPENDENT_AMBULATORY_CARE_PROVIDER_SITE_OTHER): Payer: PPO | Admitting: *Deleted

## 2020-12-09 DIAGNOSIS — J309 Allergic rhinitis, unspecified: Secondary | ICD-10-CM

## 2020-12-14 DIAGNOSIS — D1800 Hemangioma unspecified site: Secondary | ICD-10-CM | POA: Diagnosis not present

## 2020-12-20 DIAGNOSIS — H02889 Meibomian gland dysfunction of unspecified eye, unspecified eyelid: Secondary | ICD-10-CM | POA: Diagnosis not present

## 2020-12-20 DIAGNOSIS — H16223 Keratoconjunctivitis sicca, not specified as Sjogren's, bilateral: Secondary | ICD-10-CM | POA: Diagnosis not present

## 2020-12-20 DIAGNOSIS — Z961 Presence of intraocular lens: Secondary | ICD-10-CM | POA: Diagnosis not present

## 2020-12-20 DIAGNOSIS — H04129 Dry eye syndrome of unspecified lacrimal gland: Secondary | ICD-10-CM | POA: Diagnosis not present

## 2020-12-29 ENCOUNTER — Ambulatory Visit (INDEPENDENT_AMBULATORY_CARE_PROVIDER_SITE_OTHER): Payer: PPO

## 2020-12-29 DIAGNOSIS — J309 Allergic rhinitis, unspecified: Secondary | ICD-10-CM

## 2021-01-19 ENCOUNTER — Ambulatory Visit (INDEPENDENT_AMBULATORY_CARE_PROVIDER_SITE_OTHER): Payer: PPO | Admitting: *Deleted

## 2021-01-19 DIAGNOSIS — J309 Allergic rhinitis, unspecified: Secondary | ICD-10-CM

## 2021-01-19 DIAGNOSIS — R7303 Prediabetes: Secondary | ICD-10-CM | POA: Diagnosis not present

## 2021-01-19 DIAGNOSIS — E559 Vitamin D deficiency, unspecified: Secondary | ICD-10-CM | POA: Diagnosis not present

## 2021-01-19 DIAGNOSIS — I10 Essential (primary) hypertension: Secondary | ICD-10-CM | POA: Diagnosis not present

## 2021-01-19 DIAGNOSIS — E538 Deficiency of other specified B group vitamins: Secondary | ICD-10-CM | POA: Diagnosis not present

## 2021-01-19 DIAGNOSIS — E785 Hyperlipidemia, unspecified: Secondary | ICD-10-CM | POA: Diagnosis not present

## 2021-01-19 DIAGNOSIS — M3500 Sicca syndrome, unspecified: Secondary | ICD-10-CM | POA: Diagnosis not present

## 2021-01-25 DIAGNOSIS — I10 Essential (primary) hypertension: Secondary | ICD-10-CM | POA: Diagnosis not present

## 2021-01-25 DIAGNOSIS — H16221 Keratoconjunctivitis sicca, not specified as Sjogren's, right eye: Secondary | ICD-10-CM | POA: Diagnosis not present

## 2021-01-25 DIAGNOSIS — H16222 Keratoconjunctivitis sicca, not specified as Sjogren's, left eye: Secondary | ICD-10-CM | POA: Diagnosis not present

## 2021-01-25 DIAGNOSIS — H18413 Arcus senilis, bilateral: Secondary | ICD-10-CM | POA: Diagnosis not present

## 2021-01-25 DIAGNOSIS — H16223 Keratoconjunctivitis sicca, not specified as Sjogren's, bilateral: Secondary | ICD-10-CM | POA: Diagnosis not present

## 2021-01-25 DIAGNOSIS — Z961 Presence of intraocular lens: Secondary | ICD-10-CM | POA: Diagnosis not present

## 2021-02-08 ENCOUNTER — Ambulatory Visit (INDEPENDENT_AMBULATORY_CARE_PROVIDER_SITE_OTHER): Payer: PPO

## 2021-02-08 DIAGNOSIS — J309 Allergic rhinitis, unspecified: Secondary | ICD-10-CM

## 2021-03-01 ENCOUNTER — Ambulatory Visit (INDEPENDENT_AMBULATORY_CARE_PROVIDER_SITE_OTHER): Payer: PPO

## 2021-03-01 DIAGNOSIS — Z961 Presence of intraocular lens: Secondary | ICD-10-CM | POA: Diagnosis not present

## 2021-03-01 DIAGNOSIS — J309 Allergic rhinitis, unspecified: Secondary | ICD-10-CM

## 2021-03-01 DIAGNOSIS — H16223 Keratoconjunctivitis sicca, not specified as Sjogren's, bilateral: Secondary | ICD-10-CM | POA: Diagnosis not present

## 2021-03-01 DIAGNOSIS — H18413 Arcus senilis, bilateral: Secondary | ICD-10-CM | POA: Diagnosis not present

## 2021-03-01 DIAGNOSIS — H02831 Dermatochalasis of right upper eyelid: Secondary | ICD-10-CM | POA: Diagnosis not present

## 2021-03-07 DIAGNOSIS — J3081 Allergic rhinitis due to animal (cat) (dog) hair and dander: Secondary | ICD-10-CM | POA: Diagnosis not present

## 2021-03-07 NOTE — Progress Notes (Signed)
VIALS EXP 03-07-22 

## 2021-03-08 DIAGNOSIS — J3089 Other allergic rhinitis: Secondary | ICD-10-CM | POA: Diagnosis not present

## 2021-03-24 ENCOUNTER — Ambulatory Visit (INDEPENDENT_AMBULATORY_CARE_PROVIDER_SITE_OTHER): Payer: PPO

## 2021-03-24 DIAGNOSIS — J309 Allergic rhinitis, unspecified: Secondary | ICD-10-CM

## 2021-04-14 ENCOUNTER — Ambulatory Visit (INDEPENDENT_AMBULATORY_CARE_PROVIDER_SITE_OTHER): Payer: PPO | Admitting: *Deleted

## 2021-04-14 DIAGNOSIS — J309 Allergic rhinitis, unspecified: Secondary | ICD-10-CM | POA: Diagnosis not present

## 2021-05-03 ENCOUNTER — Ambulatory Visit (INDEPENDENT_AMBULATORY_CARE_PROVIDER_SITE_OTHER): Payer: PPO

## 2021-05-03 DIAGNOSIS — G4733 Obstructive sleep apnea (adult) (pediatric): Secondary | ICD-10-CM | POA: Diagnosis not present

## 2021-05-03 DIAGNOSIS — I10 Essential (primary) hypertension: Secondary | ICD-10-CM | POA: Diagnosis not present

## 2021-05-03 DIAGNOSIS — E559 Vitamin D deficiency, unspecified: Secondary | ICD-10-CM | POA: Diagnosis not present

## 2021-05-03 DIAGNOSIS — R7303 Prediabetes: Secondary | ICD-10-CM | POA: Diagnosis not present

## 2021-05-03 DIAGNOSIS — E785 Hyperlipidemia, unspecified: Secondary | ICD-10-CM | POA: Diagnosis not present

## 2021-05-03 DIAGNOSIS — Z0000 Encounter for general adult medical examination without abnormal findings: Secondary | ICD-10-CM | POA: Diagnosis not present

## 2021-05-03 DIAGNOSIS — Z23 Encounter for immunization: Secondary | ICD-10-CM | POA: Diagnosis not present

## 2021-05-03 DIAGNOSIS — J309 Allergic rhinitis, unspecified: Secondary | ICD-10-CM

## 2021-05-03 DIAGNOSIS — J410 Simple chronic bronchitis: Secondary | ICD-10-CM | POA: Diagnosis not present

## 2021-05-12 ENCOUNTER — Ambulatory Visit (INDEPENDENT_AMBULATORY_CARE_PROVIDER_SITE_OTHER): Payer: PPO

## 2021-05-12 DIAGNOSIS — J309 Allergic rhinitis, unspecified: Secondary | ICD-10-CM | POA: Diagnosis not present

## 2021-05-26 ENCOUNTER — Ambulatory Visit (INDEPENDENT_AMBULATORY_CARE_PROVIDER_SITE_OTHER): Payer: PPO

## 2021-05-26 DIAGNOSIS — J309 Allergic rhinitis, unspecified: Secondary | ICD-10-CM

## 2021-06-02 ENCOUNTER — Ambulatory Visit (INDEPENDENT_AMBULATORY_CARE_PROVIDER_SITE_OTHER): Payer: PPO

## 2021-06-02 DIAGNOSIS — J309 Allergic rhinitis, unspecified: Secondary | ICD-10-CM

## 2021-06-08 ENCOUNTER — Ambulatory Visit (INDEPENDENT_AMBULATORY_CARE_PROVIDER_SITE_OTHER): Payer: PPO

## 2021-06-08 DIAGNOSIS — J309 Allergic rhinitis, unspecified: Secondary | ICD-10-CM

## 2021-06-15 ENCOUNTER — Ambulatory Visit (INDEPENDENT_AMBULATORY_CARE_PROVIDER_SITE_OTHER): Payer: PPO | Admitting: *Deleted

## 2021-06-15 DIAGNOSIS — J309 Allergic rhinitis, unspecified: Secondary | ICD-10-CM

## 2021-07-14 ENCOUNTER — Ambulatory Visit (INDEPENDENT_AMBULATORY_CARE_PROVIDER_SITE_OTHER): Payer: PPO

## 2021-07-14 DIAGNOSIS — J309 Allergic rhinitis, unspecified: Secondary | ICD-10-CM | POA: Diagnosis not present

## 2021-08-09 ENCOUNTER — Ambulatory Visit (INDEPENDENT_AMBULATORY_CARE_PROVIDER_SITE_OTHER): Payer: PPO | Admitting: *Deleted

## 2021-08-09 DIAGNOSIS — J309 Allergic rhinitis, unspecified: Secondary | ICD-10-CM

## 2021-09-08 ENCOUNTER — Ambulatory Visit (INDEPENDENT_AMBULATORY_CARE_PROVIDER_SITE_OTHER): Payer: PPO

## 2021-09-08 DIAGNOSIS — J309 Allergic rhinitis, unspecified: Secondary | ICD-10-CM

## 2021-10-05 ENCOUNTER — Ambulatory Visit (INDEPENDENT_AMBULATORY_CARE_PROVIDER_SITE_OTHER): Payer: PPO | Admitting: *Deleted

## 2021-10-05 DIAGNOSIS — J309 Allergic rhinitis, unspecified: Secondary | ICD-10-CM

## 2021-10-12 DIAGNOSIS — J3081 Allergic rhinitis due to animal (cat) (dog) hair and dander: Secondary | ICD-10-CM | POA: Diagnosis not present

## 2021-10-13 DIAGNOSIS — J3089 Other allergic rhinitis: Secondary | ICD-10-CM | POA: Diagnosis not present

## 2021-10-16 NOTE — Progress Notes (Signed)
VIALS MADE. EXP 10-16-22

## 2021-10-24 DIAGNOSIS — D171 Benign lipomatous neoplasm of skin and subcutaneous tissue of trunk: Secondary | ICD-10-CM | POA: Diagnosis not present

## 2021-10-24 DIAGNOSIS — K921 Melena: Secondary | ICD-10-CM | POA: Diagnosis not present

## 2021-11-01 ENCOUNTER — Ambulatory Visit (INDEPENDENT_AMBULATORY_CARE_PROVIDER_SITE_OTHER): Payer: PPO

## 2021-11-01 DIAGNOSIS — J309 Allergic rhinitis, unspecified: Secondary | ICD-10-CM

## 2021-11-10 DIAGNOSIS — H0288A Meibomian gland dysfunction right eye, upper and lower eyelids: Secondary | ICD-10-CM | POA: Diagnosis not present

## 2021-11-10 DIAGNOSIS — H0102B Squamous blepharitis left eye, upper and lower eyelids: Secondary | ICD-10-CM | POA: Diagnosis not present

## 2021-11-10 DIAGNOSIS — H1131 Conjunctival hemorrhage, right eye: Secondary | ICD-10-CM | POA: Diagnosis not present

## 2021-11-10 DIAGNOSIS — H16223 Keratoconjunctivitis sicca, not specified as Sjogren's, bilateral: Secondary | ICD-10-CM | POA: Diagnosis not present

## 2021-11-10 DIAGNOSIS — H0102A Squamous blepharitis right eye, upper and lower eyelids: Secondary | ICD-10-CM | POA: Diagnosis not present

## 2021-11-10 DIAGNOSIS — H04123 Dry eye syndrome of bilateral lacrimal glands: Secondary | ICD-10-CM | POA: Diagnosis not present

## 2021-11-10 DIAGNOSIS — Z961 Presence of intraocular lens: Secondary | ICD-10-CM | POA: Diagnosis not present

## 2021-11-10 DIAGNOSIS — H0288B Meibomian gland dysfunction left eye, upper and lower eyelids: Secondary | ICD-10-CM | POA: Diagnosis not present

## 2021-11-27 ENCOUNTER — Other Ambulatory Visit: Payer: Self-pay | Admitting: Neurosurgery

## 2021-11-27 DIAGNOSIS — D1800 Hemangioma unspecified site: Secondary | ICD-10-CM

## 2021-12-04 ENCOUNTER — Ambulatory Visit (INDEPENDENT_AMBULATORY_CARE_PROVIDER_SITE_OTHER): Payer: PPO

## 2021-12-04 DIAGNOSIS — J309 Allergic rhinitis, unspecified: Secondary | ICD-10-CM | POA: Diagnosis not present

## 2021-12-11 ENCOUNTER — Ambulatory Visit (INDEPENDENT_AMBULATORY_CARE_PROVIDER_SITE_OTHER): Payer: PPO

## 2021-12-11 DIAGNOSIS — H0102B Squamous blepharitis left eye, upper and lower eyelids: Secondary | ICD-10-CM | POA: Diagnosis not present

## 2021-12-11 DIAGNOSIS — H16223 Keratoconjunctivitis sicca, not specified as Sjogren's, bilateral: Secondary | ICD-10-CM | POA: Diagnosis not present

## 2021-12-11 DIAGNOSIS — J309 Allergic rhinitis, unspecified: Secondary | ICD-10-CM

## 2021-12-11 DIAGNOSIS — H0102A Squamous blepharitis right eye, upper and lower eyelids: Secondary | ICD-10-CM | POA: Diagnosis not present

## 2021-12-11 DIAGNOSIS — Z961 Presence of intraocular lens: Secondary | ICD-10-CM | POA: Diagnosis not present

## 2021-12-11 DIAGNOSIS — H0288B Meibomian gland dysfunction left eye, upper and lower eyelids: Secondary | ICD-10-CM | POA: Diagnosis not present

## 2021-12-11 DIAGNOSIS — H0288A Meibomian gland dysfunction right eye, upper and lower eyelids: Secondary | ICD-10-CM | POA: Diagnosis not present

## 2021-12-12 ENCOUNTER — Ambulatory Visit
Admission: RE | Admit: 2021-12-12 | Discharge: 2021-12-12 | Disposition: A | Discharge status: Home / Self Care | Payer: PPO | Source: Ambulatory Visit | Attending: Neurosurgery | Admitting: Neurosurgery

## 2021-12-12 DIAGNOSIS — Q283 Other malformations of cerebral vessels: Secondary | ICD-10-CM | POA: Diagnosis not present

## 2021-12-12 DIAGNOSIS — J3489 Other specified disorders of nose and nasal sinuses: Secondary | ICD-10-CM | POA: Diagnosis not present

## 2021-12-12 DIAGNOSIS — D1800 Hemangioma unspecified site: Secondary | ICD-10-CM

## 2021-12-12 DIAGNOSIS — D1802 Hemangioma of intracranial structures: Secondary | ICD-10-CM | POA: Diagnosis not present

## 2021-12-12 IMAGING — MR MR HEAD W/O CM
10 series · 48 of 48 positions shown · non-contrast
Comparison: Brain MRI [DATE]

CLINICAL DATA: History of cavernoma, follow-up

EXAM:
MRI HEAD WITHOUT CONTRAST
TECHNIQUE: Multiplanar, multiecho pulse sequences of the brain and surrounding
structures were obtained without intravenous contrast.

[Series 2: T1 · sagittal · 5.0mm · 0.45mm/px · 3 of 24 slices shown (1 of 2)]
[im 1/24]
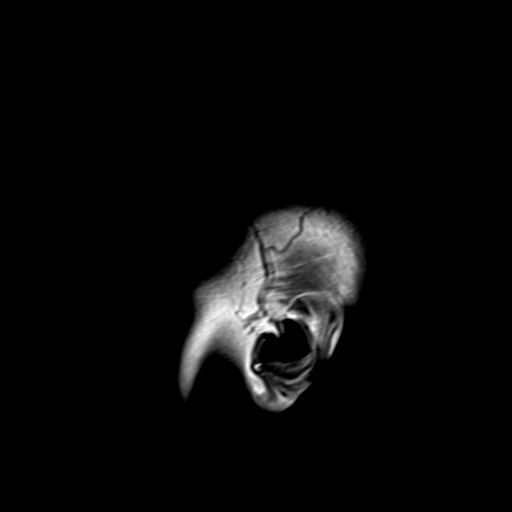
[im 12/24]
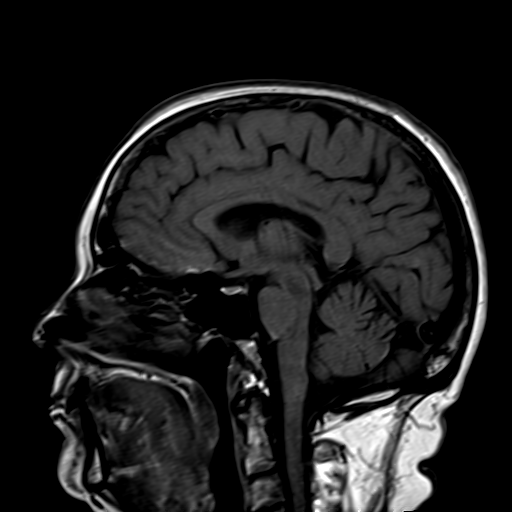
[im 24/24]
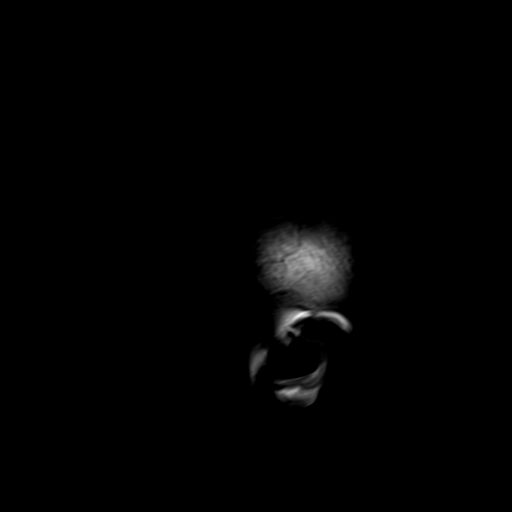

[Series 3: ax ep2d_diff_3 · axial · 3.0mm · 1.80mm/px · z∈[-42,+98]mm · 8 of 98 slices shown]
[im 1/98]
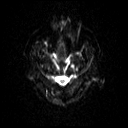
[im 14/98]
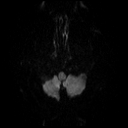
[im 28/98]
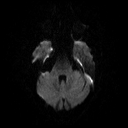
[im 42/98]
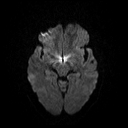
[im 56/98]
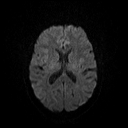
[im 70/98]
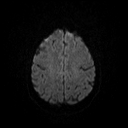
[im 84/98]
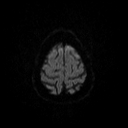
[im 98/98]
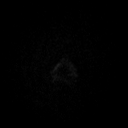

[Series 4: ax ep2d_diff_3_adc · axial · 3.0mm · 1.80mm/px · z∈[-42,+98]mm · 4 of 50 slices shown]
[im 1/50]
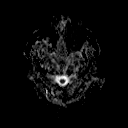
[im 17/50]
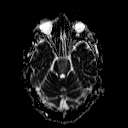
[im 33/50]
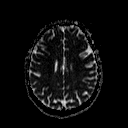
[im 50/50]
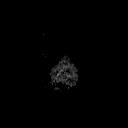

[Series 5: cor ep2d_diffusion · coronal · 5.0mm · 1.77mm/px · 5 of 54 slices shown]
[im 1/54]
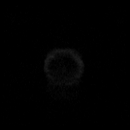
[im 14/54]
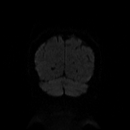
[im 27/54]
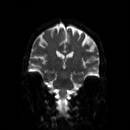
[im 40/54]
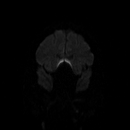
[im 54/54]
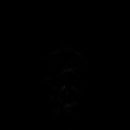

[Series 6: cor ep2d_diffusion_adc · coronal · 5.0mm · 1.77mm/px · 2 of 28 slices shown]
[im 1/28]
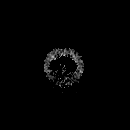
[im 28/28]
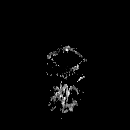

[Series 7: T2 · axial · 5.0mm · 0.51mm/px · z∈[-41,+97]mm · 2 of 25 slices shown (1 of 2)]
[im 1/25]
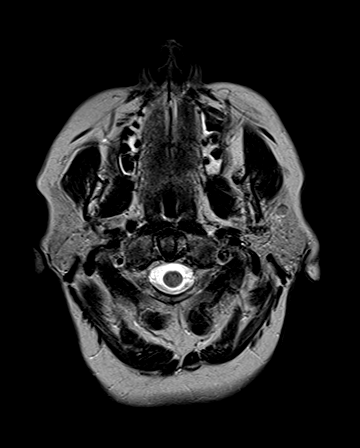
[im 25/25]
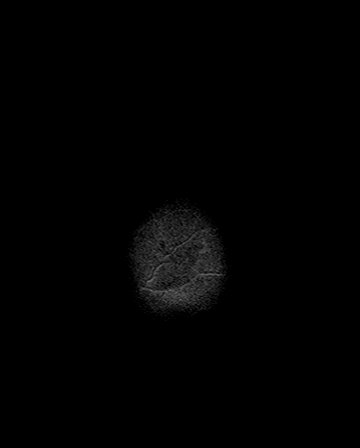

[Series 9: swi_images · axial · 3.0mm · 0.90mm/px · z∈[-45,+101]mm · 4 of 52 slices shown]
[im 1/52]
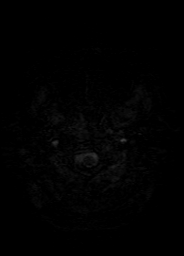
[im 18/52]
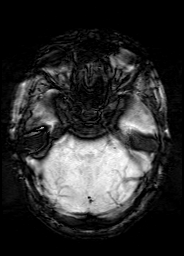
[im 35/52]
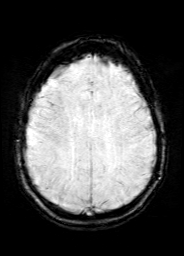
[im 52/52]
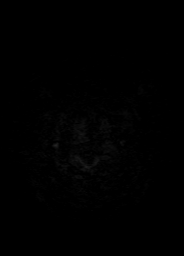

[Series 10: FLAIR · axial · 3.0mm · 0.43mm/px · z∈[-44,+96]mm · 3 of 38 slices shown]
[im 1/38]
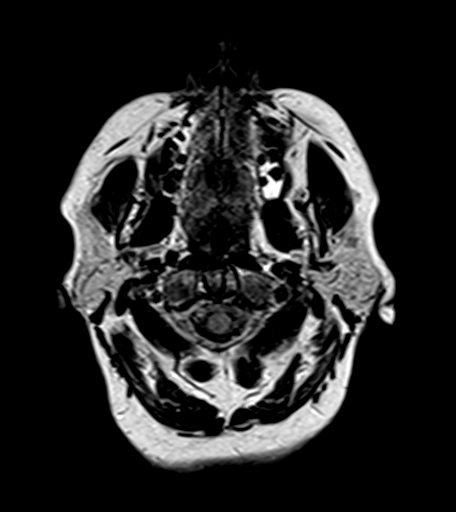
[im 19/38]
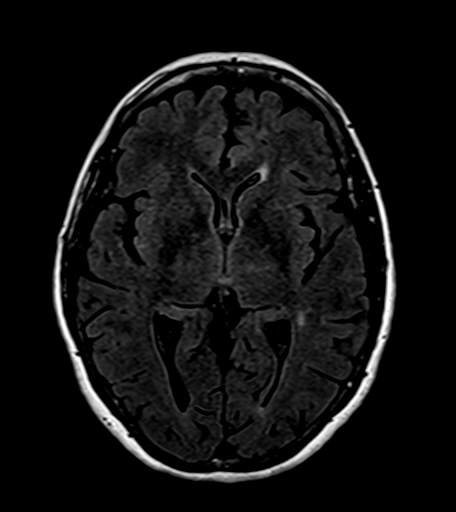
[im 38/38]
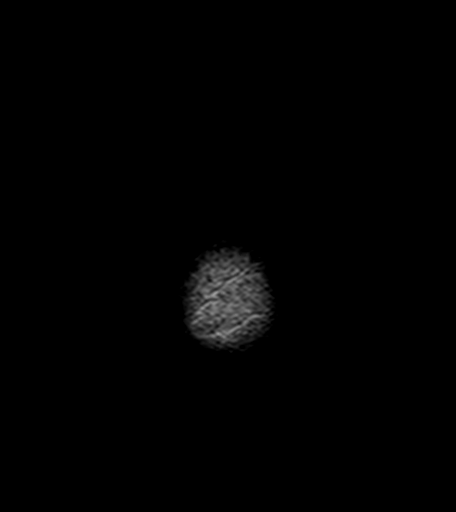

[Series 11: T1 · axial · 1.0mm · 0.72mm/px · z∈[-48,+104]mm · 14 of 160 slices shown (2 of 2)]
[im 1/160]
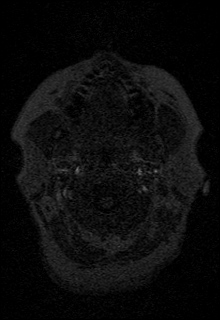
[im 13/160]
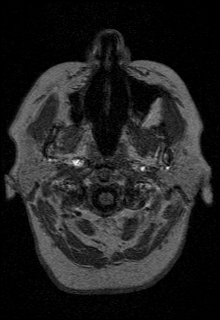
[im 25/160]
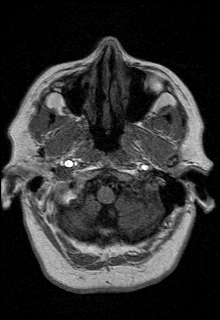
[im 37/160]
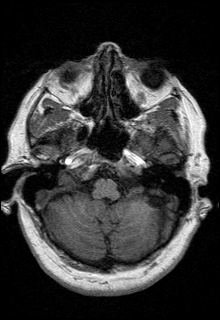
[im 49/160]
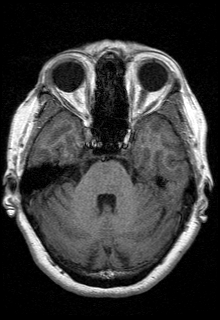
[im 62/160]
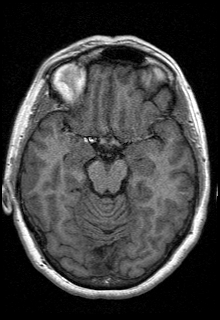
[im 74/160]
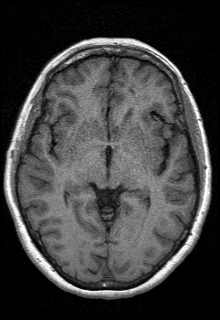
[im 86/160]
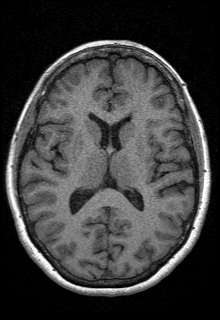
[im 98/160]
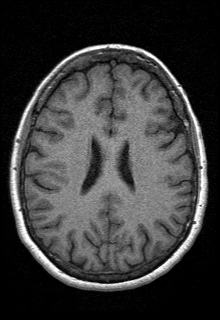
[im 111/160]
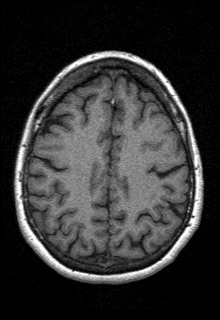
[im 123/160]
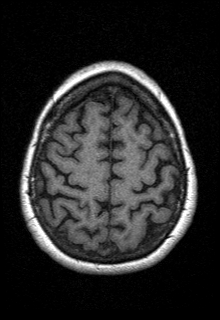
[im 135/160]
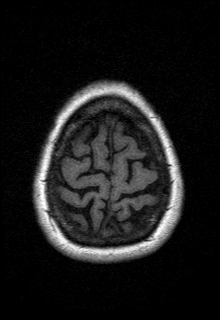
[im 147/160]
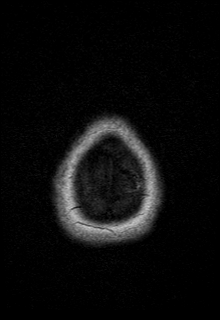
[im 160/160]
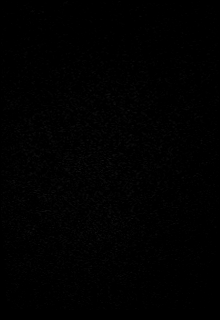

[Series 12: T2 · coronal · 5.0mm · 0.43mm/px · 3 of 30 slices shown (2 of 2)]
[im 1/30]
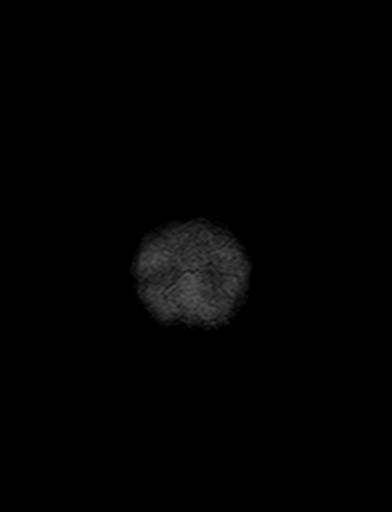
[im 15/30]
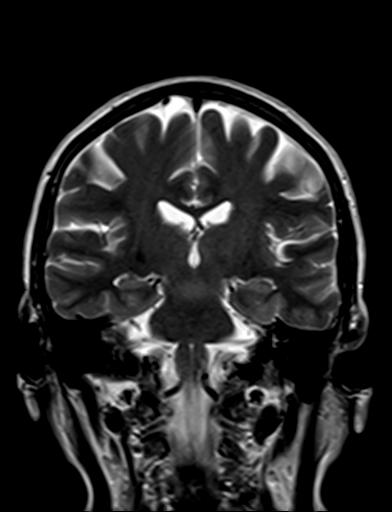
[im 30/30]
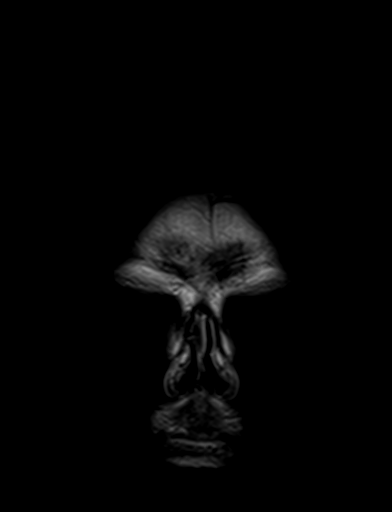

[48 of 48 positions shown; findings below may reference images not displayed]

FINDINGS: Brain: There is no evidence of acute intracranial hemorrhage,
extra-axial fluid collection, or acute infarct.

Curvilinear SWI signal dropout is again seen in the left anterior
frontal lobe consistent with a developmental venous anomaly with
minimal adjacent FLAIR signal abnormality. Additional rounded foci
of SWI signal dropout/blooming artifact in the adjacent frontal lobe
measuring 8 mm and 3 mm are unchanged, again likely reflecting
cavernomas.

Scattered foci of FLAIR signal abnormality in the subcortical and
periventricular white matter are again seen, nonspecific but likely
reflecting sequela of mild chronic white matter microangiopathy.

There is no new mass lesion.  There is no midline shift.

Vascular: Normal flow voids.

Skull and upper cervical spine: Normal marrow signal.

Sinuses/Orbits: There is mild mucosal thickening in the paranasal
sinuses. Bilateral lens implants are in place. The globes and orbits
are otherwise unremarkable.

Other: None.
IMPRESSION: Unchanged left frontal lobe developmental venous anomaly and two
small adjacent cavernomas.

## 2021-12-19 ENCOUNTER — Ambulatory Visit (INDEPENDENT_AMBULATORY_CARE_PROVIDER_SITE_OTHER): Payer: PPO | Admitting: *Deleted

## 2021-12-19 DIAGNOSIS — H04123 Dry eye syndrome of bilateral lacrimal glands: Secondary | ICD-10-CM | POA: Diagnosis not present

## 2021-12-19 DIAGNOSIS — J309 Allergic rhinitis, unspecified: Secondary | ICD-10-CM | POA: Diagnosis not present

## 2021-12-19 DIAGNOSIS — H04129 Dry eye syndrome of unspecified lacrimal gland: Secondary | ICD-10-CM | POA: Diagnosis not present

## 2021-12-20 DIAGNOSIS — D1800 Hemangioma unspecified site: Secondary | ICD-10-CM | POA: Diagnosis not present

## 2021-12-22 DIAGNOSIS — Z1231 Encounter for screening mammogram for malignant neoplasm of breast: Secondary | ICD-10-CM | POA: Diagnosis not present

## 2021-12-26 ENCOUNTER — Ambulatory Visit (INDEPENDENT_AMBULATORY_CARE_PROVIDER_SITE_OTHER): Payer: PPO

## 2021-12-26 DIAGNOSIS — J309 Allergic rhinitis, unspecified: Secondary | ICD-10-CM

## 2021-12-26 DIAGNOSIS — Z8719 Personal history of other diseases of the digestive system: Secondary | ICD-10-CM | POA: Diagnosis not present

## 2021-12-26 DIAGNOSIS — K625 Hemorrhage of anus and rectum: Secondary | ICD-10-CM | POA: Diagnosis not present

## 2021-12-26 DIAGNOSIS — K219 Gastro-esophageal reflux disease without esophagitis: Secondary | ICD-10-CM | POA: Diagnosis not present

## 2021-12-26 DIAGNOSIS — Z1211 Encounter for screening for malignant neoplasm of colon: Secondary | ICD-10-CM | POA: Diagnosis not present

## 2022-01-01 ENCOUNTER — Ambulatory Visit (INDEPENDENT_AMBULATORY_CARE_PROVIDER_SITE_OTHER): Payer: PPO

## 2022-01-01 DIAGNOSIS — J309 Allergic rhinitis, unspecified: Secondary | ICD-10-CM | POA: Diagnosis not present

## 2022-01-31 ENCOUNTER — Ambulatory Visit (INDEPENDENT_AMBULATORY_CARE_PROVIDER_SITE_OTHER): Payer: PPO

## 2022-01-31 DIAGNOSIS — J309 Allergic rhinitis, unspecified: Secondary | ICD-10-CM

## 2022-02-16 ENCOUNTER — Encounter: Payer: Self-pay | Admitting: Internal Medicine

## 2022-02-16 ENCOUNTER — Ambulatory Visit: Payer: PPO | Admitting: Internal Medicine

## 2022-02-16 VITALS — BP 147/61 | HR 63 | Resp 14 | Ht 65.25 in | Wt 154.0 lb

## 2022-02-16 DIAGNOSIS — H9190 Unspecified hearing loss, unspecified ear: Secondary | ICD-10-CM | POA: Insufficient documentation

## 2022-02-16 DIAGNOSIS — H04123 Dry eye syndrome of bilateral lacrimal glands: Secondary | ICD-10-CM | POA: Diagnosis not present

## 2022-02-16 DIAGNOSIS — I10 Essential (primary) hypertension: Secondary | ICD-10-CM | POA: Insufficient documentation

## 2022-02-16 DIAGNOSIS — H04129 Dry eye syndrome of unspecified lacrimal gland: Secondary | ICD-10-CM | POA: Insufficient documentation

## 2022-02-16 DIAGNOSIS — B999 Unspecified infectious disease: Secondary | ICD-10-CM | POA: Diagnosis not present

## 2022-02-16 DIAGNOSIS — R5383 Other fatigue: Secondary | ICD-10-CM

## 2022-02-16 DIAGNOSIS — M25562 Pain in left knee: Secondary | ICD-10-CM | POA: Diagnosis not present

## 2022-02-16 DIAGNOSIS — G8929 Other chronic pain: Secondary | ICD-10-CM | POA: Diagnosis not present

## 2022-02-16 DIAGNOSIS — H918X3 Other specified hearing loss, bilateral: Secondary | ICD-10-CM

## 2022-02-16 DIAGNOSIS — R21 Rash and other nonspecific skin eruption: Secondary | ICD-10-CM | POA: Insufficient documentation

## 2022-02-16 DIAGNOSIS — D1800 Hemangioma unspecified site: Secondary | ICD-10-CM | POA: Insufficient documentation

## 2022-02-16 DIAGNOSIS — J410 Simple chronic bronchitis: Secondary | ICD-10-CM | POA: Insufficient documentation

## 2022-02-16 DIAGNOSIS — E559 Vitamin D deficiency, unspecified: Secondary | ICD-10-CM | POA: Insufficient documentation

## 2022-02-16 NOTE — Progress Notes (Signed)
? ?Office Visit Note ? ?Patient: Joanne Sanchez             ?Date of Birth: 08-19-1956           ?MRN: [id number]             ?PCP: Pcp, No ?Referring: Jonathon Jordan, MD ?Visit Date: 02/16/2022 ? ? ?Subjective:  ?New Patient (Initial Visit) (Cataract surgery 10/17/2020, dry eye) ? ? ?History of Present Illness: Joanne Sanchez is a 66 y.o. female here for evaluation concerned for sjogren syndrome or inflammatory process with chronic dry eyes, intermittent skin rashes, and multiple joint pains. Previous labs negative SSA and SSB normal sedimentation rate.  She has been having many of the symptoms for at least the past 10 years some progressive worsening over time.  She has had chronic dry eyes with superficial irritation.  She worked on this with topical treatments to improve eye condition well enough to tolerate cataract surgery.  After this she is continued to experience similar amount of dry eye problem.  She never appreciates any swelling or inflammatory changes around the eyes.  She does not have any significant visual changes. She has a chronically dry mouth symptoms she drinks water and to use sugar-free gum at stimulates saliva production that improves this partially.  She also has asthma with recurrent respiratory infections and sinus problem complaints. ?She has joint pain in several areas does not notice significant swelling or erythema or abnormal range of motion.  She previously had considerable back pain that is currently resolved.  She has some numbness on her right lower face after dental surgery.  Does not have similar numbness symptoms in other locations. She reports suffering from sensorineural hearing loss unclear exact cause or mechanism but has impaired hearing for years. ?Skin changes has some normal tone or hyperpigmented raised skin bumps in multiple areas.  She says these were previously told they are nerve endings by dermatology in the past.  Does not describe any particular  photosensitivity or erythematous rashes.  ? ?Labs reviewed ?SSA neg ?SSB neg ?ESR 3 ?TSH 2.26 ? ?Activities of Daily Living:  ?Patient reports morning stiffness for 0  none .   ?Patient Denies nocturnal pain.  ?Difficulty dressing/grooming: Denies ?Difficulty climbing stairs: Denies ?Difficulty getting out of chair: Denies ?Difficulty using hands for taps, buttons, cutlery, and/or writing: Denies ? ?Review of Systems  ?Constitutional:  Negative for fatigue.  ?HENT:  Positive for mouth dryness.   ?Eyes:  Positive for dryness.  ?Respiratory:  Negative for shortness of breath.   ?Cardiovascular:  Negative for swelling in legs/feet.  ?Gastrointestinal:  Negative for constipation.  ?Endocrine: Positive for excessive thirst and increased urination.  ?Genitourinary:  Negative for difficulty urinating.  ?Musculoskeletal:  Positive for gait problem.  ?Skin:  Negative for rash.  ?Allergic/Immunologic: Negative for susceptible to infections.  ?Neurological:  Positive for numbness.  ?Hematological:  Negative for bruising/bleeding tendency.  ?Psychiatric/Behavioral:  Negative for sleep disturbance.   ? ?PMFS History:  ?Patient Active Problem List  ? Diagnosis Date Noted  ? 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 Hearing loss 02/16/2022  ? Simple chronic bronchitis ([place]) 02/16/2022  ? Chronically dry eyes, bilateral 02/16/2022  ? Tear film insufficiency 02/16/2022  ? Vitamin D deficiency 02/16/2022  ? Benign essential hypertension 02/16/2022  ? Rash and other nonspecific skin eruption 02/16/2022  ? Recurrent infections 02/16/2022  ? Pain in left knee 06/02/2020  ? Other fatigue 12/31/2018  ? Acute maxillary sinusitis 12/24/2018  ? Seasonal  and perennial allergic rhinoconjunctivitis 12/24/2018  ? Asthma, well controlled 09/30/2017  ? Upper airway cough syndrome 12/31/2016  ? Allergic rhinitis 12/31/2016  ? OSA (obstructive sleep apnea) 11/30/2016  ? Dyspnea 11/30/2016  ? Encounter for long-term (current) use of other medications  12/31/2013  ? Hypertension   ? Hyperlipidemia   ? GERD (gastroesophageal reflux disease)   ? Pre-diabetes   ?  ?Past Medical History:  ?Diagnosis Date  ? Allergy   ? Anemia   ? Asthma   ? Cavernous angioma   ? Environmental allergies   ? GERD (gastroesophageal reflux disease)   ? Hernia, hiatal   ? Hyperlipidemia   ? Hypertension   ? Lumbar herniated disc   ? Pre-diabetes   ? Recurrent upper respiratory infection (URI)   ? Vertigo   ?  ?Family History  ?Problem Relation Age of Onset  ? Heart disease Mother   ? Hypertension Mother   ? Hyperlipidemia Mother   ? Heart disease Father   ? Hyperlipidemia Father   ? Hypertension Father   ? Atrial fibrillation Father   ? Cancer Brother   ? Hypertension Brother   ? Hypertension Brother   ? Breast cancer Neg Hx   ? ?Past Surgical History:  ?Procedure Laterality Date  ? ABDOMINAL HYSTERECTOMY  2000  ? TAH/BSO  ? CATARACT EXTRACTION Bilateral   ? WISDOM TOOTH EXTRACTION  1990's  ? ?Social History  ? ?Social History Narrative  ? Not on file  ? ?Immunization History  ?Administered Date(s) Administered  ? Influenza Split 07/28/2012, 09/23/2013, 09/05/2016  ? Influenza,inj,Quad PF,6+ Mos 09/16/2017, 08/23/2020  ? PFIZER(Purple Top)SARS-COV-2 Vaccination 12/28/2019, 01/18/2020, 09/13/2020, 07/26/2021  ? Pneumococcal Polysaccharide-23 11/05/2000  ? Tdap 05/27/2013  ?  ? ?Objective: ?Vital Signs: BP (!) 147/61 (BP Location: Right Arm, Patient Position: Sitting, Cuff Size: Normal)   Pulse 63   Resp 14   Ht 5' 5.25" (1.657 m)   Wt 154 lb (69.9 kg)   BMI 25.43 kg/m?   ? ?Physical Exam ?HENT:  ?   Mouth/Throat:  ?   Mouth: Mucous membranes are moist.  ?   Pharynx: Oropharynx is clear.  ?Eyes:  ?   Conjunctiva/sclera: Conjunctivae normal.  ?Cardiovascular:  ?   Rate and Rhythm: Normal rate and regular rhythm.  ?Pulmonary:  ?   Effort: Pulmonary effort is normal.  ?   Breath sounds: Normal breath sounds.  ?Musculoskeletal:  ?   Right lower leg: No edema.  ?   Left lower leg: No edema.   ?Skin: ?   General: Skin is warm and dry.  ?   Comments: Solid nontender raised nodules on left knee, left shoulder, and right shoulder  ?Neurological:  ?   Mental Status: She is alert.  ?Psychiatric:     ?   Mood and Affect: Mood normal.  ?  ? ?Musculoskeletal Exam:  ?Shoulders full ROM no tenderness or swelling ?Elbows full ROM no tenderness or swelling ?Wrists full ROM no tenderness or swelling ?Fingers full ROM no tenderness or swelling ?Knees full ROM no tenderness or swelling ?Ankles full ROM no tenderness or swelling ? ? ?Investigation: ?No additional findings. ? ?Imaging: ?No results found. ? ?Recent Labs: ?Lab Results  ?Component Value Date  ? WBC 9.4 09/23/2013  ? HGB 13.5 09/23/2013  ? PLT 264 09/23/2013  ? NA 140 09/23/2013  ? K 4.4 09/23/2013  ? CL 103 09/23/2013  ? CO2 28 09/23/2013  ? GLUCOSE 97 09/23/2013  ? BUN 19 09/23/2013  ?  CREATININE 0.80 09/23/2013  ? BILITOT 0.5 09/23/2013  ? ALKPHOS 83 09/23/2013  ? AST 24 09/23/2013  ? ALT 43 (H) 09/23/2013  ? PROT 7.1 09/23/2013  ? ALBUMIN 4.6 09/23/2013  ? CALCIUM 9.8 09/23/2013  ? GFRAA >89 09/23/2013  ? ? ?Speciality Comments: No specialty comments available. ? ?Procedures:  ?No procedures performed ?Allergies: Iodinated contrast media, Lipitor [atorvastatin], and Metformin and related  ? ?Assessment / Plan:     ?Visit Diagnoses: Chronically dry eyes, bilateral  ?Fatigue  ?Left knee pain - Plan: Rheumatoid factor, C3 and C4, IgG, IgA, IgM ? ?Several ongoing symptoms she had previous negative serology for Sjogren's syndrome does not have other clinical features indicating lupus or other overlap connective tissue disease.  I do not think she is a good candidate for minor salivary gland biopsy considering the persistent numbness she already had from her recent dental procedure.  We will check other markers for this inflammatory process rheumatoid factor serum complements and quantitative immunoglobulins.  I have a lower pretest suspicion for active  systemic connective tissue disease so would not recommend any DMARD treatment at this time unless abnormal serologic findings. ?Symptomatic management would be appropriate.  She is already on Systane and Restasis eyedrops and hydra

## 2022-02-17 LAB — IGG, IGA, IGM
IgG (Immunoglobin G), Serum: 999 mg/dL (ref 600–1540)
IgM, Serum: 53 mg/dL (ref 50–300)
Immunoglobulin A: 132 mg/dL (ref 70–320)

## 2022-02-17 LAB — C3 AND C4
C3 Complement: 140 mg/dL (ref 83–193)
C4 Complement: 33 mg/dL (ref 15–57)

## 2022-02-17 LAB — RHEUMATOID FACTOR: Rheumatoid fact SerPl-aCnc: <14 IU/mL (ref <14)

## 2022-03-05 ENCOUNTER — Ambulatory Visit (INDEPENDENT_AMBULATORY_CARE_PROVIDER_SITE_OTHER): Payer: PPO

## 2022-03-05 DIAGNOSIS — J309 Allergic rhinitis, unspecified: Secondary | ICD-10-CM

## 2022-03-21 DIAGNOSIS — D120 Benign neoplasm of cecum: Secondary | ICD-10-CM | POA: Diagnosis not present

## 2022-03-21 DIAGNOSIS — K644 Residual hemorrhoidal skin tags: Secondary | ICD-10-CM | POA: Diagnosis not present

## 2022-03-21 DIAGNOSIS — K648 Other hemorrhoids: Secondary | ICD-10-CM | POA: Diagnosis not present

## 2022-03-21 DIAGNOSIS — K317 Polyp of stomach and duodenum: Secondary | ICD-10-CM | POA: Diagnosis not present

## 2022-03-21 DIAGNOSIS — D125 Benign neoplasm of sigmoid colon: Secondary | ICD-10-CM | POA: Diagnosis not present

## 2022-03-21 DIAGNOSIS — K319 Disease of stomach and duodenum, unspecified: Secondary | ICD-10-CM | POA: Diagnosis not present

## 2022-03-21 DIAGNOSIS — R12 Heartburn: Secondary | ICD-10-CM | POA: Diagnosis not present

## 2022-03-21 DIAGNOSIS — Z1211 Encounter for screening for malignant neoplasm of colon: Secondary | ICD-10-CM | POA: Diagnosis not present

## 2022-03-21 DIAGNOSIS — K219 Gastro-esophageal reflux disease without esophagitis: Secondary | ICD-10-CM | POA: Diagnosis not present

## 2022-03-21 LAB — HM COLONOSCOPY

## 2022-03-23 DIAGNOSIS — K317 Polyp of stomach and duodenum: Secondary | ICD-10-CM | POA: Diagnosis not present

## 2022-03-23 DIAGNOSIS — D120 Benign neoplasm of cecum: Secondary | ICD-10-CM | POA: Diagnosis not present

## 2022-03-23 DIAGNOSIS — D125 Benign neoplasm of sigmoid colon: Secondary | ICD-10-CM | POA: Diagnosis not present

## 2022-03-23 DIAGNOSIS — K319 Disease of stomach and duodenum, unspecified: Secondary | ICD-10-CM | POA: Diagnosis not present

## 2022-03-23 DIAGNOSIS — K219 Gastro-esophageal reflux disease without esophagitis: Secondary | ICD-10-CM | POA: Diagnosis not present

## 2022-03-29 ENCOUNTER — Ambulatory Visit (INDEPENDENT_AMBULATORY_CARE_PROVIDER_SITE_OTHER): Payer: PPO

## 2022-03-29 DIAGNOSIS — J309 Allergic rhinitis, unspecified: Secondary | ICD-10-CM | POA: Diagnosis not present

## 2022-04-04 DIAGNOSIS — J3081 Allergic rhinitis due to animal (cat) (dog) hair and dander: Secondary | ICD-10-CM | POA: Diagnosis not present

## 2022-04-04 NOTE — Progress Notes (Signed)
VIALS EXP 04-05-23 

## 2022-04-05 DIAGNOSIS — J3089 Other allergic rhinitis: Secondary | ICD-10-CM | POA: Diagnosis not present

## 2022-04-10 ENCOUNTER — Encounter: Payer: Self-pay | Admitting: Allergy and Immunology

## 2022-04-10 ENCOUNTER — Ambulatory Visit: Payer: PPO | Admitting: Allergy and Immunology

## 2022-04-10 VITALS — BP 126/80 | HR 66 | Temp 98.3°F | Resp 16 | Ht 66.0 in | Wt 152.0 lb

## 2022-04-10 DIAGNOSIS — J4540 Moderate persistent asthma, uncomplicated: Secondary | ICD-10-CM

## 2022-04-10 DIAGNOSIS — J3089 Other allergic rhinitis: Secondary | ICD-10-CM

## 2022-04-10 DIAGNOSIS — K219 Gastro-esophageal reflux disease without esophagitis: Secondary | ICD-10-CM | POA: Diagnosis not present

## 2022-04-10 MED ORDER — FAMOTIDINE 40 MG PO TABS: 40.0000 mg | ORAL_TABLET | Freq: Every evening | ORAL | 11 refills | Status: DC

## 2022-04-10 MED ORDER — ALBUTEROL SULFATE HFA 108 (90 BASE) MCG/ACT IN AERS
2.0000 | INHALATION_SPRAY | Freq: Four times a day (QID) | RESPIRATORY_TRACT | 2 refills | Status: DC | PRN
Start: 2022-04-10 — End: 2023-03-04

## 2022-04-10 MED ORDER — FLUTICASONE PROPIONATE 50 MCG/ACT NA SUSP: 2.0000 | Freq: Every day | NASAL | 11 refills | Status: AC

## 2022-04-10 MED ORDER — OMEPRAZOLE 40 MG PO CPDR: 40.0000 mg | DELAYED_RELEASE_CAPSULE | Freq: Every morning | ORAL | 11 refills | Status: AC

## 2022-04-10 MED ORDER — OLOPATADINE HCL 0.2 % OP SOLN
1.0000 [drp] | Freq: Two times a day (BID) | OPHTHALMIC | 11 refills | Status: DC | PRN
Start: 2022-04-10 — End: 2023-03-04

## 2022-04-10 NOTE — Progress Notes (Unsigned)
[place]   Follow-up Note  Referring Provider: Maurice Small, MD Primary Provider: Merryl Hacker, No Date of Office Visit: 04/10/2022  Subjective:   Joanne Sanchez (DOB: 01-Jul-1956) is a 66 y.o. female who returns to the Allergy and [place] on 04/10/2022 in re-evaluation of the following:  HPI: Joanne Sanchez presents to this clinic in evaluation of asthma, allergic rhinitis, history of chronic sinusitis, and reflux.  I last saw her in this clinic on 23 August 2020.  She is undergoing a course of immunotherapy currently at every 4 weeks without any adverse effect and this is resulted in dramatic improvement regarding her respiratory tract issue.  She has been able to discontinue her Symbicort and Spiriva and she rarely uses any nasal steroid.  She rarely uses any short acting bronchodilator.  She has not required a systemic steroid or antibiotic for any type of airway issue.  Her reflux is under good control.  She did have an endoscopy performed in May 2023 and apparently she did have some inflammation and was suggested that she consistently use her intermittent famotidine on a daily basis for at least 1 month in addition to using her proton pump inhibitor.  Her dry eye issues going pretty well.  She does remain away from most antihistamine use.  She describes "heart beating" in her right ear which has been an issue for a while now.  She has seen Dr. Lorelee Sanchez, ENT in the past for an issue with tendinitis and Joanne Sanchez.  Allergies as of 04/10/2022       Reactions   Iodinated Contrast Media Hives, Swelling   Prior reaction to ivp contrast Patient pre-medicated w/ 13 hour prep prednisone and benadryl   Lipitor [atorvastatin] Other (See Comments)   MUSCLE CRAMPS   Metformin And Related Diarrhea        Medication List    amLODipine 5 MG tablet Commonly known as: NORVASC Take 5 mg by mouth daily.   azelastine 0.1 % nasal spray Commonly known as:  ASTELIN 1 spray each nostril 1-2 times per day   budesonide-formoterol 80-4.5 MCG/ACT inhaler Commonly known as: Symbicort 2 puffs 1-2 times per day.   cycloSPORINE 0.05 % ophthalmic emulsion Commonly known as: RESTASIS 1 drop into affected eye   Cequa 0.09 % Soln Generic drug: cycloSPORINE (PF) Apply 1 drop to eye 2 (two) times daily.   Fish Oil 1000 MG Caps 1 capsule   levocetirizine 5 MG tablet Commonly known as: XYZAL 1 tablet 1-2 times per day as needed.   MAGNESIUM CHLORIDE-CALCIUM PO Take by mouth daily. Reported on 05/04/2016   omeprazole 40 MG capsule Commonly known as: PRILOSEC Take 1 capsule (40 mg total) by mouth daily.   ProAir HFA 108 (90 Base) MCG/ACT inhaler Generic drug: albuterol Inhale 2 puffs into the lungs every 4 (four) hours as needed for wheezing or shortness of breath.   rosuvastatin 5 MG tablet Commonly known as: CRESTOR 1 tablet   Super B Complex/Vitamin C Tabs 1 tablet   Vitamin D-3 125 MCG (5000 UT) Tabs Take 5,000 Units by mouth daily.    Past Medical History:  Diagnosis Date   Allergy    Anemia    Asthma    Cavernous angioma    Environmental allergies    GERD (gastroesophageal reflux disease)    Hernia, hiatal    Hyperlipidemia    Hypertension    Lumbar herniated disc    Pre-diabetes  Recurrent upper respiratory infection (URI)    Vertigo     Past Surgical History:  Procedure Laterality Date   ABDOMINAL HYSTERECTOMY  2000   TAH/BSO   CATARACT EXTRACTION Bilateral    WISDOM TOOTH EXTRACTION  1990's    Review of systems negative except as noted in HPI / PMHx or noted below:  Review of Systems  Constitutional: Negative.   HENT: Negative.    Eyes: Negative.   Respiratory: Negative.    Cardiovascular: Negative.   Gastrointestinal: Negative.   Genitourinary: Negative.   Musculoskeletal: Negative.   Skin: Negative.   Neurological: Negative.   Endo/Heme/Allergies: Negative.   Psychiatric/Behavioral: Negative.       Objective:   Vitals:   04/10/22 1556  BP: 126/80  Pulse: 66  Resp: 16  Temp: 98.3 F (36.8 C)  SpO2: 94%   Height: '5\' 6"'$  (167.6 cm)  Weight: 152 lb (68.9 kg)   Physical Exam Constitutional:      Appearance: She is not diaphoretic.  HENT:     Head: Normocephalic.     Right Ear: Tympanic membrane, ear canal and external ear normal.     Left Ear: Tympanic membrane, ear canal and external ear normal.     Nose: Nose normal. No mucosal edema or rhinorrhea.     Mouth/Throat:     Pharynx: Uvula midline. No oropharyngeal exudate.  Eyes:     Conjunctiva/sclera: Conjunctivae normal.  Neck:     Thyroid: No thyromegaly.     Trachea: Trachea normal. No tracheal tenderness or tracheal deviation.  Cardiovascular:     Rate and Rhythm: Normal rate and regular rhythm.     Heart sounds: Normal heart sounds, S1 normal and S2 normal. No murmur heard. Pulmonary:     Effort: No respiratory distress.     Breath sounds: Normal breath sounds. No stridor. No wheezing or rales.  Lymphadenopathy:     Head:     Right side of head: No tonsillar adenopathy.     Left side of head: No tonsillar adenopathy.     Cervical: No cervical adenopathy.  Skin:    Findings: No erythema or rash.     Nails: There is no clubbing.  Neurological:     Mental Status: She is alert.    Diagnostics:    Spirometry was performed and demonstrated an FEV1 of 1.89 at 78 % of predicted.  The patient had an Asthma Control Test with the following results:  .    Assessment and Plan:   No diagnosis found.  Patient Instructions   1.  Continue Immunotherapy  2. Continue to treat reflux / LPR:   A. Omeprazole 40 mg in AM  B. Famotidine 40 mg in PM   3. If needed:   A. Flonase - 1-2 sprays each nostril 3-7 times per week  B. Proair HFA or similar 2 inhalations every 4-6 hours if needed  C. Eye drops  D. Nasal saline  4. Return to clinic in 6 months or earlier if problem         Allena Katz,  MD [facility]

## 2022-04-10 NOTE — Patient Instructions (Addendum)
  1.  Continue Immunotherapy  2. Continue to treat reflux / LPR:   A. Omeprazole 40 mg in AM  B. Famotidine 40 mg in PM   3. If needed:   A. Flonase - 1-2 sprays each nostril 3-7 times per week  B. Proair HFA or similar 2 inhalations every 4-6 hours   C. Eye drops  D. Nasal saline  4. Return to clinic in 12 months or earlier if problem

## 2022-04-11 ENCOUNTER — Encounter: Payer: Self-pay | Admitting: Allergy and Immunology

## 2022-04-16 DIAGNOSIS — H0102B Squamous blepharitis left eye, upper and lower eyelids: Secondary | ICD-10-CM | POA: Diagnosis not present

## 2022-04-16 DIAGNOSIS — H40013 Open angle with borderline findings, low risk, bilateral: Secondary | ICD-10-CM | POA: Diagnosis not present

## 2022-04-16 DIAGNOSIS — Z961 Presence of intraocular lens: Secondary | ICD-10-CM | POA: Diagnosis not present

## 2022-04-16 DIAGNOSIS — H43813 Vitreous degeneration, bilateral: Secondary | ICD-10-CM | POA: Diagnosis not present

## 2022-04-16 DIAGNOSIS — H0288B Meibomian gland dysfunction left eye, upper and lower eyelids: Secondary | ICD-10-CM | POA: Diagnosis not present

## 2022-04-16 DIAGNOSIS — H0102A Squamous blepharitis right eye, upper and lower eyelids: Secondary | ICD-10-CM | POA: Diagnosis not present

## 2022-04-16 DIAGNOSIS — H0288A Meibomian gland dysfunction right eye, upper and lower eyelids: Secondary | ICD-10-CM | POA: Diagnosis not present

## 2022-04-24 ENCOUNTER — Telehealth: Payer: Self-pay | Admitting: Internal Medicine

## 2022-04-24 NOTE — Telephone Encounter (Signed)
Patient left a voicemail stating she had an appointment with Dr. Benjamine Mola in April and had labwork done.  Patient states she never received a call from the office explaining the results.  Patient requested a return call.

## 2022-05-03 ENCOUNTER — Ambulatory Visit (INDEPENDENT_AMBULATORY_CARE_PROVIDER_SITE_OTHER): Payer: PPO

## 2022-05-03 DIAGNOSIS — J309 Allergic rhinitis, unspecified: Secondary | ICD-10-CM | POA: Diagnosis not present

## 2022-05-14 DIAGNOSIS — Z0000 Encounter for general adult medical examination without abnormal findings: Secondary | ICD-10-CM | POA: Diagnosis not present

## 2022-05-14 DIAGNOSIS — I10 Essential (primary) hypertension: Secondary | ICD-10-CM | POA: Diagnosis not present

## 2022-05-14 DIAGNOSIS — E559 Vitamin D deficiency, unspecified: Secondary | ICD-10-CM | POA: Diagnosis not present

## 2022-05-14 DIAGNOSIS — R7303 Prediabetes: Secondary | ICD-10-CM | POA: Diagnosis not present

## 2022-05-14 DIAGNOSIS — E785 Hyperlipidemia, unspecified: Secondary | ICD-10-CM | POA: Diagnosis not present

## 2022-05-14 DIAGNOSIS — R229 Localized swelling, mass and lump, unspecified: Secondary | ICD-10-CM | POA: Diagnosis not present

## 2022-05-14 DIAGNOSIS — Z1159 Encounter for screening for other viral diseases: Secondary | ICD-10-CM | POA: Diagnosis not present

## 2022-05-14 DIAGNOSIS — R252 Cramp and spasm: Secondary | ICD-10-CM | POA: Diagnosis not present

## 2022-05-14 DIAGNOSIS — M8589 Other specified disorders of bone density and structure, multiple sites: Secondary | ICD-10-CM | POA: Diagnosis not present

## 2022-05-21 DIAGNOSIS — Z23 Encounter for immunization: Secondary | ICD-10-CM | POA: Diagnosis not present

## 2022-05-30 ENCOUNTER — Ambulatory Visit (INDEPENDENT_AMBULATORY_CARE_PROVIDER_SITE_OTHER): Payer: PPO

## 2022-05-30 DIAGNOSIS — J309 Allergic rhinitis, unspecified: Secondary | ICD-10-CM | POA: Diagnosis not present

## 2022-05-31 NOTE — Telephone Encounter (Signed)
Left voicemail trying to call back today will call back again from clinic office number or can give her a message if she calls back before then.  Lab results were negative for rheumatoid factor and serum complement and immunoglobulin levels were normal. Along with negative antibody tests I do not see evidence for sjogren syndrome causing her symptoms or needing follow up to treat with medication for a systemic autoimmune disease.  I did recommend that even if results are negative we could follow up if needed about trying a medication like pilocarpine for these specific symptoms. She was already doing appropriate conservative and OTC treatments like drops, hydration, lozenges.

## 2022-06-01 NOTE — Telephone Encounter (Signed)
I called patient, patient verbalized understanding. 

## 2022-06-13 ENCOUNTER — Encounter (INDEPENDENT_AMBULATORY_CARE_PROVIDER_SITE_OTHER): Payer: Self-pay

## 2022-06-22 DIAGNOSIS — H9311 Tinnitus, right ear: Secondary | ICD-10-CM | POA: Diagnosis not present

## 2022-06-22 DIAGNOSIS — H9041 Sensorineural hearing loss, unilateral, right ear, with unrestricted hearing on the contralateral side: Secondary | ICD-10-CM | POA: Diagnosis not present

## 2022-06-26 ENCOUNTER — Other Ambulatory Visit: Payer: Self-pay | Admitting: Otolaryngology

## 2022-06-26 DIAGNOSIS — H93A1 Pulsatile tinnitus, right ear: Secondary | ICD-10-CM

## 2022-06-27 ENCOUNTER — Ambulatory Visit (INDEPENDENT_AMBULATORY_CARE_PROVIDER_SITE_OTHER): Payer: PPO

## 2022-06-27 DIAGNOSIS — M8589 Other specified disorders of bone density and structure, multiple sites: Secondary | ICD-10-CM | POA: Diagnosis not present

## 2022-06-27 DIAGNOSIS — J309 Allergic rhinitis, unspecified: Secondary | ICD-10-CM | POA: Diagnosis not present

## 2022-06-27 DIAGNOSIS — Z780 Asymptomatic menopausal state: Secondary | ICD-10-CM | POA: Diagnosis not present

## 2022-06-27 LAB — HM DEXA SCAN: HM Dexa Scan: 1.3

## 2022-07-30 ENCOUNTER — Telehealth: Payer: Self-pay

## 2022-07-30 ENCOUNTER — Other Ambulatory Visit: Payer: PPO

## 2022-07-30 ENCOUNTER — Ambulatory Visit
Admission: RE | Admit: 2022-07-30 | Discharge: 2022-07-30 | Disposition: A | Discharge status: Home / Self Care | Payer: PPO | Source: Ambulatory Visit | Attending: Otolaryngology | Admitting: Otolaryngology

## 2022-07-30 DIAGNOSIS — H93A1 Pulsatile tinnitus, right ear: Secondary | ICD-10-CM

## 2022-07-30 NOTE — Telephone Encounter (Signed)
Pt has upcoming CT scan with [facility] on 07/30/22 @ 1400 with contrast. However, in the patients chart it states she has an allergy to the contrast. I have made multiple attempts on different days to reach the patient to discuss her allergy and call in a 13 hr prep without any return phone call. Pts emergency contact was also called and a voicemail was left. Attempted to reach the patient on 07/23/22, 07/24/22, 07/25/22, 07/26/22, 07/27/22. Since we were not able to contact the patient to call in her 13 hr prep, she will not be able to receive a CT scan with contrast today. The CT department has been notified of this.

## 2022-07-31 ENCOUNTER — Ambulatory Visit (INDEPENDENT_AMBULATORY_CARE_PROVIDER_SITE_OTHER): Payer: PPO | Admitting: *Deleted

## 2022-07-31 DIAGNOSIS — J309 Allergic rhinitis, unspecified: Secondary | ICD-10-CM

## 2022-08-07 ENCOUNTER — Ambulatory Visit (INDEPENDENT_AMBULATORY_CARE_PROVIDER_SITE_OTHER): Payer: PPO

## 2022-08-07 DIAGNOSIS — J309 Allergic rhinitis, unspecified: Secondary | ICD-10-CM | POA: Diagnosis not present

## 2022-08-15 ENCOUNTER — Ambulatory Visit (INDEPENDENT_AMBULATORY_CARE_PROVIDER_SITE_OTHER): Payer: PPO

## 2022-08-15 DIAGNOSIS — J309 Allergic rhinitis, unspecified: Secondary | ICD-10-CM | POA: Diagnosis not present

## 2022-08-20 ENCOUNTER — Ambulatory Visit
Admission: RE | Admit: 2022-08-20 | Discharge: 2022-08-20 | Disposition: A | Discharge status: Home / Self Care | Payer: PPO | Source: Ambulatory Visit | Attending: Otolaryngology | Admitting: Otolaryngology

## 2022-08-20 DIAGNOSIS — Z9889 Other specified postprocedural states: Secondary | ICD-10-CM | POA: Diagnosis not present

## 2022-08-20 DIAGNOSIS — H9190 Unspecified hearing loss, unspecified ear: Secondary | ICD-10-CM | POA: Diagnosis not present

## 2022-08-20 DIAGNOSIS — H93A9 Pulsatile tinnitus, unspecified ear: Secondary | ICD-10-CM | POA: Diagnosis not present

## 2022-08-20 DIAGNOSIS — G939 Disorder of brain, unspecified: Secondary | ICD-10-CM | POA: Diagnosis not present

## 2022-08-20 MED ORDER — IOPAMIDOL (ISOVUE-370) INJECTION 76%
75.0000 mL | Freq: Once | INTRAVENOUS | Status: AC | PRN
  Administered 2022-08-20: 75 mL via INTRAVENOUS

## 2022-08-21 ENCOUNTER — Ambulatory Visit (INDEPENDENT_AMBULATORY_CARE_PROVIDER_SITE_OTHER): Payer: PPO

## 2022-08-21 DIAGNOSIS — J309 Allergic rhinitis, unspecified: Secondary | ICD-10-CM | POA: Diagnosis not present

## 2022-08-28 ENCOUNTER — Ambulatory Visit (INDEPENDENT_AMBULATORY_CARE_PROVIDER_SITE_OTHER): Payer: PPO | Admitting: *Deleted

## 2022-08-28 DIAGNOSIS — J309 Allergic rhinitis, unspecified: Secondary | ICD-10-CM | POA: Diagnosis not present

## 2022-09-12 DIAGNOSIS — H93A1 Pulsatile tinnitus, right ear: Secondary | ICD-10-CM | POA: Diagnosis not present

## 2022-09-18 ENCOUNTER — Other Ambulatory Visit: Payer: Self-pay | Admitting: Neurosurgery

## 2022-09-18 DIAGNOSIS — H93A1 Pulsatile tinnitus, right ear: Secondary | ICD-10-CM

## 2022-09-25 ENCOUNTER — Ambulatory Visit (INDEPENDENT_AMBULATORY_CARE_PROVIDER_SITE_OTHER): Payer: PPO

## 2022-09-25 DIAGNOSIS — J309 Allergic rhinitis, unspecified: Secondary | ICD-10-CM | POA: Diagnosis not present

## 2022-10-02 ENCOUNTER — Other Ambulatory Visit (HOSPITAL_COMMUNITY): Payer: Self-pay | Admitting: Neurosurgery

## 2022-10-02 ENCOUNTER — Other Ambulatory Visit: Payer: Self-pay

## 2022-10-02 ENCOUNTER — Ambulatory Visit (HOSPITAL_COMMUNITY)
Admission: RE | Admit: 2022-10-02 | Discharge: 2022-10-02 | Disposition: A | Discharge status: Home / Self Care | Payer: PPO | Source: Ambulatory Visit | Attending: Neurosurgery | Admitting: Neurosurgery

## 2022-10-02 DIAGNOSIS — H93A1 Pulsatile tinnitus, right ear: Secondary | ICD-10-CM

## 2022-10-02 HISTORY — PX: IR ANGIO EXTRACRAN SEL COM CAROTID INNOMINATE UNI BILAT MOD SED: IMG5357

## 2022-10-02 HISTORY — PX: IR ANGIO EXTERNAL CAROTID SEL EXT CAROTID BILAT MOD SED: IMG5372

## 2022-10-02 HISTORY — PX: IR ANGIO VERTEBRAL SEL VERTEBRAL BILAT MOD SED: IMG5369

## 2022-10-02 HISTORY — PX: IR ANGIO INTRA EXTRACRAN SEL INTERNAL CAROTID BILAT MOD SED: IMG5363

## 2022-10-02 LAB — URINALYSIS, ROUTINE W REFLEX MICROSCOPIC
Bilirubin Urine: NEGATIVE
Glucose, UA: >=500 mg/dL — AB
Hgb urine dipstick: NEGATIVE
Ketones, ur: NEGATIVE mg/dL
Leukocytes,Ua: NEGATIVE
Nitrite: NEGATIVE
Protein, ur: NEGATIVE mg/dL
Specific Gravity, Urine: 1.025 (ref 1.005–1.030)
pH: 5 (ref 5.0–8.0)

## 2022-10-02 LAB — BASIC METABOLIC PANEL
Anion gap: 10 (ref 5–15)
BUN: 16 mg/dL (ref 8–23)
CO2: 24 mmol/L (ref 22–32)
Calcium: 9.8 mg/dL (ref 8.9–10.3)
Chloride: 106 mmol/L (ref 98–111)
Creatinine, Ser: 0.74 mg/dL (ref 0.44–1.00)
GFR, Estimated: >60 mL/min (ref >60)
Glucose, Bld: 184 mg/dL — ABNORMAL HIGH (ref 70–99)
Potassium: 3.8 mmol/L (ref 3.5–5.1)
Sodium: 140 mmol/L (ref 135–145)

## 2022-10-02 LAB — URINALYSIS, MICROSCOPIC (REFLEX): Bacteria, UA: NONE SEEN

## 2022-10-02 LAB — CBC WITH DIFFERENTIAL/PLATELET
Abs Immature Granulocytes: 0.12 10*3/uL — ABNORMAL HIGH (ref 0.00–0.07)
Basophils Absolute: 0 10*3/uL (ref 0.0–0.1)
Basophils Relative: 0 %
Eosinophils Absolute: 0 10*3/uL (ref 0.0–0.5)
Eosinophils Relative: 0 %
HCT: 44.8 % (ref 36.0–46.0)
Hemoglobin: 15 g/dL (ref 12.0–15.0)
Immature Granulocytes: 1 %
Lymphocytes Relative: 14 %
Lymphs Abs: 1.5 10*3/uL (ref 0.7–4.0)
MCH: 31.4 pg (ref 26.0–34.0)
MCHC: 33.5 g/dL (ref 30.0–36.0)
MCV: 93.7 fL (ref 80.0–100.0)
Monocytes Absolute: 0.1 10*3/uL (ref 0.1–1.0)
Monocytes Relative: 1 %
Neutro Abs: 8.9 10*3/uL — ABNORMAL HIGH (ref 1.7–7.7)
Neutrophils Relative %: 84 %
Platelets: 302 10*3/uL (ref 150–400)
RBC: 4.78 MIL/uL (ref 3.87–5.11)
RDW: 12.1 % (ref 11.5–15.5)
WBC: 10.6 10*3/uL — ABNORMAL HIGH (ref 4.0–10.5)
nRBC: 0 % (ref 0.0–0.2)

## 2022-10-02 LAB — PROTIME-INR
INR: 1 (ref 0.8–1.2)
Prothrombin Time: 12.8 seconds (ref 11.4–15.2)

## 2022-10-02 LAB — APTT: aPTT: 26 seconds (ref 24–36)

## 2022-10-02 MED ORDER — HEPARIN SODIUM (PORCINE) 1000 UNIT/ML IJ SOLN
INTRAMUSCULAR | Status: AC | PRN
  Administered 2022-10-02: 2000 [IU] via INTRAVENOUS

## 2022-10-02 MED ORDER — IOHEXOL 300 MG/ML  SOLN: 100.0000 mL | Freq: Once | INTRAMUSCULAR | Status: DC | PRN

## 2022-10-02 MED ORDER — CHLORHEXIDINE GLUCONATE CLOTH 2 % EX PADS: 6.0000 | MEDICATED_PAD | Freq: Once | CUTANEOUS | Status: DC

## 2022-10-02 MED ORDER — LIDOCAINE HCL 1 % IJ SOLN
INTRAMUSCULAR | Status: AC
  Filled 2022-10-02: qty 20

## 2022-10-02 MED ORDER — HYDROCODONE-ACETAMINOPHEN 5-325 MG PO TABS: 1.0000 | ORAL_TABLET | ORAL | Status: DC | PRN

## 2022-10-02 MED ORDER — IOHEXOL 300 MG/ML  SOLN
100.0000 mL | Freq: Once | INTRAMUSCULAR | Status: AC | PRN
  Administered 2022-10-02: 40 mL via INTRA_ARTERIAL

## 2022-10-02 MED ORDER — FENTANYL CITRATE (PF) 100 MCG/2ML IJ SOLN
INTRAMUSCULAR | Status: AC
  Filled 2022-10-02: qty 2

## 2022-10-02 MED ORDER — MIDAZOLAM HCL 2 MG/2ML IJ SOLN
INTRAMUSCULAR | Status: AC
  Filled 2022-10-02: qty 2

## 2022-10-02 MED ORDER — HEPARIN SODIUM (PORCINE) 1000 UNIT/ML IJ SOLN
INTRAMUSCULAR | Status: AC
  Filled 2022-10-02: qty 2

## 2022-10-02 MED ORDER — CEFAZOLIN SODIUM-DEXTROSE 2-4 GM/100ML-% IV SOLN: 2.0000 g | INTRAVENOUS | Status: DC

## 2022-10-02 MED ORDER — FENTANYL CITRATE (PF) 100 MCG/2ML IJ SOLN
INTRAMUSCULAR | Status: AC | PRN
  Administered 2022-10-02: 25 ug via INTRAVENOUS

## 2022-10-02 MED ORDER — MIDAZOLAM HCL 2 MG/2ML IJ SOLN
INTRAMUSCULAR | Status: AC | PRN
  Administered 2022-10-02: 1 mg via INTRAVENOUS

## 2022-10-02 MED ORDER — SODIUM CHLORIDE 0.9 % IV SOLN: INTRAVENOUS | Status: DC

## 2022-10-02 NOTE — H&P (Signed)
Chief Complaint   Tinnitus  History of Present Illness  Joanne Sanchez is a 66 year old woman I am seeing in follow-up. She was initially seen about 18 months ago after incidental discovery of left frontal cavernomas. These were discovered during a workup of some hearing loss. She has recently undergone a follow-up CT scan which demonstrated some hyperdensity in that region of the known cavernoma. In addition, she also has been complaining of right-sided pulsatile tinnitus. She says she can feel her heartbeat in her right ear, most pronounced when she is lying down to go to bed and when it is quiet. Interestingly, she notes there are certain positions of her head where it seems the pulsatile tinnitus is not as loud. She does not really report the pulsatile tinnitus in the left ear where she had hearing loss.   Past Medical History   Past Medical History:  Diagnosis Date   Allergy    Anemia    Asthma    Cavernous angioma    Environmental allergies    GERD (gastroesophageal reflux disease)    Hernia, hiatal    Hyperlipidemia    Hypertension    Lumbar herniated disc    Pre-diabetes    Recurrent upper respiratory infection (URI)    Vertigo     Past Surgical History   Past Surgical History:  Procedure Laterality Date   ABDOMINAL HYSTERECTOMY  2000   TAH/BSO   CATARACT EXTRACTION Bilateral    WISDOM TOOTH EXTRACTION  1990's    Social History   Social History   Tobacco Use   Smoking status: Never   Smokeless tobacco: Never  Vaping Use   Vaping Use: Never used  Substance Use Topics   Alcohol use: Yes    Alcohol/week: 0.0 standard drinks of alcohol    Comment: occasionally   Drug use: No    Medications   Prior to Admission medications   Medication Sig Start Date End Date Taking? Authorizing Provider  amLODipine (NORVASC) 5 MG tablet Take 5 mg by mouth daily.   Yes [provider]  B Complex-C (SUPER B COMPLEX/VITAMIN C) TABS 1 tablet   Yes [provider]  CEQUA 0.09 % SOLN Apply 1 drop to eye 2 (two) times daily. 11/16/21  Yes [provider]  Cholecalciferol (VITAMIN D-3) 5000 UNITS TABS Take 5,000 Units by mouth daily.   Yes [provider]  cycloSPORINE (RESTASIS) 0.05 % ophthalmic emulsion 1 drop into affected eye   Yes [provider]  diphenhydrAMINE (BENADRYL) 50 MG capsule Take 50 mg by mouth once. Took this morning   Yes [provider]  MAGNESIUM CHLORIDE-CALCIUM PO Take by mouth daily. Reported on 05/04/2016   Yes [provider]  Omega-3 Fatty Acids (FISH OIL) 1000 MG CAPS 1 capsule   Yes [provider]  predniSONE (DELTASONE) 50 MG tablet Take 50 mg by mouth 3 (three) times daily as needed (Before procedure).   Yes [provider]  rosuvastatin (CRESTOR) 5 MG tablet 1 tablet 01/24/21  Yes [provider]  albuterol (VENTOLIN HFA) 108 (90 Base) MCG/ACT inhaler Inhale 2 puffs into the lungs every 6 (six) hours as needed for wheezing or shortness of breath. 04/10/22   Kozlow, Donnamarie Poag, MD  azelastine (ASTELIN) 0.1 % nasal spray 1 spray each nostril 1-2 times per day 08/23/20   Jiles Prows, MD  budesonide-formoterol (SYMBICORT) 80-4.5 MCG/ACT inhaler 2 puffs 1-2 times per day. 11/09/20   Kozlow, Donnamarie Poag, MD  famotidine (PEPCID)  40 MG tablet Take 1 tablet (40 mg total) by mouth at bedtime. 04/10/22   Kozlow, Donnamarie Poag, MD  fluticasone (FLONASE) 50 MCG/ACT nasal spray Place 2 sprays into both nostrils daily. 04/10/22   Kozlow, Donnamarie Poag, MD  levocetirizine (XYZAL) 5 MG tablet 1 tablet 1-2 times per day as needed. 08/23/20   Kozlow, Donnamarie Poag, MD  Olopatadine HCl (PATADAY) 0.2 % SOLN Place 1 drop into both eyes 2 (two) times daily as needed. 04/10/22   Kozlow, Donnamarie Poag, MD  omeprazole (PRILOSEC) 40 MG capsule Take 1 capsule (40 mg total) by mouth in the morning. 04/10/22   Kozlow, Donnamarie Poag, MD    Allergies   Allergies  Allergen Reactions   Iodinated Contrast Media Hives and  Swelling    Prior reaction to ivp contrast Patient pre-medicated w/ 13 hour prep prednisone and benadryl    Lipitor [Atorvastatin] Other (See Comments)    MUSCLE CRAMPS   Metformin And Related Diarrhea    Review of Systems  ROS  Neurologic Exam  Awake, alert, oriented Memory and concentration grossly intact Speech fluent, appropriate CN grossly intact Motor exam: Upper Extremities Deltoid Bicep Tricep Grip  Right 5/5 5/5 5/5 5/5  Left 5/5 5/5 5/5 5/5   Lower Extremities IP Quad PF DF EHL  Right 5/5 5/5 5/5 5/5 5/5  Left 5/5 5/5 5/5 5/5 5/5   Sensation grossly intact to LT   Impression  - 66 y.o. 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 We have also reviewed in detail the risks, benefits, and alternatives to the procedure. All questions were answered and Joanne Sanchez provided informed consent to proceed.  Consuella Lose, MD [facility]

## 2022-10-02 NOTE — Brief Op Note (Signed)
  NEUROSURGERY BRIEF OPERATIVE  NOTE   PREOP DX: Right pulsatile tinnitus  POSTOP DX: Same  PROCEDURE: Diagnostic cerebral angiogram  SURGEON: Dr. Consuella Lose, MD  ANESTHESIA: IV Sedation with Local  APPROACH: Right trans-femoral  EBL: Minimal  SPECIMENS: None  COMPLICATIONS: None  CONDITION: Stable to recovery  FINDINGS (Full report in [facility]): 1. Normal cerebral angiogram, specifically without evidence for arteriovenous malformation or fistula to explain pulsatile tinnitus.   Consuella Lose, MD [facility]

## 2022-10-02 NOTE — Sedation Documentation (Signed)
Pt transported to Short stay via bed accompanied by RN. Anisha RN at bedside to receive patient. Handoff complete. Right groin remains level 0 and Bilateral lower distal pulses palpable. Pt awake alert and oriented. No s/s of distress at this time.

## 2022-10-03 DIAGNOSIS — D3613 Benign neoplasm of peripheral nerves and autonomic nervous system of lower limb, including hip: Secondary | ICD-10-CM | POA: Diagnosis not present

## 2022-10-03 DIAGNOSIS — L821 Other seborrheic keratosis: Secondary | ICD-10-CM | POA: Diagnosis not present

## 2022-10-03 DIAGNOSIS — D2239 Melanocytic nevi of other parts of face: Secondary | ICD-10-CM | POA: Diagnosis not present

## 2022-10-04 DIAGNOSIS — R8290 Unspecified abnormal findings in urine: Secondary | ICD-10-CM | POA: Diagnosis not present

## 2022-10-04 DIAGNOSIS — R7303 Prediabetes: Secondary | ICD-10-CM | POA: Diagnosis not present

## 2022-10-04 DIAGNOSIS — R81 Glycosuria: Secondary | ICD-10-CM | POA: Diagnosis not present

## 2022-10-08 ENCOUNTER — Other Ambulatory Visit (HOSPITAL_COMMUNITY): Payer: Self-pay | Admitting: Neurosurgery

## 2022-10-08 ENCOUNTER — Encounter (HOSPITAL_COMMUNITY): Payer: Self-pay

## 2022-10-08 DIAGNOSIS — H93A1 Pulsatile tinnitus, right ear: Secondary | ICD-10-CM

## 2022-10-11 DIAGNOSIS — Z6825 Body mass index (BMI) 25.0-25.9, adult: Secondary | ICD-10-CM | POA: Diagnosis not present

## 2022-10-11 DIAGNOSIS — H93A1 Pulsatile tinnitus, right ear: Secondary | ICD-10-CM | POA: Diagnosis not present

## 2022-10-24 ENCOUNTER — Ambulatory Visit (INDEPENDENT_AMBULATORY_CARE_PROVIDER_SITE_OTHER): Payer: PPO

## 2022-10-24 DIAGNOSIS — J309 Allergic rhinitis, unspecified: Secondary | ICD-10-CM

## 2022-11-09 DIAGNOSIS — R52 Pain, unspecified: Secondary | ICD-10-CM | POA: Diagnosis not present

## 2022-11-09 DIAGNOSIS — D3610 Benign neoplasm of peripheral nerves and autonomic nervous system, unspecified: Secondary | ICD-10-CM | POA: Diagnosis not present

## 2022-11-22 ENCOUNTER — Ambulatory Visit (INDEPENDENT_AMBULATORY_CARE_PROVIDER_SITE_OTHER): Payer: PPO

## 2022-11-22 DIAGNOSIS — J309 Allergic rhinitis, unspecified: Secondary | ICD-10-CM

## 2022-12-04 DIAGNOSIS — H90A22 Sensorineural hearing loss, unilateral, left ear, with restricted hearing on the contralateral side: Secondary | ICD-10-CM | POA: Diagnosis not present

## 2022-12-04 DIAGNOSIS — Z77122 Contact with and (suspected) exposure to noise: Secondary | ICD-10-CM | POA: Diagnosis not present

## 2022-12-04 DIAGNOSIS — H90A31 Mixed conductive and sensorineural hearing loss, unilateral, right ear with restricted hearing on the contralateral side: Secondary | ICD-10-CM | POA: Diagnosis not present

## 2022-12-04 DIAGNOSIS — Z822 Family history of deafness and hearing loss: Secondary | ICD-10-CM | POA: Diagnosis not present

## 2022-12-04 DIAGNOSIS — H9313 Tinnitus, bilateral: Secondary | ICD-10-CM | POA: Diagnosis not present

## 2022-12-25 DIAGNOSIS — D3613 Benign neoplasm of peripheral nerves and autonomic nervous system of lower limb, including hip: Secondary | ICD-10-CM | POA: Diagnosis not present

## 2023-01-02 DIAGNOSIS — Q8500 Neurofibromatosis, unspecified: Secondary | ICD-10-CM | POA: Diagnosis not present

## 2023-01-02 DIAGNOSIS — Z91041 Radiographic dye allergy status: Secondary | ICD-10-CM | POA: Diagnosis not present

## 2023-01-02 DIAGNOSIS — Q8501 Neurofibromatosis, type 1: Secondary | ICD-10-CM | POA: Diagnosis not present

## 2023-01-04 DIAGNOSIS — Z1231 Encounter for screening mammogram for malignant neoplasm of breast: Secondary | ICD-10-CM | POA: Diagnosis not present

## 2023-01-04 LAB — HM MAMMOGRAPHY

## 2023-01-08 ENCOUNTER — Ambulatory Visit (INDEPENDENT_AMBULATORY_CARE_PROVIDER_SITE_OTHER): Payer: PPO

## 2023-01-08 DIAGNOSIS — J309 Allergic rhinitis, unspecified: Secondary | ICD-10-CM

## 2023-01-09 ENCOUNTER — Other Ambulatory Visit: Payer: Self-pay | Admitting: Radiation Therapy

## 2023-01-09 DIAGNOSIS — Q8500 Neurofibromatosis, unspecified: Secondary | ICD-10-CM

## 2023-01-10 ENCOUNTER — Telehealth: Payer: Self-pay | Admitting: Genetic Counselor

## 2023-01-10 NOTE — Telephone Encounter (Signed)
Scheduled appt per 3/7 referral. Pt is aware of appt date and time. Pt is aware to arrive 15 mins prior to appt time and to bring and updated insurance card. Pt is aware of appt location.

## 2023-01-15 ENCOUNTER — Ambulatory Visit (INDEPENDENT_AMBULATORY_CARE_PROVIDER_SITE_OTHER): Payer: PPO

## 2023-01-15 DIAGNOSIS — J309 Allergic rhinitis, unspecified: Secondary | ICD-10-CM

## 2023-01-29 ENCOUNTER — Telehealth: Payer: Self-pay | Admitting: Genetic Counselor

## 2023-01-29 NOTE — Telephone Encounter (Signed)
Per [facility], patient called requesting phone call from genetic counseling team.  Patient reported previously had genetic counseling [facility] and had some requested.  LVM with contact info requesting call back.

## 2023-01-29 NOTE — Telephone Encounter (Signed)
Patient requested neurofibromatosis genetic testing sooner given that she has appt with new provider on 4/29.  Reviewed PH/FH, discussed benefits/process of genetic testing, insurance coverage, TAT, and possible results.  Reviewed GINA.  Patient wants to check into insurance for GINA considerations before proceeding with testing.  Contact information provided.  Said she will call when she wants to proceed with genetic testing.

## 2023-02-05 NOTE — Progress Notes (Signed)
VIALS EXP 02-05-24

## 2023-02-06 DIAGNOSIS — J3081 Allergic rhinitis due to animal (cat) (dog) hair and dander: Secondary | ICD-10-CM | POA: Diagnosis not present

## 2023-02-07 DIAGNOSIS — J3089 Other allergic rhinitis: Secondary | ICD-10-CM | POA: Diagnosis not present

## 2023-02-15 ENCOUNTER — Ambulatory Visit (INDEPENDENT_AMBULATORY_CARE_PROVIDER_SITE_OTHER): Payer: PPO

## 2023-02-15 DIAGNOSIS — J309 Allergic rhinitis, unspecified: Secondary | ICD-10-CM | POA: Diagnosis not present

## 2023-02-18 DIAGNOSIS — H40013 Open angle with borderline findings, low risk, bilateral: Secondary | ICD-10-CM | POA: Diagnosis not present

## 2023-02-18 DIAGNOSIS — H43813 Vitreous degeneration, bilateral: Secondary | ICD-10-CM | POA: Diagnosis not present

## 2023-02-18 DIAGNOSIS — H0288A Meibomian gland dysfunction right eye, upper and lower eyelids: Secondary | ICD-10-CM | POA: Diagnosis not present

## 2023-02-18 DIAGNOSIS — Z961 Presence of intraocular lens: Secondary | ICD-10-CM | POA: Diagnosis not present

## 2023-02-18 DIAGNOSIS — H0288B Meibomian gland dysfunction left eye, upper and lower eyelids: Secondary | ICD-10-CM | POA: Diagnosis not present

## 2023-02-18 DIAGNOSIS — Q8500 Neurofibromatosis, unspecified: Secondary | ICD-10-CM | POA: Diagnosis not present

## 2023-02-25 ENCOUNTER — Telehealth: Payer: Self-pay | Admitting: Genetic Counselor

## 2023-02-25 NOTE — Telephone Encounter (Signed)
Per Verlin Dike, patient requested genetic appt cancellation.  Knows to call back if interested in rescheduling.

## 2023-02-28 ENCOUNTER — Other Ambulatory Visit: Payer: PPO

## 2023-02-28 ENCOUNTER — Encounter: Payer: PPO | Admitting: Genetic Counselor

## 2023-03-04 ENCOUNTER — Ambulatory Visit (INDEPENDENT_AMBULATORY_CARE_PROVIDER_SITE_OTHER): Payer: PPO | Admitting: Internal Medicine

## 2023-03-04 VITALS — BP 140/72 | HR 82 | Temp 97.8°F | Ht 65.0 in | Wt 155.0 lb

## 2023-03-04 DIAGNOSIS — D3610 Benign neoplasm of peripheral nerves and autonomic nervous system, unspecified: Secondary | ICD-10-CM | POA: Diagnosis not present

## 2023-03-04 DIAGNOSIS — H918X3 Other specified hearing loss, bilateral: Secondary | ICD-10-CM | POA: Diagnosis not present

## 2023-03-04 DIAGNOSIS — D1800 Hemangioma unspecified site: Secondary | ICD-10-CM

## 2023-03-04 DIAGNOSIS — R7303 Prediabetes: Secondary | ICD-10-CM

## 2023-03-04 DIAGNOSIS — E782 Mixed hyperlipidemia: Secondary | ICD-10-CM | POA: Diagnosis not present

## 2023-03-04 DIAGNOSIS — I10 Essential (primary) hypertension: Secondary | ICD-10-CM | POA: Diagnosis not present

## 2023-03-04 DIAGNOSIS — J4520 Mild intermittent asthma, uncomplicated: Secondary | ICD-10-CM

## 2023-03-04 DIAGNOSIS — K219 Gastro-esophageal reflux disease without esophagitis: Secondary | ICD-10-CM | POA: Diagnosis not present

## 2023-03-04 LAB — COMPREHENSIVE METABOLIC PANEL
ALT: 16 U/L (ref 0–35)
AST: 15 U/L (ref 0–37)
Albumin: 4.3 g/dL (ref 3.5–5.2)
Alkaline Phosphatase: 80 U/L (ref 39–117)
BUN: 24 mg/dL — ABNORMAL HIGH (ref 6–23)
CO2: 28 mEq/L (ref 19–32)
Calcium: 9.5 mg/dL (ref 8.4–10.5)
Chloride: 105 mEq/L (ref 96–112)
Creatinine, Ser: 0.68 mg/dL (ref 0.40–1.20)
GFR: 89.34 mL/min (ref >60.00)
Glucose, Bld: 115 mg/dL — ABNORMAL HIGH (ref 70–99)
Potassium: 3.9 mEq/L (ref 3.5–5.1)
Sodium: 141 mEq/L (ref 135–145)
Total Bilirubin: 0.4 mg/dL (ref 0.2–1.2)
Total Protein: 6.9 g/dL (ref 6.0–8.3)

## 2023-03-04 LAB — HEMOGLOBIN A1C: Hgb A1c MFr Bld: 6 % (ref 4.6–6.5)

## 2023-03-04 LAB — LIPID PANEL
Cholesterol: 231 mg/dL — ABNORMAL HIGH (ref 0–200)
HDL: 56.5 mg/dL (ref >39.00)
LDL Cholesterol: 155 mg/dL — ABNORMAL HIGH (ref 0–99)
NonHDL: 174.46
Total CHOL/HDL Ratio: 4
Triglycerides: 95 mg/dL (ref 0.0–149.0)
VLDL: 19 mg/dL (ref 0.0–40.0)

## 2023-03-04 LAB — CBC
HCT: 40.4 % (ref 36.0–46.0)
Hemoglobin: 13.8 g/dL (ref 12.0–15.0)
MCHC: 34.2 g/dL (ref 30.0–36.0)
MCV: 93 fl (ref 78.0–100.0)
Platelets: 225 10*3/uL (ref 150.0–400.0)
RBC: 4.34 Mil/uL (ref 3.87–5.11)
RDW: 12.8 % (ref 11.5–15.5)
WBC: 9.2 10*3/uL (ref 4.0–10.5)

## 2023-03-04 NOTE — Progress Notes (Unsigned)
   Subjective:   Patient ID: Joanne Sanchez, female    DOB: [birth year], 67 y.o.   MRN: [id number]  HPI The patient is a 67 YO female coming in for ongoing care see A/P for concerns.   PMH, [facility], social history reviewed and updated  Review of Systems  Constitutional: Negative.   HENT: Negative.    Eyes: Negative.   Respiratory:  Negative for cough, chest tightness and shortness of breath.   Cardiovascular:  Negative for chest pain, palpitations and leg swelling.  Gastrointestinal:  Negative for abdominal distention, abdominal pain, constipation, diarrhea, nausea and vomiting.  Musculoskeletal: Negative.   Skin: Negative.        neurofibroma  Neurological: Negative.   Psychiatric/Behavioral: Negative.      Objective:  Physical Exam Constitutional:      Appearance: She is well-developed.  HENT:     Head: Normocephalic and atraumatic.  Cardiovascular:     Rate and Rhythm: Normal rate and regular rhythm.  Pulmonary:     Effort: Pulmonary effort is normal. No respiratory distress.     Breath sounds: Normal breath sounds. No wheezing or rales.  Abdominal:     General: Bowel sounds are normal. There is no distension.     Palpations: Abdomen is soft.     Tenderness: There is no abdominal tenderness. There is no rebound.  Musculoskeletal:     Cervical back: Normal range of motion.  Skin:    General: Skin is warm and dry.     Comments: neurofibromas  Neurological:     Mental Status: She is alert and oriented to person, place, and time.     Coordination: Coordination normal.     Vitals:   03/04/23 0805 03/04/23 0904  BP: (!) 142/74 (!) 140/72  Pulse: 82   Temp: 97.8 F (36.6 C)   TempSrc: Temporal   SpO2: 98%   Weight: 155 lb (70.3 kg)   Height: 5\' 5"  (1.651 m)     Assessment & Plan:

## 2023-03-04 NOTE — Patient Instructions (Signed)
We will get the records.   

## 2023-03-05 ENCOUNTER — Encounter: Payer: Self-pay | Admitting: Internal Medicine

## 2023-03-05 DIAGNOSIS — D3610 Benign neoplasm of peripheral nerves and autonomic nervous system, unspecified: Secondary | ICD-10-CM | POA: Insufficient documentation

## 2023-03-05 NOTE — Assessment & Plan Note (Signed)
On problem list she is not sure if accurate. Checking HgA1c with labs today.

## 2023-03-05 NOTE — Assessment & Plan Note (Signed)
Uses allergy treatment mostly and has used albuterol prn if needed in the past. Not using now and does not need refill. No flare.

## 2023-03-05 NOTE — Assessment & Plan Note (Signed)
Taking omeprazole and pepcid prn.

## 2023-03-05 NOTE — Assessment & Plan Note (Signed)
New diagnosis in the last year made by dermatology biopsy of lesions on skin. She is pending genetic testing. She desires to know if NF1 or NF2 to help anticipate any need for additional screening. Recent brain imaging without evidence for schwannoma.

## 2023-03-05 NOTE — Assessment & Plan Note (Signed)
Noted on prior imaging and stable on recent brain imaging from within 1 year. Likely does not need follow up dedicated imaging.

## 2023-03-05 NOTE — Assessment & Plan Note (Signed)
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 Pending upcoming genetic testing.

## 2023-03-05 NOTE — Assessment & Plan Note (Signed)
Checking lipid panel and adjust as needed. She is taking crestor 5 mg every other day due to side effects.

## 2023-03-05 NOTE — Assessment & Plan Note (Signed)
BP borderline today and she will monitor at home. Typically at goal at other visits recently. Taking amlodipine 5 mg daily. Can adjust if needed in future. Checking CMP and CBC and adjust as needed.

## 2023-03-08 ENCOUNTER — Encounter: Payer: Self-pay | Admitting: Internal Medicine

## 2023-03-11 ENCOUNTER — Other Ambulatory Visit: Payer: Self-pay | Admitting: Internal Medicine

## 2023-03-11 DIAGNOSIS — R7303 Prediabetes: Secondary | ICD-10-CM

## 2023-03-11 DIAGNOSIS — E782 Mixed hyperlipidemia: Secondary | ICD-10-CM

## 2023-03-18 ENCOUNTER — Ambulatory Visit (INDEPENDENT_AMBULATORY_CARE_PROVIDER_SITE_OTHER): Payer: PPO | Admitting: *Deleted

## 2023-03-18 DIAGNOSIS — J309 Allergic rhinitis, unspecified: Secondary | ICD-10-CM

## 2023-03-26 ENCOUNTER — Ambulatory Visit (INDEPENDENT_AMBULATORY_CARE_PROVIDER_SITE_OTHER): Payer: PPO

## 2023-03-26 ENCOUNTER — Encounter: Payer: Self-pay | Admitting: Family Medicine

## 2023-03-26 DIAGNOSIS — J309 Allergic rhinitis, unspecified: Secondary | ICD-10-CM | POA: Diagnosis not present

## 2023-04-02 ENCOUNTER — Ambulatory Visit (INDEPENDENT_AMBULATORY_CARE_PROVIDER_SITE_OTHER): Payer: PPO | Admitting: *Deleted

## 2023-04-02 DIAGNOSIS — J309 Allergic rhinitis, unspecified: Secondary | ICD-10-CM | POA: Diagnosis not present

## 2023-04-10 ENCOUNTER — Ambulatory Visit (INDEPENDENT_AMBULATORY_CARE_PROVIDER_SITE_OTHER): Payer: PPO | Admitting: *Deleted

## 2023-04-10 DIAGNOSIS — J309 Allergic rhinitis, unspecified: Secondary | ICD-10-CM

## 2023-04-16 ENCOUNTER — Ambulatory Visit (INDEPENDENT_AMBULATORY_CARE_PROVIDER_SITE_OTHER): Payer: PPO

## 2023-04-16 DIAGNOSIS — J309 Allergic rhinitis, unspecified: Secondary | ICD-10-CM

## 2023-04-23 DIAGNOSIS — M25562 Pain in left knee: Secondary | ICD-10-CM | POA: Diagnosis not present

## 2023-04-29 ENCOUNTER — Ambulatory Visit (INDEPENDENT_AMBULATORY_CARE_PROVIDER_SITE_OTHER): Payer: PPO | Admitting: Family Medicine

## 2023-04-29 ENCOUNTER — Encounter: Payer: Self-pay | Admitting: Family Medicine

## 2023-04-29 VITALS — BP 132/72 | HR 78 | Temp 98.2°F | Resp 20 | Ht 65.0 in | Wt 155.0 lb

## 2023-04-29 DIAGNOSIS — J0110 Acute frontal sinusitis, unspecified: Secondary | ICD-10-CM | POA: Diagnosis not present

## 2023-04-29 LAB — POC COVID19 BINAXNOW: SARS Coronavirus 2 Ag: NEGATIVE

## 2023-04-29 MED ORDER — AMOXICILLIN-POT CLAVULANATE 875-125 MG PO TABS
1.0000 | ORAL_TABLET | Freq: Two times a day (BID) | ORAL | 0 refills | Status: AC
Start: 2023-04-29 — End: 2023-05-06

## 2023-04-29 NOTE — Addendum Note (Signed)
Addended by: Magdalene River on: 04/29/2023 04:52 PM   Modules accepted: Orders

## 2023-04-29 NOTE — Progress Notes (Signed)
Assessment & Plan:  1. Acute non-recurrent frontal sinusitis Education provided on sinusitis.  Discussed symptom management. COVID negative. - amoxicillin-clavulanate (AUGMENTIN) 875-125 MG tablet; Take 1 tablet by mouth 2 (two) times daily for 7 days.  Dispense: 14 tablet; Refill: 0   No results found for any visits on 04/29/23.  Follow up plan: Return if symptoms worsen or fail to improve.  Deliah Boston, MSN, APRN, FNP-C  Subjective:  HPI: Joanne Sanchez is a 67 y.o. female presenting on 04/29/2023 for Sore Throat (ST- trouble swallowing, HA and ear pain (R>L) all x 6 days )  Patient complains of headache, sore throat, ear pain/pressure, loss of taste, and fatigue . She denies fever, shortness of breath, and wheezing. Onset of symptoms was 6 days ago, shifting from one side of her face to the other since that time. She is drinking plenty of fluids. Evaluation to date: none. Treatment to date: decongestants and Tylenol . She has a history of asthma. She does not smoke. Daughter was recently ill with similar symptoms and was placed on an antibiotic as her COVID test was negative.    ROS: Negative unless specifically indicated above in HPI.   Relevant past medical history reviewed and updated as indicated.   Allergies and medications reviewed and updated.   Current Outpatient Medications:    amLODipine (NORVASC) 5 MG tablet, Take 5 mg by mouth daily., Disp: , Rfl:    B Complex-C (SUPER B COMPLEX/VITAMIN C) TABS, 1 tablet, Disp: , Rfl:    Cholecalciferol (VITAMIN D-3) 5000 UNITS TABS, Take 5,000 Units by mouth daily., Disp: , Rfl:    cycloSPORINE (RESTASIS) 0.05 % ophthalmic emulsion, 1 drop into affected eye, Disp: , Rfl:    famotidine (PEPCID) 40 MG tablet, Take 1 tablet (40 mg total) by mouth at bedtime., Disp: 30 tablet, Rfl: 11   fluticasone (FLONASE) 50 MCG/ACT nasal spray, Place 2 sprays into both nostrils daily., Disp: 16 g, Rfl: 11   MAGNESIUM CHLORIDE-CALCIUM PO, Take by  mouth daily. Reported on 05/04/2016, Disp: , Rfl:    Omega-3 Fatty Acids (FISH OIL) 1000 MG CAPS, 1 capsule, Disp: , Rfl:    omeprazole (PRILOSEC) 40 MG capsule, Take 1 capsule (40 mg total) by mouth in the morning., Disp: 30 capsule, Rfl: 11   rosuvastatin (CRESTOR) 5 MG tablet, 1 tablet, Disp: , Rfl:    Varenicline Tartrate (TYRVAYA NA), Place into the nose in the morning and at bedtime., Disp: , Rfl:   Allergies  Allergen Reactions   Iodinated Contrast Media Hives and Swelling    Prior reaction to ivp contrast Patient pre-medicated w/ 13 hour prep prednisone and benadryl    Lipitor [Atorvastatin] Other (See Comments)    MUSCLE CRAMPS   Metformin And Related Diarrhea    Objective:   BP 132/72   Pulse 78   Temp 98.2 F (36.8 C)   Resp 20   Ht 5\' 5"  (1.651 m)   Wt 155 lb (70.3 kg)   BMI 25.79 kg/m    Physical Exam Vitals reviewed.  Constitutional:      General: She is not in acute distress.    Appearance: Normal appearance. She is not ill-appearing, toxic-appearing or diaphoretic.  HENT:     Head: Normocephalic and atraumatic.     Right Ear: Tympanic membrane, ear canal and external ear normal. There is no impacted cerumen.     Left Ear: Tympanic membrane, ear canal and external ear normal. There is no impacted cerumen.  Nose: No congestion or rhinorrhea.     Right Sinus: Frontal sinus tenderness present. No maxillary sinus tenderness.     Left Sinus: Frontal sinus tenderness present. No maxillary sinus tenderness.     Mouth/Throat:     Mouth: Mucous membranes are moist.     Pharynx: Oropharynx is clear. No oropharyngeal exudate or posterior oropharyngeal erythema.  Eyes:     General: No scleral icterus.       Right eye: No discharge.        Left eye: No discharge.     Conjunctiva/sclera: Conjunctivae normal.  Cardiovascular:     Rate and Rhythm: Normal rate and regular rhythm.     Heart sounds: Normal heart sounds. No murmur heard.    No friction rub. No gallop.   Pulmonary:     Effort: Pulmonary effort is normal. No respiratory distress.     Breath sounds: Normal breath sounds. No stridor. No wheezing, rhonchi or rales.  Musculoskeletal:        General: Normal range of motion.     Cervical back: Normal range of motion.  Lymphadenopathy:     Cervical: No cervical adenopathy.  Skin:    General: Skin is warm and dry.     Capillary Refill: Capillary refill takes less than 2 seconds.  Neurological:     General: No focal deficit present.     Mental Status: She is alert and oriented to person, place, and time. Mental status is at baseline.  Psychiatric:        Mood and Affect: Mood normal.        Behavior: Behavior normal.        Thought Content: Thought content normal.        Judgment: Judgment normal.

## 2023-04-30 ENCOUNTER — Other Ambulatory Visit: Payer: Self-pay | Admitting: Genetic Counselor

## 2023-04-30 ENCOUNTER — Other Ambulatory Visit: Payer: Self-pay

## 2023-04-30 ENCOUNTER — Inpatient Hospital Stay: Payer: PPO

## 2023-04-30 ENCOUNTER — Inpatient Hospital Stay: Payer: PPO | Attending: Genetic Counselor | Admitting: Genetic Counselor

## 2023-04-30 DIAGNOSIS — D3610 Benign neoplasm of peripheral nerves and autonomic nervous system, unspecified: Secondary | ICD-10-CM

## 2023-04-30 DIAGNOSIS — D1800 Hemangioma unspecified site: Secondary | ICD-10-CM

## 2023-04-30 LAB — GENETIC SCREENING ORDER

## 2023-05-01 ENCOUNTER — Encounter: Payer: Self-pay | Admitting: Genetic Counselor

## 2023-05-01 NOTE — Progress Notes (Signed)
REFERRING PROVIDER: Lisbeth Renshaw, MD [address] [place],  [place] [address]  PRIMARY PROVIDER:  Myrlene Broker, MD  PRIMARY REASON FOR VISIT:  1. Neurofibroma   2. Cavernous hemangioma      HISTORY OF PRESENT ILLNESS:   Joanne Sanchez, a 67 y.o. female, was seen for a [facility] cancer genetics consultation at the request of Dr. 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  Joanne Sanchez presents to clinic today to discuss the possibility of a hereditary predisposition to cancer, genetic testing, and to further clarify her future cancer risks, as well as potential cancer risks for family members.   Joanne Sanchez is a 67 y.o. female with no personal history of cancer. The patient developed several bumps on her left knee in her 20's-30's, one which was biopsied and found to be a neurofibroma.  More developed on her left hip, then on her left shoulder and now on her right shoulder.  She also reports several areas of shiny de-pigmented areas on her chest. Due to her biopsy proven neurofibroma, Joanne Sanchez was sent to an NF clinic.  The clinic suggested that she be tested for NF1, NF2 and schwannomatosis.  CANCER HISTORY:  Oncology History   No history exists.    Past Medical History:  Diagnosis Date   Allergy    Anemia    Asthma    Cavernous angioma    Environmental allergies    GERD (gastroesophageal reflux disease)    Hernia, hiatal    Hyperlipidemia    Hypertension    Lumbar herniated disc    Pre-diabetes    Recurrent upper respiratory infection (URI)    Vertigo     Past Surgical History:  Procedure Laterality Date   ABDOMINAL HYSTERECTOMY  2000   TAH/BSO   CATARACT EXTRACTION Bilateral    IR ANGIO EXTERNAL CAROTID SEL EXT CAROTID BILAT MOD SED  10/02/2022   IR ANGIO INTRA EXTRACRAN SEL INTERNAL CAROTID BILAT MOD SED  10/02/2022   IR ANGIO VERTEBRAL SEL VERTEBRAL BILAT MOD  SED  10/02/2022   WISDOM TOOTH EXTRACTION  1990's    Social History   Socioeconomic History   Marital status: Married    Spouse name: Not on file   Number of children: Not on file   Years of education: Not on file   Highest education level: Not on file  Occupational History   Occupation: unemployed  Tobacco Use   Smoking status: Never   Smokeless tobacco: Never  Vaping Use   Vaping Use: Never used  Substance and Sexual Activity   Alcohol use: Yes    Alcohol/week: 0.0 standard drinks of alcohol    Comment: occasionally   Drug use: No   Sexual activity: Not on file  Other Topics Concern   Not on file  Social History Narrative   Not on file   Social Determinants of Health   Financial Resource Strain: Not on file  Food Insecurity: Not on file  Transportation Needs: Not on file  Physical Activity: Not on file  Stress: Not on file  Social Connections: Not on file     FAMILY HISTORY:  We obtained a detailed, 4-generation family history.  Significant diagnoses are listed below: Family History  Problem Relation Age of Onset   Heart disease Mother    Hypertension Mother    Hyperlipidemia Mother    Heart disease Father    Hyperlipidemia Father  Hypertension Father    Atrial fibrillation Father    Cancer Brother    Hypertension Brother    Other Brother        benign tumor in neck   Hypertension Brother    Hearing loss Brother        due to tinnitus   Heart disease Maternal Uncle    Lung cancer Maternal Grandmother    Heart attack Paternal Grandfather    Breast cancer Neg Hx      The patient has two daughters.  One daughter was diagnosed with an AD skin condition called Dariers disease.  She has four brothers.  One brother had tinnitus and hearing loss before he died.  Another brother had a benign tumor removed from his neck.  Both parents are deceased.  The patient's mother had a brother with heart disease.  The maternal grandparents are deceased.  The  grandmother had lung cancer.  The patient's father died at 50.  He had learning disabilities and hearing loss.  He had two brothers who were cancer free.  The paternal grandparents are deceased.  Joanne Sanchez is unaware of previous family history of genetic testing for hereditary cancer risks. There is no reported Ashkenazi Jewish ancestry. There is no known consanguinity.  GENETIC COUNSELING ASSESSMENT: Joanne Sanchez is a 67 y.o. female with a personal history of a neurofibroma, cavernous hemangioma and hearing loss which is somewhat suggestive of a hereditary condition and predisposition to cancer such as NF1, NF2 or schwannomatosis. We, therefore, discussed and recommended the following at today's visit.   DISCUSSION: We discussed that, in general, most cancer is not inherited in families, but instead is sporadic or familial. Sporadic cancers occur by chance and typically happen at older ages (>50 years) as this type of cancer is caused by genetic changes acquired during an individual's lifetime. Some families have more cancers than would be expected by chance; however, the ages or types of cancer are not consistent with a known genetic mutation or known genetic mutations have been ruled out. This type of familial cancer is thought to be due to a combination of multiple genetic, environmental, hormonal, and lifestyle factors. While this combination of factors likely increases the risk of cancer, the exact source of this risk is not currently identifiable or testable.  We discussed that NF1 is not typically a cancer syndrome, although individuals can be at a slightly higher risk. NF1 is generally diagnosed clinically through the number of cafe-au-lait spots, the presence of neurofibromas, plexiform neurofibromas and inguinal freckling. NF2 and Schwannomatosis are typically associated with growths on either the auditory nerve or spine. We discussed that testing is beneficial for several reasons including  knowing how to follow individuals and understand if other family members could be at risk for cancer and allow them to undergo genetic testing. We discussed that, despite her result, we will refer her to medical genetics for evaluation in case there are other conditions we need to consider.  We reviewed the characteristics, features and inheritance patterns of hereditary cancer syndromes. We also discussed genetic testing, including the appropriate family members to test, the process of testing, insurance coverage and turn-around-time for results. We discussed the implications of a negative, positive, carrier and/or variant of uncertain significant result. Joanne Sanchez  was offered a common hereditary cancer panel (47 genes) and an expanded pan-cancer panel (77 genes). Joanne Sanchez was informed of the benefits and limitations of each panel, including that expanded pan-cancer panels contain genes that do  not have clear management guidelines at this point in time.  We also discussed that as the number of genes included on a panel increases, the chances of variants of uncertain significance increases. Joanne Sanchez decided to pursue genetic testing for the Multi-cancer gene panel+RNA, as this allows testing for all of the genes associated with NF1, NF2, LZTR1 and SMARCE1.    The Multi-Cancer + RNA Panel offered by [facility] includes sequencing and/or deletion/duplication analysis of the following 70 genes:  AIP*, ALK, APC*, ATM*, AXIN2*, BAP1*, BARD1*, BLM*, BMPR1A*, BRCA1*, BRCA2*, BRIP1*, CDC73*, CDH1*, CDK4, CDKN1B*, CDKN2A, CHEK2*, CTNNA1*, DICER1*, EPCAM (del/dup only), EGFR, FH*, FLCN*, GREM1 (promoter dup only), HOXB13, KIT, LZTR1, MAX*, MBD4, MEN1*, MET, MITF, MLH1*, MSH2*, MSH3*, MSH6*, MUTYH*, NF1*, NF2*, NTHL1*, PALB2*, PDGFRA, PMS2*, POLD1*, POLE*, POT1*, PRKAR1A*, PTCH1*, PTEN*, RAD51C*, RAD51D*, RB1*, RET, SDHA* (sequencing only), SDHAF2*, SDHB*, SDHC*, SDHD*, SMAD4*, SMARCA4*, SMARCB1*, SMARCE1*, STK11*,  SUFU*, TMEM127*, TP53*, TSC1*, TSC2*, VHL*. RNA analysis is performed for * genes.   Based on Joanne Sanchez's personal history of a biopsy proven neurofibroma, she meets medical criteria for genetic testing. Despite that she meets criteria, she may still have an out of pocket cost. We discussed that if her out of pocket cost for testing is over $100, the laboratory will call and confirm whether she wants to proceed with testing.  If the out of pocket cost of testing is less than $100 she will be billed by the genetic testing laboratory.   PLAN: After considering the risks, benefits, and limitations, Joanne Sanchez provided informed consent to pursue genetic testing and the blood sample was sent to [facility] for analysis of the Multi-cancer gene panel + RNA. Results should be available within approximately 2-3 weeks' time, at which point they will be disclosed by telephone to Joanne Sanchez, as will any additional recommendations warranted by these results. Joanne Sanchez will receive a summary of her genetic counseling visit and a copy of her results once available. This information will also be available in Epic.   Lastly, we encouraged Joanne Sanchez to remain in contact with cancer genetics annually so that we can continuously update the family history and inform her of any changes in cancer genetics and testing that may be of benefit for this family.   Joanne Sanchez questions were answered to her satisfaction today. Our contact information was provided should additional questions or concerns arise. Thank you for the referral and allowing [place] to share in the care of your patient.   Keniya Schlotterbeck P. Lowell Guitar, MS, [facility] Licensed, Patent attorney [email or web address] phone: [phone]  The patient was seen for a total of 45 minutes in face-to-face genetic counseling.  The patient was seen alone.  Drs. Meliton Rattan, and/or Baggs were available for questions, if needed..     _______________________________________________________________________ For Office Staff:  Number of people involved in session: 1 Was an Intern/ student involved with case: no

## 2023-05-06 ENCOUNTER — Telehealth: Payer: Self-pay | Admitting: Genetic Counselor

## 2023-05-06 ENCOUNTER — Encounter: Payer: Self-pay | Admitting: Genetic Counselor

## 2023-05-06 DIAGNOSIS — Z1379 Encounter for other screening for genetic and chromosomal anomalies: Secondary | ICD-10-CM | POA: Insufficient documentation

## 2023-05-06 NOTE — Telephone Encounter (Signed)
LM on VM that results are back and to please call.  Left CB instructions. 

## 2023-05-07 NOTE — Telephone Encounter (Signed)
Revealed negative genetic testing.  Discussed that we do not know why she has neurfibromas, however, some individuals can have mosaic NF1 and therefore it may not be reflected in the testing.  It could be due to a different gene that we are not testing, or maybe our current technology may not be able to pick something up.  It will be important for her to keep in contact with genetics to keep up with whether additional testing may be needed.  We will refer her to medical genetics for an evaluation in August.

## 2023-05-08 ENCOUNTER — Ambulatory Visit: Payer: Self-pay | Admitting: Genetic Counselor

## 2023-05-08 DIAGNOSIS — Z1379 Encounter for other screening for genetic and chromosomal anomalies: Secondary | ICD-10-CM

## 2023-05-08 NOTE — Progress Notes (Signed)
HPI:  [name] was previously seen in the [facility] due to a personal history of neurofibromas and concerns regarding a hereditary predisposition to cancer. Please refer to our prior cancer genetics clinic note for more information regarding our discussion, assessment and recommendations, at the time. [name] recent genetic test results were disclosed to her, as were recommendations warranted by these results. These results and recommendations are discussed in more detail below.  CANCER HISTORY:  Oncology History   No history exists.    FAMILY HISTORY:  We obtained a detailed, 4-generation family history.  Significant diagnoses are listed below: Family History  Problem Relation Age of Onset   Heart disease Mother    Hypertension Mother    Hyperlipidemia Mother    Heart disease Father    Hyperlipidemia Father    Hypertension Father    Atrial fibrillation Father    Cancer Brother    Hypertension Brother    Other Brother        benign tumor in neck   Hypertension Brother    Hearing loss Brother        due to tinnitus   Heart disease Maternal Uncle    Lung cancer Maternal Grandmother    Heart attack Paternal Grandfather    Breast cancer Neg Hx        The patient has two daughters.  One daughter was diagnosed with an AD skin condition called Dariers disease.  She has four brothers.  One brother had tinnitus and hearing loss before he died.  Another brother had a benign tumor removed from his neck.  Both parents are deceased.   The patient's mother had a brother with heart disease.  The maternal grandparents are deceased.  The grandmother had lung cancer.   The patient's father died at 71.  He had learning disabilities and hearing loss.  He had two brothers who were cancer free.  The paternal grandparents are deceased.   [name] is unaware of previous family history of genetic testing for hereditary cancer risks. There is no reported Ashkenazi  Jewish ancestry. There is no known consanguinity  GENETIC TEST RESULTS: Genetic testing reported out on May 06, 2023 through the Multi cancer panel + RNA found no pathogenic mutations. The Multi-Cancer + RNA Panel offered by [facility] includes sequencing and/or deletion/duplication analysis of the following 70 genes:  AIP*, ALK, APC*, ATM*, AXIN2*, BAP1*, BARD1*, BLM*, BMPR1A*, BRCA1*, BRCA2*, BRIP1*, CDC73*, CDH1*, CDK4, CDKN1B*, CDKN2A, CHEK2*, CTNNA1*, DICER1*, EPCAM (del/dup only), EGFR, FH*, FLCN*, GREM1 (promoter dup only), HOXB13, KIT, LZTR1, MAX*, MBD4, MEN1*, MET, MITF, MLH1*, MSH2*, MSH3*, MSH6*, MUTYH*, NF1*, NF2*, NTHL1*, PALB2*, PDGFRA, PMS2*, POLD1*, POLE*, POT1*, PRKAR1A*, PTCH1*, PTEN*, RAD51C*, RAD51D*, RB1*, RET, SDHA* (sequencing only), SDHAF2*, SDHB*, SDHC*, SDHD*, SMAD4*, SMARCA4*, SMARCB1*, SMARCE1*, STK11*, SUFU*, TMEM127*, TP53*, TSC1*, TSC2*, VHL*. RNA analysis is performed for * genes. The test report has been scanned into EPIC and is located under the Molecular Pathology section of the Results Review tab.  A portion of the result report is included below for reference.     We discussed with [name] that because current genetic testing is not perfect, it is possible there may be a gene mutation in one of these genes that current testing cannot detect, but that chance is small.  We also discussed, that there could be another gene that has not yet been discovered, or that we have not yet tested, that is responsible for the cancer diagnoses in the family. It  is also possible there is a hereditary cause for the cancer in the family that [name] did not inherit and therefore was not identified in her testing.  Therefore, it is important to remain in touch with cancer genetics in the future so that we can continue to offer [name] the most up to date genetic testing.   ADDITIONAL GENETIC TESTING: We discussed with [name] that her genetic testing was fairly extensive.  If  there are genes identified to increase cancer risk that can be analyzed in the future, we would be happy to discuss and coordinate this testing at that time.    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  Individuals can have mosaic NF1 called segmental NF1, or her neurofibromas could be due to something different.  We will refer her to medical genetics in order to have an evaluation for other conditions that may have resulted in some of the cutaneous or other findings.  CANCER SCREENING RECOMMENDATIONS: [name]'s test result is considered negative (normal).  This means that we have not identified a hereditary cause for her personal history of neurofibromas at this time. Most cancers happen by chance and this negative test suggests that her cancer may fall into this category.    Possible reasons for [name] negative genetic test include:  1. There may be a gene mutation in one of these genes that current testing methods cannot detect but that chance is small.  2. There could be another gene that has not yet been discovered, or that we have not yet tested, that is responsible for the cancer diagnoses in the family.  3.  There may be no hereditary risk for cancer in the family. The cancers in [name] and/or her family may be sporadic/familial or due to other genetic and environmental factors. 4. It is also possible there is a hereditary cause for the cancer in the family that [name] did not inherit.  Therefore, it is recommended she continue to follow the cancer management and screening guidelines provided by her primary healthcare provider. An individual's cancer risk and medical management are not determined by genetic test results alone. Overall cancer risk assessment incorporates additional factors, including personal medical history, family history, and any available genetic information that may result in a personalized plan for cancer  prevention and surveillance  RECOMMENDATIONS FOR FAMILY MEMBERS:  Individuals in this family might be at some increased risk of developing cancer, over the general population risk, simply due to the family history of cancer.  We recommended women in this family have a yearly mammogram beginning at age 33, or 64 years younger than the earliest onset of cancer, an annual clinical breast exam, and perform monthly breast self-exams. Women in this family should also have a gynecological exam as recommended by their primary provider. All family members should be referred for colonoscopy starting at age 70.  FOLLOW-UP: Lastly, we discussed with [name] that cancer genetics is a rapidly advancing field and it is possible that new genetic tests will be appropriate for her and/or her family members in the future. We encouraged her to remain in contact with cancer genetics on an annual basis so we can update her personal and family histories and let her know of advances in cancer genetics that may benefit this family.   Our contact number was provided. [name] questions were answered to her satisfaction, and she knows she is welcome to  call us at anytime with additional questions or concerns.   Maylon Cos, MS, [place] Licensed, Certified Genetic Counselor [email or web address]

## 2023-05-10 ENCOUNTER — Telehealth: Payer: Self-pay | Admitting: Internal Medicine

## 2023-05-10 ENCOUNTER — Telehealth: Payer: Self-pay | Admitting: Urgent Care

## 2023-05-10 DIAGNOSIS — B379 Candidiasis, unspecified: Secondary | ICD-10-CM

## 2023-05-10 MED ORDER — FLUCONAZOLE 150 MG PO TABS: 150.0000 mg | ORAL_TABLET | Freq: Every day | ORAL | 0 refills | Status: DC

## 2023-05-10 NOTE — Telephone Encounter (Signed)
Patient called and said she has a yeast infection and asked if her provider can send something in for it. She declined scheduling an appointment. Patient was informed she would need an appointment to get something prescribed to her. Best callback is [phone].

## 2023-05-10 NOTE — Telephone Encounter (Signed)
Pt was seen on 6.24

## 2023-05-10 NOTE — Telephone Encounter (Signed)
Pt prescribed Augmentin on 04/29/23 due to sinusitis. Pt now has s/sx of yeast infection. Single tablet of 150mg  diflucan called in.

## 2023-05-15 NOTE — Telephone Encounter (Signed)
Called pt and informed her that we have sent in her prescription and she has any other questions or concerns to give our office a call back

## 2023-05-16 ENCOUNTER — Ambulatory Visit (INDEPENDENT_AMBULATORY_CARE_PROVIDER_SITE_OTHER): Payer: PPO | Admitting: *Deleted

## 2023-05-16 DIAGNOSIS — J309 Allergic rhinitis, unspecified: Secondary | ICD-10-CM

## 2023-05-28 ENCOUNTER — Encounter: Payer: Self-pay | Admitting: Internal Medicine

## 2023-05-28 ENCOUNTER — Ambulatory Visit: Payer: PPO

## 2023-05-28 ENCOUNTER — Ambulatory Visit (INDEPENDENT_AMBULATORY_CARE_PROVIDER_SITE_OTHER): Payer: PPO | Admitting: Internal Medicine

## 2023-05-28 VITALS — BP 160/80 | HR 79 | Temp 98.0°F | Ht 65.0 in | Wt 155.0 lb

## 2023-05-28 DIAGNOSIS — Z1379 Encounter for other screening for genetic and chromosomal anomalies: Secondary | ICD-10-CM

## 2023-05-28 DIAGNOSIS — Z23 Encounter for immunization: Secondary | ICD-10-CM

## 2023-05-28 DIAGNOSIS — D3610 Benign neoplasm of peripheral nerves and autonomic nervous system, unspecified: Secondary | ICD-10-CM | POA: Diagnosis not present

## 2023-05-28 NOTE — Progress Notes (Signed)
This encounter was created in error - please disregard.

## 2023-05-28 NOTE — Progress Notes (Signed)
   Subjective:   Patient ID: Joanne Sanchez, female    DOB: 1956-03-12, 67 y.o.   MRN: [id number]  HPI The patient is a 67 YO female coming in for questions about her genetic screening.   Review of Systems  Constitutional: Negative.   HENT: Negative.    Eyes: Negative.   Respiratory:  Negative for cough, chest tightness and shortness of breath.   Cardiovascular:  Negative for chest pain, palpitations and leg swelling.  Gastrointestinal:  Negative for abdominal distention, abdominal pain, constipation, diarrhea, nausea and vomiting.  Musculoskeletal: Negative.   Skin: Negative.   Neurological: Negative.   Psychiatric/Behavioral: Negative.      Objective:  Physical Exam Constitutional:      Appearance: She is well-developed.  HENT:     Head: Normocephalic and atraumatic.  Cardiovascular:     Rate and Rhythm: Normal rate and regular rhythm.  Pulmonary:     Effort: Pulmonary effort is normal. No respiratory distress.     Breath sounds: Normal breath sounds. No wheezing or rales.  Abdominal:     General: Bowel sounds are normal. There is no distension.     Palpations: Abdomen is soft.     Tenderness: There is no abdominal tenderness. There is no rebound.  Musculoskeletal:     Cervical back: Normal range of motion.  Skin:    General: Skin is warm and dry.  Neurological:     Mental Status: She is alert and oriented to person, place, and time.     Coordination: Coordination normal.     Vitals:   05/28/23 1437 05/28/23 1439  BP: (!) 160/80 (!) 160/80  Pulse: 79   Temp: 98 F (36.7 C)   TempSrc: Oral   SpO2: 96%   Weight: 155 lb (70.3 kg)   Height: 5\' 5"  (1.651 m)    Visit time 20 minutes in face to face communication with patient and coordination of care, additional 10 minutes spent in record review, coordination or care, ordering tests, communicating/referring to other healthcare professionals, documenting in medical records all on the same day of the visit for total  time 30 minutes spent on the visit.   Assessment & Plan:  Prevnar 20 given at visit

## 2023-05-31 NOTE — Assessment & Plan Note (Signed)
She had further questions about genetic screening which were answered. She did not test positive for any of the cancer genes which were tested. We discussed she does not likely need modified schedule for cancer screening based on these results.

## 2023-05-31 NOTE — Assessment & Plan Note (Signed)
Recent genetic screening not conclusive. 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 We discussed any needed change in cancer screening which there should not be change needed at current time. She has been screened with brain imaging for schwanoma. Due for prevnar 20 today which is given.

## 2023-06-11 ENCOUNTER — Ambulatory Visit (INDEPENDENT_AMBULATORY_CARE_PROVIDER_SITE_OTHER): Payer: PPO

## 2023-06-11 DIAGNOSIS — J309 Allergic rhinitis, unspecified: Secondary | ICD-10-CM | POA: Diagnosis not present

## 2023-06-26 ENCOUNTER — Telehealth: Payer: Self-pay

## 2023-06-26 ENCOUNTER — Ambulatory Visit (INDEPENDENT_AMBULATORY_CARE_PROVIDER_SITE_OTHER): Payer: PPO

## 2023-06-26 VITALS — Ht 65.0 in | Wt 153.0 lb

## 2023-06-26 DIAGNOSIS — Z0000 Encounter for general adult medical examination without abnormal findings: Secondary | ICD-10-CM

## 2023-06-26 NOTE — Patient Instructions (Signed)
Joanne Sanchez , Thank you for taking time to come for your Medicare Wellness Visit. I appreciate your ongoing commitment to your health goals. Please review the following plan we discussed and let me know if I can assist you in the future.   Referrals/Orders/Follow-Ups/Clinician Recommendations: Yes; patient inquiring about starting medication for elevated cholesterol.  This is a list of the screening recommended for you and due dates:  Health Maintenance  Topic Date Due   Hepatitis C Screening  Never done   Zoster (Shingles) Vaccine (1 of 2) Never done   COVID-19 Vaccine (6 - 2023-24 season) 07/06/2022   DTaP/Tdap/Td vaccine (2 - Td or Tdap) 05/28/2023   Flu Shot  06/06/2023   Medicare Annual Wellness Visit  06/25/2024   Mammogram  01/03/2025   Colon Cancer Screening  03/21/2025   Pneumonia Vaccine  Completed   DEXA scan (bone density measurement)  Completed   HPV Vaccine  Aged Out    Advanced directives: (Copy Requested) Please bring a copy of your health care power of attorney and living will to the office to be added to your chart at your convenience.  Next Medicare Annual Wellness Visit scheduled for next year: Yes  Preventive Care attachment FALL PREVENTION attachment if needed

## 2023-06-26 NOTE — Progress Notes (Signed)
Subjective:   Joanne Sanchez is a 67 y.o. female who presents for Medicare Annual (Subsequent) preventive examination.  Visit Complete: Virtual  I connected with  Winfield Rast on 06/26/23 by a audio enabled telemedicine application and verified that I am speaking with the correct person using two identifiers.  Patient Location: Home  Provider Location: Office/Clinic  I discussed the limitations of evaluation and management by telemedicine. The patient expressed understanding and agreed to proceed.  Vital Signs: Because this visit was a virtual/telehealth visit, some criteria may be missing or patient reported. Any vitals not documented were not able to be obtained and vitals that have been documented are patient reported.    Review of Systems     Cardiac Risk Factors include: advanced age (>71men, >70 women);dyslipidemia;hypertension;family history of premature cardiovascular disease     Objective:    Today's Vitals   06/26/23 1531  Weight: 153 lb (69.4 kg)  Height: 5\' 5"  (1.651 m)  PainSc: 0-No pain   Body mass index is 25.46 kg/m.     06/26/2023    3:45 PM 10/02/2022    9:04 AM 01/21/2018   10:14 PM  Advanced Directives  Does Patient Have a Medical Advance Directive? Yes Yes No  Type of Estate agent of [place];Living will Living will   Does patient want to make changes to medical advance directive?  No - Patient declined   Copy of Healthcare Power of Attorney in Chart? No - copy requested      Current Medications (verified) Outpatient Encounter Medications as of 06/26/2023  Medication Sig   amLODipine (NORVASC) 5 MG tablet Take 5 mg by mouth daily.   B Complex-C (SUPER B COMPLEX/VITAMIN C) TABS 1 tablet   Cholecalciferol (VITAMIN D-3) 5000 UNITS TABS Take 5,000 Units by mouth daily.   cycloSPORINE (RESTASIS) 0.05 % ophthalmic emulsion 1 drop into affected eye   famotidine (PEPCID) 40 MG tablet Take 1 tablet (40 mg total) by mouth at  bedtime.   fluconazole (DIFLUCAN) 150 MG tablet Take 1 tablet (150 mg total) by mouth daily.   fluticasone (FLONASE) 50 MCG/ACT nasal spray Place 2 sprays into both nostrils daily.   MAGNESIUM CHLORIDE-CALCIUM PO Take by mouth daily. Reported on 05/04/2016   Omega-3 Fatty Acids (FISH OIL) 1000 MG CAPS 1 capsule   omeprazole (PRILOSEC) 40 MG capsule Take 1 capsule (40 mg total) by mouth in the morning.   rosuvastatin (CRESTOR) 5 MG tablet 1 tablet   Varenicline Tartrate (TYRVAYA NA) Place into the nose in the morning and at bedtime.   No facility-administered encounter medications on file as of 06/26/2023.    Allergies (verified) Iodinated contrast media, Lipitor [atorvastatin], and Metformin and related   History: Past Medical History:  Diagnosis Date   Allergy    Anemia    Asthma    Cavernous angioma    Environmental allergies    GERD (gastroesophageal reflux disease)    Hernia, hiatal    Hyperlipidemia    Hypertension    Lumbar herniated disc    Pre-diabetes    Recurrent upper respiratory infection (URI)    Vertigo    Past Surgical History:  Procedure Laterality Date   ABDOMINAL HYSTERECTOMY  2000   TAH/BSO   CATARACT EXTRACTION Bilateral    IR ANGIO EXTERNAL CAROTID SEL EXT CAROTID BILAT MOD SED  10/02/2022   IR ANGIO INTRA EXTRACRAN SEL INTERNAL CAROTID BILAT MOD SED  10/02/2022   IR ANGIO VERTEBRAL SEL VERTEBRAL BILAT MOD  SED  10/02/2022   WISDOM TOOTH EXTRACTION  1990's   Family History  Problem Relation Age of Onset   Heart disease Mother    Hypertension Mother    Hyperlipidemia Mother    Heart disease Father    Hyperlipidemia Father    Hypertension Father    Atrial fibrillation Father    Cancer Brother    Hypertension Brother    Other Brother        benign tumor in neck   Hypertension Brother    Hearing loss Brother        due to tinnitus   Heart disease Maternal Uncle    Lung cancer Maternal Grandmother    Heart attack Paternal Grandfather    Breast  cancer Neg Hx    Social History   Socioeconomic History   Marital status: Married    Spouse name: Not on file   Number of children: Not on file   Years of education: Not on file   Highest education level: Not on file  Occupational History   Occupation: unemployed  Tobacco Use   Smoking status: Never   Smokeless tobacco: Never  Vaping Use   Vaping status: Never Used  Substance and Sexual Activity   Alcohol use: Yes    Alcohol/week: 0.0 standard drinks of alcohol    Comment: occasionally   Drug use: No   Sexual activity: Not on file  Other Topics Concern   Not on file  Social History Narrative   Not on file   Social Determinants of Health   Financial Resource Strain: Low Risk  (06/26/2023)   Overall Financial Resource Strain (CARDIA)    Difficulty of Paying Living Expenses: Not hard at all  Food Insecurity: No Food Insecurity (06/26/2023)   Hunger Vital Sign    Worried About Running Out of Food in the Last Year: Never true    Ran Out of Food in the Last Year: Never true  Transportation Needs: No Transportation Needs (06/26/2023)   PRAPARE - Administrator, Civil Service (Medical): No    Lack of Transportation (Non-Medical): No  Physical Activity: Sufficiently Active (06/26/2023)   Exercise Vital Sign    Days of Exercise per Week: 5 days    Minutes of Exercise per Session: 30 min  Stress: No Stress Concern Present (06/26/2023)   [employer] of Occupational Health - Occupational Stress Questionnaire    Feeling of Stress : Not at all  Social Connections: Socially Integrated (06/26/2023)   Social Connection and Isolation Panel [NHANES]    Frequency of Communication with Friends and Family: More than three times a week    Frequency of Social Gatherings with Friends and Family: More than three times a week    Attends Religious Services: More than 4 times per year    Active Member of [employer] or Organizations: Yes    Attends Engineer, structural: More  than 4 times per year    Marital Status: Married    Tobacco Counseling Counseling given: Not Answered   Clinical Intake:  Pre-visit preparation completed: Yes  Pain : No/denies pain Pain Score: 0-No pain     BMI - recorded: 25.46 Nutritional Status: BMI 25 -29 Overweight Nutritional Risks: None Diabetes: No  How often do you need to have someone help you when you read instructions, pamphlets, or other written materials from your doctor or pharmacy?: 1 - Never What is the last grade level you completed in school?: [employer] L[employer]  Interpreter Needed?: No  Information entered by :: Lecretia Buczek N. Chauntelle Azpeitia, LPN.   Activities of Daily Living    06/26/2023    3:43 PM  In your present state of health, do you have any difficulty performing the following activities:  Hearing? 1  Vision? 0  Difficulty concentrating or making decisions? 0  Walking or climbing stairs? 0  Dressing or bathing? 0  Doing errands, shopping? 0  Preparing Food and eating ? N  Using the Toilet? N  In the past six months, have you accidently leaked urine? N  Do you have problems with loss of bowel control? N  Managing your Medications? N  Managing your Finances? N  Housekeeping or managing your Housekeeping? N    Patient Care Team: Myrlene Broker, MD as PCP - General (Internal Medicine) Elpidio Galea, MD as Consulting Physician (Ophthalmology) Marzella Schlein., MD as Consulting Physician (Ophthalmology)  Indicate any recent Medical Services you may have received from other than [facility] providers in the past year (date may be approximate).     Assessment:   This is a routine wellness examination for Sahib.  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  Patient wears hearing aids in both ears. Provider: [facility], 2017 initial evaluation. Vision Screening - Comments:: Patient does wear corrective  lenses/contacts.  Annual eye exam done by: Elpidio Galea, MD. and Velna Ochs, MD.   Dietary issues and exercise activities discussed:     Goals Addressed             This Visit's Progress    Client understands the importance of follow-up with providers by attending scheduled visits        Depression Screen    05/28/2023    2:39 PM 04/29/2023    4:05 PM 03/04/2023    8:25 AM  PHQ 2/9 Scores  PHQ - 2 Score 0 0 0  PHQ- 9 Score 0      Fall Risk    06/26/2023    3:43 PM 05/28/2023    2:39 PM 03/04/2023    8:25 AM  Fall Risk   Falls in the past year? 0 0 0  Number falls in past yr: 0 0 0  Injury with Fall? 0 0 0  Risk for fall due to : No Fall Risks  No Fall Risks  Follow up Falls prevention discussed Falls evaluation completed Falls evaluation completed    MEDICARE RISK AT HOME: Medicare Risk at Home Any stairs in or around the home?: No If so, are there any without handrails?: No Home free of loose throw rugs in walkways, pet beds, electrical cords, etc?: Yes Adequate lighting in your home to reduce risk of falls?: Yes Life alert?: No Use of a cane, walker or w/c?: No Grab bars in the bathroom?: Yes Shower chair or bench in shower?: Yes (teak stool) Elevated toilet seat or a handicapped toilet?: Yes  TIMED UP AND GO:  Was the test performed?  No    Cognitive Function:        06/26/2023    3:43 PM  6CIT Screen  What Year? 0 points  What month? 0 points  What time? 0 points  Count back from 20 0 points  Months in reverse 0 points  Repeat phrase 0 points  Total Score 0 points    Immunizations Immunization History  Administered Date(s) Administered   Influenza Split 07/28/2012, 09/23/2013, 09/05/2016   Influenza,inj,Quad  PF,6+ Mos 09/16/2017, 10/31/2018, 08/23/2020   Influenza,inj,quad, With Preservative 10/31/2018, 07/21/2019   Moderna Sars-Covid-2 Vaccination 11/17/2019   PFIZER(Purple Top)SARS-COV-2 Vaccination 12/28/2019, 01/18/2020, 09/13/2020,  07/26/2021   PNEUMOCOCCAL CONJUGATE-20 05/28/2023   Pneumococcal Polysaccharide-23 11/05/2000   Tdap 05/27/2013    TDAP status: Due, Education has been provided regarding the importance of this vaccine. Advised may receive this vaccine at local pharmacy or Health Dept. Aware to provide a copy of the vaccination record if obtained from local pharmacy or Health Dept. Verbalized acceptance and understanding.  Flu Vaccine status: Up to date  Pneumococcal vaccine status: Up to date  Covid-19 vaccine status: Completed vaccines  Qualifies for Shingles Vaccine? Yes   Zostavax completed No   Shingrix Completed?: Yes  Screening Tests Health Maintenance  Topic Date Due   Hepatitis C Screening  Never done   Zoster Vaccines- Shingrix (1 of 2) Never done   COVID-19 Vaccine (6 - 2023-24 season) 07/06/2022   DTaP/Tdap/Td (2 - Td or Tdap) 05/28/2023   INFLUENZA VACCINE  06/06/2023   Medicare Annual Wellness (AWV)  06/25/2024   MAMMOGRAM  01/03/2025   Colonoscopy  03/21/2025   Pneumonia Vaccine 56+ Years old  Completed   DEXA SCAN  Completed   HPV VACCINES  Aged Out    Health Maintenance  Health Maintenance Due  Topic Date Due   Hepatitis C Screening  Never done   Zoster Vaccines- Shingrix (1 of 2) Never done   COVID-19 Vaccine (6 - 2023-24 season) 07/06/2022   DTaP/Tdap/Td (2 - Td or Tdap) 05/28/2023   INFLUENZA VACCINE  06/06/2023    Colorectal cancer screening: Type of screening: Colonoscopy. Completed 03/21/2022. Repeat every 3 years  Mammogram status: Completed 01/04/2023. Repeat every year  Bone Density status: Completed 06/27/2022. Results reflect: Bone density results: OSTEOPENIA. Repeat every 2-3 years.  Lung Cancer Screening: (Low Dose CT Chest recommended if Age 68-80 years, 20 pack-year currently smoking OR have quit w/in 15years.) does not qualify.   Lung Cancer Screening Referral: no  Additional Screening:  Hepatitis C Screening: does qualify; Completed: no  Vision  Screening: Recommended annual ophthalmology exams for early detection of glaucoma and other disorders of the eye. Is the patient up to date with their annual eye exam?  Yes  Who is the provider or what is the name of the office in which the patient attends annual eye exams? Elpidio Galea, MD at [facility] If pt is not established with a provider, would they like to be referred to a provider to establish care? No .   Dental Screening: Recommended annual dental exams for proper oral hygiene  Diabetic Foot Exam: N/A  Community Resource Referral / Chronic Care Management: CRR required this visit?  No   CCM required this visit?  No     Plan:     I have personally reviewed and noted the following in the patient's chart:   Medical and social history Use of alcohol, tobacco or illicit drugs  Current medications and supplements including opioid prescriptions. Patient is not currently taking opioid prescriptions. Functional ability and status Nutritional status Physical activity Advanced directives List of other physicians Hospitalizations, surgeries, and ER visits in previous 12 months Vitals Screenings to include cognitive, depression, and falls Referrals and appointments  In addition, I have reviewed and discussed with patient certain preventive protocols, quality metrics, and best practice recommendations. A written personalized care plan for preventive services as well as general preventive health recommendations were provided to patient.  Mickeal Needy, LPN   04/25/3085   After Visit Summary: (Mail) Due to this being a telephonic visit, the after visit summary with patients personalized plan was offered to patient via mail   Nurse Notes: Normal cognitive status assessed by direct observation via telephone conversation by this Nurse Health Advisor. No abnormalities found.

## 2023-06-26 NOTE — Telephone Encounter (Signed)
Patient completed AWV today and had a lot of concerns she feels that need to be addressed.  Patient has been dx with Neurofibromatosis and had had genetic testing done 04/30/2023 at [facility].  Patient would like to ask Dr. Okey Dupre if she would do a baseline EKG (because she has not had one in awhile and plus she has no cardiologist) and also with her last bloodwork showing her cholesterol being elevated, she wants to inquire if she should start on a statin.

## 2023-06-27 NOTE — Telephone Encounter (Signed)
We have already discussed her genetic evaluation so I do not have anything further to add on that at her visit end of July. Happy to see her for visit with EKG and discussion about statin.

## 2023-06-28 NOTE — Telephone Encounter (Signed)
Called and lvm

## 2023-07-01 NOTE — Progress Notes (Signed)
Depression screening completed 06/26/2023 by the nurse.  No issues with depression.  Kashaun Bebo N. Cariah Salatino, LPN. [facility] AWV Team Direct Dial: [phone]

## 2023-07-02 NOTE — Telephone Encounter (Signed)
2nd attempt LVM

## 2023-07-04 NOTE — Telephone Encounter (Signed)
3rd attempt

## 2023-07-05 ENCOUNTER — Telehealth: Payer: Self-pay | Admitting: Medical Genetics

## 2023-07-05 NOTE — Telephone Encounter (Signed)
Called patient and left message for patient to call back to schedule an appointment.

## 2023-07-18 ENCOUNTER — Ambulatory Visit (INDEPENDENT_AMBULATORY_CARE_PROVIDER_SITE_OTHER): Payer: PPO

## 2023-07-18 DIAGNOSIS — J309 Allergic rhinitis, unspecified: Secondary | ICD-10-CM

## 2023-07-24 ENCOUNTER — Ambulatory Visit: Payer: PPO | Admitting: Internal Medicine

## 2023-07-25 ENCOUNTER — Ambulatory Visit (INDEPENDENT_AMBULATORY_CARE_PROVIDER_SITE_OTHER): Payer: PPO | Admitting: Internal Medicine

## 2023-07-25 ENCOUNTER — Encounter: Payer: Self-pay | Admitting: Internal Medicine

## 2023-07-25 VITALS — BP 146/82 | HR 67 | Temp 97.7°F | Ht 65.0 in | Wt 159.0 lb

## 2023-07-25 DIAGNOSIS — E782 Mixed hyperlipidemia: Secondary | ICD-10-CM

## 2023-07-25 NOTE — Assessment & Plan Note (Signed)
Checking EKG to update. Ordered CT calcium score and will adjust treatment as needed. Stop crestor due to side effects.

## 2023-07-25 NOTE — Progress Notes (Signed)
Subjective:   Patient ID: Joanne Sanchez, female    DOB: [birth year], 67 y.o.   MRN: [id number]  HPI The patient is a 67 YO female coming in for follow up cholesterol and concerns about same. Getting side effects of bowel problems and muscle pain from crestor even taking every other day 5 mg.   Review of Systems  Constitutional: Negative.   HENT: Negative.    Eyes: Negative.   Respiratory:  Negative for cough, chest tightness and shortness of breath.   Cardiovascular:  Negative for chest pain, palpitations and leg swelling.  Gastrointestinal:  Negative for abdominal distention, abdominal pain, constipation, diarrhea, nausea and vomiting.  Musculoskeletal: Negative.   Skin: Negative.   Neurological: Negative.   Psychiatric/Behavioral: Negative.      Objective:  Physical Exam Constitutional:      Appearance: She is well-developed.  HENT:     Head: Normocephalic and atraumatic.  Cardiovascular:     Rate and Rhythm: Normal rate and regular rhythm.  Pulmonary:     Effort: Pulmonary effort is normal. No respiratory distress.     Breath sounds: Normal breath sounds. No wheezing or rales.  Abdominal:     General: Bowel sounds are normal. There is no distension.     Palpations: Abdomen is soft.     Tenderness: There is no abdominal tenderness. There is no rebound.  Musculoskeletal:     Cervical back: Normal range of motion.  Skin:    General: Skin is warm and dry.  Neurological:     Mental Status: She is alert and oriented to person, place, and time.     Coordination: Coordination normal.     Vitals:   07/25/23 0843 07/25/23 0845  BP: (!) 146/82 (!) 146/82  Pulse: 67   Temp: 97.7 F (36.5 C)   TempSrc: Oral   SpO2: 97%   Weight: 159 lb (72.1 kg)   Height: 5\' 5"  (1.651 m)    EKG: Rate 57, axis normal, interval normal, sinus brady, no st or t wave changes, no significant change compared to prior 2010  Assessment & Plan:

## 2023-07-25 NOTE — Patient Instructions (Signed)
Your EKG is normal and we will get the calcium score to check the heart

## 2023-07-29 ENCOUNTER — Telehealth: Payer: Self-pay

## 2023-07-29 DIAGNOSIS — Z1159 Encounter for screening for other viral diseases: Secondary | ICD-10-CM

## 2023-07-29 NOTE — Telephone Encounter (Signed)
Yes Hep C screening

## 2023-07-29 NOTE — Telephone Encounter (Signed)
Hep c is a blood test to screen for hep c disease. There is not a hep c vaccine. Does she want screening?

## 2023-07-30 NOTE — Telephone Encounter (Signed)
Called and pt an VS

## 2023-07-30 NOTE — Telephone Encounter (Signed)
Placed.

## 2023-08-02 ENCOUNTER — Ambulatory Visit (HOSPITAL_COMMUNITY)
Admission: RE | Admit: 2023-08-02 | Discharge: 2023-08-02 | Disposition: A | Discharge status: Home / Self Care | Payer: Self-pay | Source: Ambulatory Visit | Attending: Internal Medicine | Admitting: Internal Medicine

## 2023-08-02 DIAGNOSIS — E782 Mixed hyperlipidemia: Secondary | ICD-10-CM | POA: Insufficient documentation

## 2023-08-07 ENCOUNTER — Ambulatory Visit (INDEPENDENT_AMBULATORY_CARE_PROVIDER_SITE_OTHER): Payer: PPO | Admitting: Medical Genetics

## 2023-08-07 ENCOUNTER — Encounter: Payer: Self-pay | Admitting: Medical Genetics

## 2023-08-07 VITALS — BP 139/62 | HR 67 | Ht 65.5 in | Wt 159.5 lb

## 2023-08-07 DIAGNOSIS — D3610 Benign neoplasm of peripheral nerves and autonomic nervous system, unspecified: Secondary | ICD-10-CM | POA: Diagnosis not present

## 2023-08-07 DIAGNOSIS — D1800 Hemangioma unspecified site: Secondary | ICD-10-CM | POA: Diagnosis not present

## 2023-08-07 NOTE — Progress Notes (Signed)
GENETIC COUNSELING NEW PATIENT EVALUATION Patient name: Joanne Sanchez DOB: 1956-09-01 Age: 67 y.o. MRN: [id number]  Referring Provider/Specialty: Maylon Cos, MS [facility] Date of Evaluation: 08/07/2023 Chief Complaint/Reason for Referral: Neurofibroma, cavernoma   Brief Summary: Joanne Sanchez is a 67 y.o. 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 Joanne Sanchez also reports a personal history of sensorineural hearing loss and cataracts with onset in her 64s. She is unaccompanied at today's visit.  Prior genetic testing has been performed.  Joanne Sanchez saw Maylon Cos at [facility] on June 25th, 2024 for testing based on her personal history of neurofibromas. At this appointment, the [facility] Multi-Cancer + RNA Panel which was Negative/Normal.  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   Family History: See pedigree obtained during Joanne Sanchez' previous visit with Cancer Genetics in the note dated 04/30/2023.  The family history was notable for the following: Daughter, 29 yo, with autosomal dominant Darier's disease. Daughter, 21 yo, with large birthmark on hip. Brother, with an unknown tumor in his neck. Brother, deceased, with tinnitis and hearing loss in his 52s.  Paternal Family History Father, deceased at 18 yo, with hearing loss in older age.  Maternal Family History Mother, deceased at 42 yo, with heart disease. Uncle, deceased, with heart disease. Grandmother, deceased in her 20s, with lung cancer.  Consangunity: Denies   Prior Genetic testing: [facility] Multi-Cancer with RNA Panel, resulted negative/normal. Results available under Notes dated 05/07/2023.  Genetic Counseling: Joanne Sanchez is a 67 y.o. female with a personal  history of at least one biopsy-proven neurofibroma, one cavernous malformation, sensorineural hearing loss, tinnitus, and cataracts.  Specifically, Joanne Sanchez reported that she began developing several lumps on her left knee in her 30s, in her 66s, she developed these same lumps on her left hip, and in her 41s she developed them on her left and right shoulders.  In 2023, one of the lumps on her knee was biopsied and found to be a neurofibroma. See note from Dr. [place] Haldeman Sanchez from this encounter for the NF1-related physical exam.  Joanne Sanchez was not found to have other symptoms of NF1.  Her genetic testing was also negative for pathogenic variants in the NF1 and NF2 genes.  It was discussed that it is possible Joanne Sanchez possesses a variant in the NF1 gene localized to the regions in which she has developed the lumps, called somatic mosaicism.  In this case, traditional genetic testing techniques would result negative despite these specific tissues being affected.  Additionally, during a work-up for sensorineural hearing loss in 2022, Joanne Sanchez had a brain MRI that identified two small cavernous malformations.  She also reports tinnitus and cataracts beginning in her 42s.  Genetic considerations were reviewed with Joanne Sanchez. She is aware that we have over 20,000 genes, each with an important role in the body. All of the genes are packaged into structures called chromosomes. We have two copies of every chromosome- one that is inherited from each parent- and thus two copies of every gene. Given Joanne Sanchez' features, concern for a genetic cause of her symptoms has arisen. If a specific genetic abnormality can be identified, it may help provide further insight into prognosis, management, and recurrence risk.  At this time, there is no specific  genetic diagnosis evident in Ms. Dolph. Given her complicated medical history, a broad approach to genetic testing is recommended. Specifically, we  recommend whole exome sequencing (WES). Whole exome sequencing assesses all of the coding regions (exons) of the genes for any variants that could be associated with an individual's symptoms.  Joanne Sanchez is interested in pursuing this testing today, with secondary findings included, depending on the cost associated. We reviewed the consent form including possible results, (positive, negative, variant of uncertain significance), expected timeline, cost, and legal protections including the Genetic Information Non-Discrimination Act (GINA) and obtained verbal and written consent.  The test will be ordered from [facility] with a Benefits Investigation to occur before test processing to estimate the out-of-pocket cost.  Once this information is available, we will contact Ms. Malerba to determine whether she would like to move forward with testing or cancel at this time. Buccal samples were obtained.  Recommendations: Whole exome sequencing (WES) from [facility] with a preliminary Benefits Investigation. Will follow-up with final decision regarding testing. Continue medical care with current providers as recommended.  Date: 08/07/2023 Time: 14:36 Total time spent: 60  Lambert Mody MS [facility] Certified Genetic Counselor [facility]  I have personally counseled the patient/family, spending > 50% of total time on counseling and coordination of care as outlined.

## 2023-08-07 NOTE — Progress Notes (Signed)
MEDICAL GENETICS NEW PATIENT EVALUATION  Patient name: Joanne Sanchez DOB: 1956-10-15 Age: 67 y.o. MRN: [id number]  Referring Provider/Specialty: Hillard Danker, MD  Date of Evaluation: 08/07/2023 Chief Complaint/Reason for Referral: Neurofibromas, cavernoma  Assessment: We discussed with Joanne Sanchez that she does not meet the criteria for a clinical diagnosis of neurofibromatosis type 1 (NF1). The neurofibromas present are therefore most likely related to somatic changes of the NF1 gene (or other gene) in the affected areas. Joanne Sanchez has several other medical conditions, including her hearing loss, cataracts, and cavernoma, which do not appear to have a unifying genetic diagnosis. This suggests more than one underlying genetic condition may be present. Appropriate testing to consider at this time would be exome sequencing, which analyzes thousands of genes simultaneously for sequence and copy number changes. Joanne Sanchez was interested in this testing being performed, and consent and buccal swabs were obtained for an exome sequencing study through [facility]. A benefits investigation will be completed intially to determine insurance coverage, and we will follow up with Joanne Sanchez with this information when available.  Recommendations: Exome sequencing through [facility] - will perform a benefits investigation initially to determine insurance coverage. Will follow up with Joanne Sanchez with this information and proceed based on her preference. Continue care with current medical providers per their recommendations.  Follow up will be based on the results of her benefits investigation and testing.   HPI: Joanne Sanchez is a 67 y.o. assigned female at birth who presents today for an initial genetics evaluation for multiple medical concerns. She provided the history today. This information, along with a review of pertinent records, labs, and radiology studies, is summarized below.  Joanne Sanchez  developed several small lumps in the skin on her left knee in her 30s. She developed additional lesions on her left hip in her 44s, on her left shoulder in her 45s, on her right shoulder in her 38s, and a few on her left chin within the last few years. These were not biopsied until recently (09/2022). 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 She feels she had a birthmark on her skin when she was younger but denies any of these currently. She has freckles on her skin but not in her axilla or inguinal regions. She has had MRIs of her brain that did not identify an optic glioma. Joanne Sanchez also saw eye specialists but no Lisch nodules were noted on exam. She had a tibial fracture about 3-4 years ago, but no congenital tibial bowing. There is no family history of neurofibromatosis type 1 (NF1). In 05/2023, Joanne Sanchez had a genetic counseling visit, and a cancer gene panel that included DNA and RNA testing of the NF1 gene that was negative.   Joanne Sanchez developed sensorineural hearing loss of unclear cause since around age 57. After her neurofibroma was identified, she had brian MRI/CT scans that showed two small cavernomas. She has unilateral pulsatile tinnitus, and a cerebral angiogram was normal. There are no plans for any management of her cavernomas.  Additional medical concerns include: - Ophtho: cataracts starting in 57s s/p surgery, dry eyes - ENT: sensorineural hearing loss - wears hearing aids, history of broken nose at 67yo that caused a deviated septum - Pulm: recurrent bronchitis/pneumonia due to allergies - GI: hiatal hernia and GERD - Ortho: muscle spasms when sleeping  Past Medical History: Past Medical History:  Diagnosis Date   Allergy    Anemia  Asthma    Cavernous angioma    Environmental allergies    GERD (gastroesophageal reflux disease)    Hernia, hiatal    Hyperlipidemia    Hypertension    Lumbar herniated disc    Pre-diabetes     Recurrent upper respiratory infection (URI)    Vertigo    Patient Active Problem List   Diagnosis Date Noted   Genetic testing 05/06/2023   Neurofibroma 03/05/2023   Cavernous hemangioma 02/16/2022   Hearing loss 02/16/2022   Simple chronic bronchitis (HCC) 02/16/2022   Chronically dry eyes, bilateral 02/16/2022   Tear film insufficiency 02/16/2022   Vitamin D deficiency 02/16/2022   Recurrent infections 02/16/2022   Seasonal and perennial allergic rhinoconjunctivitis 12/24/2018   Asthma, well controlled 09/30/2017   Allergic rhinitis 12/31/2016   OSA (obstructive sleep apnea) 11/30/2016   Hypertension    Hyperlipidemia    GERD (gastroesophageal reflux disease)    Pre-diabetes    Past Surgical History:  Past Surgical History:  Procedure Laterality Date   ABDOMINAL HYSTERECTOMY  2000   TAH/BSO   CATARACT EXTRACTION Bilateral    IR ANGIO EXTERNAL CAROTID SEL EXT CAROTID BILAT MOD SED  10/02/2022   IR ANGIO INTRA EXTRACRAN SEL INTERNAL CAROTID BILAT MOD SED  10/02/2022   IR ANGIO VERTEBRAL SEL VERTEBRAL BILAT MOD SED  10/02/2022   WISDOM TOOTH EXTRACTION  1990's   Medications: Current Outpatient Medications on File Prior to Visit  Medication Sig Dispense Refill   amLODipine (NORVASC) 5 MG tablet Take 5 mg by mouth daily.     B Complex-C (SUPER B COMPLEX/VITAMIN C) TABS 1 tablet     Cholecalciferol (VITAMIN D-3) 5000 UNITS TABS Take 5,000 Units by mouth daily.     cycloSPORINE (RESTASIS) 0.05 % ophthalmic emulsion 1 drop into affected eye     famotidine (PEPCID) 40 MG tablet Take 1 tablet (40 mg total) by mouth at bedtime. 30 tablet 11   fluconazole (DIFLUCAN) 150 MG tablet Take 1 tablet (150 mg total) by mouth daily. 1 tablet 0   fluticasone (FLONASE) 50 MCG/ACT nasal spray Place 2 sprays into both nostrils daily. 16 g 11   MAGNESIUM CHLORIDE-CALCIUM PO Take by mouth daily. Reported on 05/04/2016     Omega-3 Fatty Acids (FISH OIL) 1000 MG CAPS 1 capsule     omeprazole  (PRILOSEC) 40 MG capsule Take 1 capsule (40 mg total) by mouth in the morning. 30 capsule 11   Varenicline Tartrate (TYRVAYA NA) Place into the nose in the morning and at bedtime.     No current facility-administered medications on file prior to visit.   Allergies:  Allergies  Allergen Reactions   Iodinated Contrast Media Hives and Swelling    Prior reaction to ivp contrast Patient pre-medicated w/ 13 hour prep prednisone and benadryl    Lipitor [Atorvastatin] Other (See Comments)    MUSCLE CRAMPS   Metformin And Related Diarrhea   Immunizations: Up to date  Review of Systems: Negative except as noted in the HPI/PMH  Family History  Problem Relation Age of Onset   Heart disease Mother    Hypertension Mother    Hyperlipidemia Mother    Heart disease Father    Hyperlipidemia Father    Hypertension Father    Atrial fibrillation Father    Cancer Brother    Hypertension Brother    Other Brother        benign tumor in neck   Hypertension Brother    Hearing loss Brother  due to tinnitus   Heart disease Maternal Uncle    Lung cancer Maternal Grandmother    Heart attack Paternal Grandfather    Breast cancer Neg Hx   Hearing loss: father at a later age Tinnitus: brother Please see the genetic counseling note for additional information.  Social History: Lives with her husband in [place]  Vitals: Weight: 159.5 lbs Height: 5'5.5" BP: 139/62 Pulse: 67  Genetics Physical Exam:  Constitution: The patient is active and alert  Head: No abnormalities detected in: head, hairline, shape or size    Anterior fontanelle flat: not flat    Anterior fontanelle open: not open    Bitemporal narrowing: forehead not narrow    Frontal bossing: no frontal bossing    Macrocephaly: not macrocephalic    Microcephaly: not microcephalic    Plagiocephaly: not plagiocephalic  Face: No abnormalities detected in: face, midface or shape    Coarse facial features: no coarse  facies    Midfacial hypoplasia: no midfacial hypoplasia  Eyes: No abnormalities detected in: eyes, eyebrows, irises, eyelashes, lids or pupils    Deep-set eyes: eyes not deep set    Downslanting palpebral fissure: no downslanting palpebral fissure    Epicanthus: no epicanthus inversus    Upslanting palpebral fissure: no upslanting palpebral fissure  Ears: No abnormalities detected in: ears    Low-set ears: ears not low set    Posteriorly rotated ears: ears not posteriorly rotated  Nose: No abnormalities detected in: nose, nasal bridge or nasal tip    Bulbous nasal tip: no prominent nasal tip    Columella below nares: no columella below nares    Depressed nasal bridge: no depressed nasal bridge    Flat nasal bridge: no flat nasal bridge    Hypoplastic alae nasi: nasal alae not underdeveloped     Upturned nasal tip: non-upturned nasal tip  Mouth: No abnormalities detected in: mouth, palate, lips, teeth or tongue    High-arched palate: palate not high arched    Micrognathia: no micrognathia    Smooth philtrum: non-smooth philtrum    Thin upper lip vermilion: non-thin upper lip vermilion Teeth:    Abnormal shape: normal morphology     Discolored: normal color     Misaligned: no misalignment of teeth   Neck: No abnormalities detected in: neck    Cysts: no cysts    Pits: no pits in neck    Redundant nuchal skin: no redundant neck skin    Webbing: no webbed neck  Chest: No abnormalities detected in: chest, appearance, clavicles or scapulae    Inverted nipples: nipples not inverted    Pectus excavatum: no pectus excavatum  Cardiac:    Irregular rate: heart rate regular    Irregular rhythm: regular rhythm    Murmur: murmur (comments: 2/6 SEM over RUSB)  Lungs: No abnormalities detected in: pulmonary system, bilateral auscultation or effort  Abdomen: No abnormalities detected in: abdomen or appearance    Abnormal umbilicus: normal umbilicus    Diastasis recti: no  diastasis recti    Distended abdomen: no distension    Hepatosplenomegaly: no hepatosplenomegaly    Umbilical hernia: no umbilical hernia  Spine: No abnormalities detected in: spine    Sacral anomalies: sacrum normal    Scoliosis: no scoliosis    Sacral dimple: no sacral dimple  Neurological: No abnormalities detected in: neurological system, deep tendon reflexes, antigravity movement of extremities, strength, facial movement or tone    Hypertonia: not hypertonic    Hypotonia: not hypotonic  Genitourinary:  not assessed  Hair, Nails, and Skin: (comments: Neurofibromas on left lower thigh, left hip, and 1 on left shoulder Firm nodule on right shoulder (likely not neurofibroma) No inguinal or axillary freckling, no CALs)  Extremities: No abnormalities detected in: extremities    Asymmetric girth: symmetric girth    Contractures: no joint contractures    Limited range of motion: non-limited ROM  Hands and Feet: No abnormalities detected in: distal extremities    Clinodactyly: no clinodactyly    Polydactyly: no polydactyly    Single palmar crease: multiple palmar creases    Syndactyly: no syndactyly   Photo of patient available (verbal consent obtained)   [place] Haldeman-Englert, MD [facility] Date: 08/07/2023 Time: 1500   Total time spent: 60 Time spent includes face to face and non-face to face care for the patient on the date of this encounter (history and physical, genetic counseling, coordination of care, data gathering and/or documentation as outlined).

## 2023-08-07 NOTE — Patient Instructions (Signed)
Results will be available in 1-2 months, depending on the insurance review.  Thank you for allowing [place] to be a part of your care. Please let us know if there is anything else you need from [place].  [facility] Team

## 2023-08-09 ENCOUNTER — Telehealth: Payer: Self-pay | Admitting: Genetic Counselor

## 2023-08-09 NOTE — Progress Notes (Signed)
Joanne Sanchez returned my phone call regarding insurance preference for genetic testing and confirmed her Medicare Advantage plan as her primary insurance.  Genetic testing order will be placed today.  [name], MS [facility] Certified Genetic Counselor

## 2023-08-09 NOTE — Progress Notes (Signed)
Attempted to call [name]. Left voicemail requesting a callback to discuss insurance preference for recent genetic testing.  [name], MS [facility] Certified Genetic Counselor

## 2023-08-13 DIAGNOSIS — D3610 Benign neoplasm of peripheral nerves and autonomic nervous system, unspecified: Secondary | ICD-10-CM | POA: Diagnosis not present

## 2023-08-13 DIAGNOSIS — D1800 Hemangioma unspecified site: Secondary | ICD-10-CM | POA: Diagnosis not present

## 2023-08-16 ENCOUNTER — Ambulatory Visit (INDEPENDENT_AMBULATORY_CARE_PROVIDER_SITE_OTHER): Payer: PPO | Admitting: *Deleted

## 2023-08-16 DIAGNOSIS — J309 Allergic rhinitis, unspecified: Secondary | ICD-10-CM

## 2023-08-22 DIAGNOSIS — H0288A Meibomian gland dysfunction right eye, upper and lower eyelids: Secondary | ICD-10-CM | POA: Diagnosis not present

## 2023-08-22 DIAGNOSIS — Z961 Presence of intraocular lens: Secondary | ICD-10-CM | POA: Diagnosis not present

## 2023-08-22 DIAGNOSIS — H40013 Open angle with borderline findings, low risk, bilateral: Secondary | ICD-10-CM | POA: Diagnosis not present

## 2023-08-22 DIAGNOSIS — Q8500 Neurofibromatosis, unspecified: Secondary | ICD-10-CM | POA: Diagnosis not present

## 2023-08-22 DIAGNOSIS — H0288B Meibomian gland dysfunction left eye, upper and lower eyelids: Secondary | ICD-10-CM | POA: Diagnosis not present

## 2023-08-22 DIAGNOSIS — H43813 Vitreous degeneration, bilateral: Secondary | ICD-10-CM | POA: Diagnosis not present

## 2023-09-03 ENCOUNTER — Telehealth: Payer: Self-pay | Admitting: Genetic Counselor

## 2023-09-03 NOTE — Progress Notes (Signed)
Spoke with Ms. Vonruden regarding the results of her recent genetic testing.  To review, Ms. Onley was seen due to a personal history of at least one biopsy-proven neurofibroma, cavernomas, hearing loss, and cataracts. She previously had genetic testing for hereditary cancer which was negative/normal including for Neurofibromatosis Type 1.    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  Though this result may have clinical applications for Ms. Biondolillo, it is not likely to be the cause of her current symptoms of neurofibroma, cavernoma, cataracts, and hearing loss. At this time, we have not identified a genetic cause for Ms. Hosman' symptoms.  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  They may benefit from increased from using moisturizers like frequent baths, petroleum jelly, and/or oral medications if symptoms are severe or skin infections develop.   No changes to medical management are recommended based on this result and no further follow-up with [facility] is required.  The results have been released to Ms. Suk and attached to the associated order.  [name], MS [facility] Certified Genetic Counselor

## 2023-09-10 DIAGNOSIS — M25562 Pain in left knee: Secondary | ICD-10-CM | POA: Diagnosis not present

## 2023-09-11 ENCOUNTER — Ambulatory Visit (INDEPENDENT_AMBULATORY_CARE_PROVIDER_SITE_OTHER): Payer: PPO | Admitting: *Deleted

## 2023-09-11 DIAGNOSIS — J309 Allergic rhinitis, unspecified: Secondary | ICD-10-CM

## 2023-09-12 NOTE — Progress Notes (Signed)
VIALS EXP 11-22-23

## 2023-09-13 DIAGNOSIS — J3081 Allergic rhinitis due to animal (cat) (dog) hair and dander: Secondary | ICD-10-CM | POA: Diagnosis not present

## 2023-09-16 DIAGNOSIS — J3089 Other allergic rhinitis: Secondary | ICD-10-CM | POA: Diagnosis not present

## 2023-09-22 ENCOUNTER — Encounter: Payer: Self-pay | Admitting: Family Medicine

## 2023-09-30 DIAGNOSIS — M25562 Pain in left knee: Secondary | ICD-10-CM | POA: Diagnosis not present

## 2023-10-07 DIAGNOSIS — M25562 Pain in left knee: Secondary | ICD-10-CM | POA: Diagnosis not present

## 2023-10-14 ENCOUNTER — Ambulatory Visit (INDEPENDENT_AMBULATORY_CARE_PROVIDER_SITE_OTHER): Payer: PPO

## 2023-10-14 DIAGNOSIS — J309 Allergic rhinitis, unspecified: Secondary | ICD-10-CM

## 2023-10-22 ENCOUNTER — Encounter: Payer: Self-pay | Admitting: Internal Medicine

## 2023-10-22 DIAGNOSIS — Q8500 Neurofibromatosis, unspecified: Secondary | ICD-10-CM | POA: Diagnosis not present

## 2023-10-22 DIAGNOSIS — H43813 Vitreous degeneration, bilateral: Secondary | ICD-10-CM | POA: Diagnosis not present

## 2023-11-14 ENCOUNTER — Ambulatory Visit (INDEPENDENT_AMBULATORY_CARE_PROVIDER_SITE_OTHER): Payer: PPO

## 2023-11-14 DIAGNOSIS — J309 Allergic rhinitis, unspecified: Secondary | ICD-10-CM

## 2023-11-19 ENCOUNTER — Other Ambulatory Visit: Payer: Self-pay | Admitting: Internal Medicine

## 2023-11-21 ENCOUNTER — Ambulatory Visit (INDEPENDENT_AMBULATORY_CARE_PROVIDER_SITE_OTHER): Payer: PPO

## 2023-11-21 DIAGNOSIS — J309 Allergic rhinitis, unspecified: Secondary | ICD-10-CM | POA: Diagnosis not present

## 2023-11-25 ENCOUNTER — Ambulatory Visit: Payer: PPO | Admitting: Allergy

## 2023-11-27 ENCOUNTER — Other Ambulatory Visit: Payer: Self-pay

## 2023-11-27 ENCOUNTER — Encounter: Payer: Self-pay | Admitting: Allergy

## 2023-11-27 ENCOUNTER — Ambulatory Visit: Payer: PPO | Admitting: Allergy

## 2023-11-27 VITALS — BP 118/82 | HR 64 | Temp 98.2°F | Resp 18 | Ht 64.25 in | Wt 159.6 lb

## 2023-11-27 DIAGNOSIS — J309 Allergic rhinitis, unspecified: Secondary | ICD-10-CM

## 2023-11-27 DIAGNOSIS — K219 Gastro-esophageal reflux disease without esophagitis: Secondary | ICD-10-CM | POA: Diagnosis not present

## 2023-11-27 DIAGNOSIS — J3089 Other allergic rhinitis: Secondary | ICD-10-CM

## 2023-11-27 DIAGNOSIS — J4520 Mild intermittent asthma, uncomplicated: Secondary | ICD-10-CM | POA: Diagnosis not present

## 2023-11-27 DIAGNOSIS — J302 Other seasonal allergic rhinitis: Secondary | ICD-10-CM | POA: Diagnosis not present

## 2023-11-27 MED ORDER — ALBUTEROL SULFATE HFA 108 (90 BASE) MCG/ACT IN AERS: 2.0000 | INHALATION_SPRAY | RESPIRATORY_TRACT | 1 refills | Status: DC | PRN

## 2023-11-27 MED ORDER — EPINEPHRINE 0.3 MG/0.3ML IJ SOAJ: 0.3000 mg | INTRAMUSCULAR | 1 refills | Status: AC | PRN
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
## 2023-11-27 NOTE — Patient Instructions (Addendum)
Asthma  Normal breathing test.  May use albuterol rescue inhaler 2 puffs every 4 to 6 hours as needed for shortness of breath, chest tightness, coughing, and wheezing. May use albuterol rescue inhaler 2 puffs 5 to 15 minutes prior to strenuous physical activities. Monitor frequency of use - if you need to use it more than twice per week on a consistent basis let us know.  Breathing control goals:  Full participation in all desired activities (may need albuterol before activity) Albuterol use two times or less a week on average (not counting use with activity) Cough interfering with sleep two times or less a month Oral steroids no more than once a year No hospitalizations   Rhinitis Continue allergy injections - given today.  Use over the counter antihistamines such as Zyrtec (cetirizine), Claritin (loratadine), Allegra (fexofenadine), or Xyzal (levocetirizine) daily as needed. May take twice a day during allergy flares. May switch antihistamines every few months. Use Flonase (fluticasone) nasal spray 1-2 sprays per nostril once a day as needed for nasal congestion.  Nasal saline spray (i.e., Simply Saline) or nasal saline lavage (i.e., NeilMed) is recommended as needed and prior to medicated nasal sprays.  Return for allergy skin testing to get updated allergy list. Make sure you don't take any antihistamines for 3 days before the skin testing appointment. Don't put any lotion on the back and arms on the day of testing.  Plan on being here for 30-60 minutes.   Reflux Continue lifestyle and dietary modifications. Continue omeprazole 40mg  once day as needed - nothing to eat or drink for 20-30 minutes afterwards.   For the pharmacy check out [facility]:

## 2023-12-03 NOTE — Progress Notes (Unsigned)
Skin testing note  RE: Joanne Sanchez MRN: [id number] DOB: August 16, 1956 Date of Office Visit: 12/04/2023  Referring provider: Myrlene Broker, * Primary care provider: Myrlene Broker, MD  Chief Complaint: No chief complaint on file.  History of Present Illness: I had the pleasure of seeing Joanne Sanchez for a skin testing visit at the [facility] on 12/04/2023. She is a 68 y.o. female, who is being followed for allergic rhinitis on AIT, asthma and GERD. Her previous allergy office visit was on 11/27/2023 with Dr. Selena Sanchez. Today is a skin testing visit.   Discussed the use of AI scribe software for clinical note transcription with the patient, who gave verbal consent to proceed.  The patient is currently undergoing allergy immunotherapy and reports a reduction in allergen sensitivity. She is allergic to grass mix, ragweed mix, dust mites, and dog.   She still experiences some symptoms, particularly due to spending a lot of time outdoors. She is open to adjusting her treatment plan, including potentially increasing the dose of allergens in her shots. Her current vial was mixed in November, and she is open to starting a new vial with adjusted allergen concentrations.  She is not experiencing significant symptoms at the moment and is comfortable with a gradual adjustment in her treatment. She has an Epipen, which was recently ordered.     Assessment and Plan: Joanne Sanchez is a 68 y.o. female with: Seasonal and perennial allergic rhinitis Other allergic rhinitis Past history - on long-term immunotherapy (allergy shots) with noted improvement in symptoms. Still experiences itching after shots. Has pets at home. Interim history - likes to be outdoors and dog still bothers her.  Today's skin testing positive to grass mix, ragweed mix, dust mites and dog.  Start environmental control measures as below. Use over the counter antihistamines such as Zyrtec (cetirizine), Claritin  (loratadine), Allegra (fexofenadine), or Xyzal (levocetirizine) daily as needed. May take twice a day during allergy flares. May switch antihistamines every few months. Will change the vial formulation to 1 injection but will increase some of the ingredients. Will have to do weekly build up for about 2 months depending on how well you tolerate it.  Make sure you pick up your epinephrine device.  Make a new vial allergy shot appointment for 2 weeks.    Mild intermittent asthma without complication May use albuterol rescue inhaler 2 puffs every 4 to 6 hours as needed for shortness of breath, chest tightness, coughing, and wheezing. May use albuterol rescue inhaler 2 puffs 5 to 15 minutes prior to strenuous physical activities. Monitor frequency of use - if you need to use it more than twice per week on a consistent basis let us know.    Gastroesophageal reflux disease, unspecified whether esophagitis present Continue lifestyle and dietary modifications. Continue omeprazole 40mg  once day as needed - nothing to eat or drink for 20-30 minutes afterwards.    Return in about 1 year (around 12/03/2024).  No orders of the defined types were placed in this encounter.  Lab Orders  No laboratory test(s) ordered today    Diagnostics: Skin Testing: Environmental allergy panel. Today's skin testing positive to grass mix, ragweed mix, dust mites and dog.  Results discussed with patient/family.  Airborne Adult Perc - 12/04/23 0918     Time Antigen Placed 4098    Allergen Manufacturer Waynette Buttery    Location Back    Number of Test 55    Panel 1 Select  1. Control-Buffer 50% Glycerol Negative    2. Control-Histamine 2+    3. Bahia Negative    4. French Southern Territories Negative    5. Johnson Negative    6. Kentucky Blue Negative    7. Meadow Fescue Negative    8. Perennial Rye Negative    9. Timothy Negative    10. Ragweed Mix Negative    11. Cocklebur Negative    12. Plantain,  English Negative    13.  Baccharis Negative    14. Dog Fennel Negative    15. Russian Thistle Negative    16. Lamb's Quarters Negative    17. Sheep Sorrell Negative    18. Rough Pigweed Negative    19. Marsh Elder, Rough Negative    20. Mugwort, Common Negative    21. Box, Elder Negative    22. Cedar, red Negative    23. Sweet Gum Negative    24. Pecan Pollen Negative    25. Pine Mix Negative    26. Walnut, Black Pollen Negative    27. Red Mulberry Negative    28. Ash Mix Negative    29. Birch Mix Negative    30. Beech American Negative    31. Cottonwood, Guinea-Bissau Negative    32. Hickory, White Negative    33. Maple Mix Negative    34. Oak, Guinea-Bissau Mix Negative    35. Sycamore Eastern Negative    36. Alternaria Alternata Negative    37. Cladosporium Herbarum Negative    38. Aspergillus Mix Negative    39. Penicillium Mix Negative    40. Bipolaris Sorokiniana (Helminthosporium) Negative    41. Drechslera Spicifera (Curvularia) Negative    42. Mucor Plumbeus Negative    43. Fusarium Moniliforme Negative    44. Aureobasidium Pullulans (pullulara) Negative    45. Rhizopus Oryzae Negative    46. Botrytis Cinera Negative    47. Epicoccum Nigrum Negative    48. Phoma Betae Negative    49. Dust Mite Mix 2+    50. Cat Hair 10,000 BAU/ml Negative    51.  Dog Epithelia Negative    52. Mixed Feathers Negative    53. Horse Epithelia Negative    54. Cockroach, German Negative    55. Tobacco Leaf Negative             Intradermal - 12/04/23 1019     Time Antigen Placed 1019    Allergen Manufacturer Greer    Location Arm    Number of Test 14    Control Negative    Bahia Negative    French Southern Territories Negative    Johnson Negative    7 Grass 2+    Ragweed Mix 2+    Weed Mix Negative    Tree Mix Negative    Mold 1 Negative    Mold 2 Negative    Mold 3 Negative    Mold 4 Negative    Cat Negative    Dog 2+    Cockroach Negative             Previous notes and tests were reviewed. The plan was  reviewed with the patient/family, and all questions/concerned were addressed.  It was my pleasure to see Joanne Sanchez today and participate in her care. Please feel free to contact me with any questions or concerns.  Sincerely,  Wyline Mood, DO Allergy & Immunology  [facility] office: [phone] [facility] office: [phone]

## 2023-12-04 ENCOUNTER — Encounter: Payer: Self-pay | Admitting: Allergy

## 2023-12-04 ENCOUNTER — Telehealth: Payer: Self-pay | Admitting: Allergy

## 2023-12-04 ENCOUNTER — Ambulatory Visit: Payer: PPO | Admitting: Allergy

## 2023-12-04 DIAGNOSIS — J4520 Mild intermittent asthma, uncomplicated: Secondary | ICD-10-CM

## 2023-12-04 DIAGNOSIS — J3089 Other allergic rhinitis: Secondary | ICD-10-CM

## 2023-12-04 DIAGNOSIS — K219 Gastro-esophageal reflux disease without esophagitis: Secondary | ICD-10-CM

## 2023-12-04 DIAGNOSIS — J302 Other seasonal allergic rhinitis: Secondary | ICD-10-CM

## 2023-12-04 NOTE — Patient Instructions (Addendum)
Today's skin testing positive to grass mix, ragweed mix, dust mites and dog.   Results given.  Environmental allergies Start environmental control measures as below. Use over the counter antihistamines such as Zyrtec (cetirizine), Claritin (loratadine), Allegra (fexofenadine), or Xyzal (levocetirizine) daily as needed. May take twice a day during allergy flares. May switch antihistamines every few months. Will change the vial formulation to 1 injection but will increase some of the ingredients. Will have to do weekly build up for about 2 months depending on how well you tolerate it.  Make sure you pick up your epinephrine device.   Asthma  May use albuterol rescue inhaler 2 puffs every 4 to 6 hours as needed for shortness of breath, chest tightness, coughing, and wheezing. May use albuterol rescue inhaler 2 puffs 5 to 15 minutes prior to strenuous physical activities. Monitor frequency of use - if you need to use it more than twice per week on a consistent basis let us know.  Breathing control goals:  Full participation in all desired activities (may need albuterol before activity) Albuterol use two times or less a week on average (not counting use with activity) Cough interfering with sleep two times or less a month Oral steroids no more than once a year No hospitalizations   Reflux Continue lifestyle and dietary modifications. Continue omeprazole 40mg  once day as needed - nothing to eat or drink for 20-30 minutes afterwards.   Follow up in 1 year or sooner if needed. Make a new vial allergy shot appointment for 2 weeks.   Reducing Pollen Exposure Pollen seasons: trees (spring), grass (summer) and ragweed/weeds (fall). Keep windows closed in your home and car to lower pollen exposure.  Install air conditioning in the bedroom and throughout the house if possible.  Avoid going out in dry windy days - especially early morning. Pollen counts are highest between 5 - 10 AM and on dry, hot  and windy days.  Save outside activities for late afternoon or after a heavy rain, when pollen levels are lower.  Avoid mowing of grass if you have grass pollen allergy. Be aware that pollen can also be transported indoors on people and pets.  Dry your clothes in an automatic dryer rather than hanging them outside where they might collect pollen.  Rinse hair and eyes before bedtime.  Control of House Dust Mite Allergen Dust mite allergens are a common trigger of allergy and asthma symptoms. While they can be found throughout the house, these microscopic creatures thrive in warm, humid environments such as bedding, upholstered furniture and carpeting. Because so much time is spent in the bedroom, it is essential to reduce mite levels there.  Encase pillows, mattresses, and box springs in special allergen-proof fabric covers or airtight, zippered plastic covers.  Bedding should be washed weekly in hot water (130 F) and dried in a hot dryer. Allergen-proof covers are available for comforters and pillows that can't be regularly washed.  Wash the allergy-proof covers every few months. Minimize clutter in the bedroom. Keep pets out of the bedroom.  Keep humidity less than 50% by using a dehumidifier or air conditioning. You can buy a humidity measuring device called a hygrometer to monitor this.  If possible, replace carpets with hardwood, linoleum, or washable area rugs. If that's not possible, vacuum frequently with a vacuum that has a HEPA filter. Remove all upholstered furniture and non-washable window drapes from the bedroom. Remove all non-washable stuffed toys from the bedroom.  Wash stuffed toys weekly.  Pet Allergen Avoidance: Contrary to popular opinion, there are no "hypoallergenic" breeds of dogs or cats. That is because people are not allergic to an animal's hair, but to an allergen found in the animal's saliva, dander (dead skin flakes) or urine. Pet allergy symptoms typically occur  within minutes. For some people, symptoms can build up and become most severe 8 to 12 hours after contact with the animal. People with severe allergies can experience reactions in public places if dander has been transported on the pet owners' clothing. Keeping an animal outdoors is only a partial solution, since homes with pets in the yard still have higher concentrations of animal allergens. Before getting a pet, ask your allergist to determine if you are allergic to animals. If your pet is already considered part of your family, try to minimize contact and keep the pet out of the bedroom and other rooms where you spend a great deal of time. As with dust mites, vacuum carpets often or replace carpet with a hardwood floor, tile or linoleum. High-efficiency particulate air (HEPA) cleaners can reduce allergen levels over time. While dander and saliva are the source of cat and dog allergens, urine is the source of allergens from rabbits, hamsters, mice and [place] pigs; so ask a non-allergic family member to clean the animal's cage. If you have a pet allergy, talk to your allergist about the potential for allergy immunotherapy (allergy shots). This strategy can often provide long-term relief.

## 2023-12-04 NOTE — Telephone Encounter (Signed)
PLEASE make following adjustment on patient's allergy vials.  We will stop her current vials - 2 shots (weed/dust mites & cat/dog/trees/grass).  New vials to be mixed G-Rw-Dm-D.  She will be on ONE shot only.   Special Instructions: build up once a week using schedule B starting on green vial. Once she reaches red 0.23mL go every 4 weeks.   Make sure patient waits 30 minutes after the first new injection from the new formulation.  Make sure patient has Epipen with her.  Thank you.

## 2023-12-05 DIAGNOSIS — J3089 Other allergic rhinitis: Secondary | ICD-10-CM | POA: Diagnosis not present

## 2023-12-05 NOTE — Progress Notes (Signed)
Aeroallergen Immunotherapy  Ordering Provider: Dr. Wyline Mood  Patient Details Name: MOMOKO SLEZAK MRN: [id number] Date of Birth: 01-Jul-1956  Order 1 of 1  Vial Label: G-RW-Dm-D  0.4 ml (Volume)  BAU Concentration -- 7 Grass Mix* 100,000 ([address] [place], [place], [place], Perennial Rye, RedTop, Sweet Vernal, Timothy) 0.4 ml (Volume)  1:20 Concentration -- Ragweed Mix 0.5 ml (Volume)  1:10 Concentration -- Dog Epithelia 0.6 ml (Volume)   AU Concentration -- Mite Mix (DF 5,000 & DP 5,000)   1.9  ml Extract Subtotal 3.1  ml Diluent 5.0  ml Maintenance Total  Schedule:  B Green Vial (1:1,000): Schedule B (6 doses) Red Vial (1:100): Schedule A (14 doses)  Special Instructions: build up once a week starting on green vial. Once she reached red 0.74mL go every 4 weeks.

## 2023-12-05 NOTE — Progress Notes (Signed)
VIALS EXP 12-04-24

## 2023-12-19 ENCOUNTER — Ambulatory Visit (INDEPENDENT_AMBULATORY_CARE_PROVIDER_SITE_OTHER): Payer: PPO

## 2023-12-19 DIAGNOSIS — J309 Allergic rhinitis, unspecified: Secondary | ICD-10-CM | POA: Diagnosis not present

## 2023-12-19 NOTE — Progress Notes (Signed)
Immunotherapy   Patient Details  Name: Joanne Sanchez MRN: [id number] Date of Birth: 1956-09-17  12/19/2023  Winfield Rast started injections for  grasses, ragweed's, dust mites, and dogs.  Following schedule: B  Frequency:1 time per week Epi-Pen:Epi-Pen Available  Consent signed previously and patient instructions given. Patient sat in room twenty seven for thirty minutes without an issue.    [name] 12/19/2023, 10:04 AM

## 2023-12-25 ENCOUNTER — Ambulatory Visit (INDEPENDENT_AMBULATORY_CARE_PROVIDER_SITE_OTHER): Payer: Self-pay | Admitting: *Deleted

## 2023-12-25 DIAGNOSIS — J309 Allergic rhinitis, unspecified: Secondary | ICD-10-CM | POA: Diagnosis not present

## 2024-01-01 ENCOUNTER — Ambulatory Visit (INDEPENDENT_AMBULATORY_CARE_PROVIDER_SITE_OTHER): Payer: Self-pay | Admitting: *Deleted

## 2024-01-01 DIAGNOSIS — J309 Allergic rhinitis, unspecified: Secondary | ICD-10-CM

## 2024-01-08 ENCOUNTER — Ambulatory Visit (INDEPENDENT_AMBULATORY_CARE_PROVIDER_SITE_OTHER): Payer: Self-pay | Admitting: *Deleted

## 2024-01-08 DIAGNOSIS — J309 Allergic rhinitis, unspecified: Secondary | ICD-10-CM | POA: Diagnosis not present

## 2024-01-10 ENCOUNTER — Telehealth: Payer: Self-pay

## 2024-01-10 DIAGNOSIS — Z1231 Encounter for screening mammogram for malignant neoplasm of breast: Secondary | ICD-10-CM | POA: Diagnosis not present

## 2024-01-10 LAB — HM MAMMOGRAPHY

## 2024-01-10 NOTE — Telephone Encounter (Signed)
 Copied from CRM [id number]. Topic: Clinical - Medical Advice >> Jan 10, 2024  2:38 PM Joanne Sanchez wrote: Reason for CRM: Patient would like Sanchez nurse call back in regard to when her last Covid vaccine was, and when her next one is due.

## 2024-01-15 ENCOUNTER — Ambulatory Visit (INDEPENDENT_AMBULATORY_CARE_PROVIDER_SITE_OTHER): Payer: Self-pay | Admitting: *Deleted

## 2024-01-15 DIAGNOSIS — J309 Allergic rhinitis, unspecified: Secondary | ICD-10-CM | POA: Diagnosis not present

## 2024-01-22 ENCOUNTER — Ambulatory Visit (INDEPENDENT_AMBULATORY_CARE_PROVIDER_SITE_OTHER): Payer: Self-pay | Admitting: *Deleted

## 2024-01-22 DIAGNOSIS — J309 Allergic rhinitis, unspecified: Secondary | ICD-10-CM

## 2024-01-29 ENCOUNTER — Ambulatory Visit (INDEPENDENT_AMBULATORY_CARE_PROVIDER_SITE_OTHER): Payer: Self-pay | Admitting: *Deleted

## 2024-01-29 DIAGNOSIS — J309 Allergic rhinitis, unspecified: Secondary | ICD-10-CM | POA: Diagnosis not present

## 2024-02-06 ENCOUNTER — Ambulatory Visit (INDEPENDENT_AMBULATORY_CARE_PROVIDER_SITE_OTHER): Payer: Self-pay

## 2024-02-06 DIAGNOSIS — J309 Allergic rhinitis, unspecified: Secondary | ICD-10-CM

## 2024-02-12 ENCOUNTER — Ambulatory Visit (INDEPENDENT_AMBULATORY_CARE_PROVIDER_SITE_OTHER): Payer: Self-pay

## 2024-02-12 DIAGNOSIS — J309 Allergic rhinitis, unspecified: Secondary | ICD-10-CM | POA: Diagnosis not present

## 2024-02-19 ENCOUNTER — Ambulatory Visit (INDEPENDENT_AMBULATORY_CARE_PROVIDER_SITE_OTHER): Payer: Self-pay

## 2024-02-19 DIAGNOSIS — J309 Allergic rhinitis, unspecified: Secondary | ICD-10-CM | POA: Diagnosis not present

## 2024-02-25 ENCOUNTER — Ambulatory Visit (INDEPENDENT_AMBULATORY_CARE_PROVIDER_SITE_OTHER)

## 2024-02-25 DIAGNOSIS — J309 Allergic rhinitis, unspecified: Secondary | ICD-10-CM

## 2024-03-03 ENCOUNTER — Ambulatory Visit (INDEPENDENT_AMBULATORY_CARE_PROVIDER_SITE_OTHER): Payer: Self-pay

## 2024-03-03 DIAGNOSIS — J309 Allergic rhinitis, unspecified: Secondary | ICD-10-CM

## 2024-03-10 ENCOUNTER — Ambulatory Visit (INDEPENDENT_AMBULATORY_CARE_PROVIDER_SITE_OTHER): Payer: Self-pay

## 2024-03-10 DIAGNOSIS — J309 Allergic rhinitis, unspecified: Secondary | ICD-10-CM

## 2024-03-17 ENCOUNTER — Ambulatory Visit (INDEPENDENT_AMBULATORY_CARE_PROVIDER_SITE_OTHER): Payer: Self-pay

## 2024-03-17 DIAGNOSIS — J309 Allergic rhinitis, unspecified: Secondary | ICD-10-CM | POA: Diagnosis not present

## 2024-03-25 ENCOUNTER — Ambulatory Visit (INDEPENDENT_AMBULATORY_CARE_PROVIDER_SITE_OTHER)

## 2024-03-25 DIAGNOSIS — J309 Allergic rhinitis, unspecified: Secondary | ICD-10-CM | POA: Diagnosis not present

## 2024-03-31 ENCOUNTER — Ambulatory Visit (INDEPENDENT_AMBULATORY_CARE_PROVIDER_SITE_OTHER): Payer: Self-pay

## 2024-03-31 DIAGNOSIS — J309 Allergic rhinitis, unspecified: Secondary | ICD-10-CM | POA: Diagnosis not present

## 2024-04-06 DIAGNOSIS — J3089 Other allergic rhinitis: Secondary | ICD-10-CM | POA: Diagnosis not present

## 2024-04-06 NOTE — Progress Notes (Signed)
 VIAL MADE 04-06-24

## 2024-04-13 DIAGNOSIS — M722 Plantar fascial fibromatosis: Secondary | ICD-10-CM | POA: Diagnosis not present

## 2024-04-29 ENCOUNTER — Ambulatory Visit (INDEPENDENT_AMBULATORY_CARE_PROVIDER_SITE_OTHER): Payer: Self-pay

## 2024-04-29 DIAGNOSIS — J309 Allergic rhinitis, unspecified: Secondary | ICD-10-CM

## 2024-05-04 DIAGNOSIS — M722 Plantar fascial fibromatosis: Secondary | ICD-10-CM | POA: Diagnosis not present

## 2024-05-11 ENCOUNTER — Telehealth: Payer: Self-pay

## 2024-05-11 NOTE — Telephone Encounter (Signed)
 Copied from CRM [id number]. Topic: Clinical - Request for Lab/Test Order >> May 11, 2024 11:01 AM Chiquita SQUIBB wrote: Reason for CRM: Patient is requesting blood testing to check if she has had the MMR vaccine, and also her annual blood work before her physical appointment.

## 2024-05-13 NOTE — Telephone Encounter (Signed)
**Note De-identified  [place]oolbright Obfuscation** Please advise 

## 2024-05-13 NOTE — Telephone Encounter (Signed)
 Given her age/date of birth she does not need vaccine or testing.

## 2024-05-19 ENCOUNTER — Ambulatory Visit: Admitting: Internal Medicine

## 2024-05-19 ENCOUNTER — Encounter: Payer: Self-pay | Admitting: Internal Medicine

## 2024-05-19 VITALS — BP 118/84 | HR 78 | Temp 97.9°F | Ht 64.25 in | Wt 162.0 lb

## 2024-05-19 DIAGNOSIS — J410 Simple chronic bronchitis: Secondary | ICD-10-CM | POA: Diagnosis not present

## 2024-05-19 DIAGNOSIS — Z0000 Encounter for general adult medical examination without abnormal findings: Secondary | ICD-10-CM | POA: Diagnosis not present

## 2024-05-19 DIAGNOSIS — M8589 Other specified disorders of bone density and structure, multiple sites: Secondary | ICD-10-CM

## 2024-05-19 DIAGNOSIS — E782 Mixed hyperlipidemia: Secondary | ICD-10-CM

## 2024-05-19 DIAGNOSIS — J4520 Mild intermittent asthma, uncomplicated: Secondary | ICD-10-CM | POA: Diagnosis not present

## 2024-05-19 DIAGNOSIS — R7303 Prediabetes: Secondary | ICD-10-CM

## 2024-05-19 DIAGNOSIS — I10 Essential (primary) hypertension: Secondary | ICD-10-CM | POA: Diagnosis not present

## 2024-05-19 LAB — COMPREHENSIVE METABOLIC PANEL WITH GFR
ALT: 19 U/L (ref 0–35)
AST: 19 U/L (ref 0–37)
Albumin: 4.7 g/dL (ref 3.5–5.2)
Alkaline Phosphatase: 91 U/L (ref 39–117)
BUN: 19 mg/dL (ref 6–23)
CO2: 28 meq/L (ref 19–32)
Calcium: 9.4 mg/dL (ref 8.4–10.5)
Chloride: 103 meq/L (ref 96–112)
Creatinine, Ser: 0.59 mg/dL (ref 0.40–1.20)
GFR: 91.67 mL/min (ref >60.00)
Glucose, Bld: 100 mg/dL — ABNORMAL HIGH (ref 70–99)
Potassium: 4.3 meq/L (ref 3.5–5.1)
Sodium: 138 meq/L (ref 135–145)
Total Bilirubin: 0.3 mg/dL (ref 0.2–1.2)
Total Protein: 7.3 g/dL (ref 6.0–8.3)

## 2024-05-19 LAB — LIPID PANEL
Cholesterol: 262 mg/dL — ABNORMAL HIGH (ref 0–200)
HDL: 58.5 mg/dL (ref >39.00)
LDL Cholesterol: 165 mg/dL — ABNORMAL HIGH (ref 0–99)
NonHDL: 203.75
Total CHOL/HDL Ratio: 4
Triglycerides: 192 mg/dL — ABNORMAL HIGH (ref 0.0–149.0)
VLDL: 38.4 mg/dL (ref 0.0–40.0)

## 2024-05-19 LAB — CBC
HCT: 40 % (ref 36.0–46.0)
Hemoglobin: 13.5 g/dL (ref 12.0–15.0)
MCHC: 33.6 g/dL (ref 30.0–36.0)
MCV: 92.4 fl (ref 78.0–100.0)
Platelets: 225 K/uL (ref 150.0–400.0)
RBC: 4.33 Mil/uL (ref 3.87–5.11)
RDW: 13 % (ref 11.5–15.5)
WBC: 10.3 K/uL (ref 4.0–10.5)

## 2024-05-19 LAB — HEMOGLOBIN A1C: Hgb A1c MFr Bld: 6.3 % (ref 4.6–6.5)

## 2024-05-19 LAB — VITAMIN D 25 HYDROXY (VIT D DEFICIENCY, FRACTURES): VITD: 57.8 ng/mL (ref 30.00–100.00)

## 2024-05-19 MED ORDER — AMLODIPINE BESYLATE 5 MG PO TABS: 5.0000 mg | ORAL_TABLET | Freq: Every day | ORAL | 3 refills | Status: DC

## 2024-05-19 NOTE — Progress Notes (Unsigned)
   Subjective:   Patient ID: Joanne Sanchez, female    DOB: [birth year], 68 y.o.   MRN: [id number]  HPI The patient is here for physical.  PMH, [facility], social history reviewed and updated  Review of Systems  Objective:  Physical Exam  Vitals:   05/19/24 1523  BP: 118/84  Pulse: 78  Temp: 97.9 F (36.6 C)  TempSrc: Oral  SpO2: 98%  Weight: 162 lb (73.5 kg)  Height: 5' 4.25 (1.632 m)    Assessment & Plan:

## 2024-05-20 ENCOUNTER — Ambulatory Visit: Payer: PPO | Admitting: Allergy

## 2024-05-20 ENCOUNTER — Other Ambulatory Visit: Payer: Self-pay

## 2024-05-20 ENCOUNTER — Encounter: Payer: Self-pay | Admitting: Internal Medicine

## 2024-05-20 ENCOUNTER — Encounter: Payer: Self-pay | Admitting: Allergy

## 2024-05-20 VITALS — BP 120/74 | HR 60 | Temp 98.1°F | Resp 18 | Ht 66.0 in | Wt 161.7 lb

## 2024-05-20 DIAGNOSIS — K219 Gastro-esophageal reflux disease without esophagitis: Secondary | ICD-10-CM | POA: Diagnosis not present

## 2024-05-20 DIAGNOSIS — J302 Other seasonal allergic rhinitis: Secondary | ICD-10-CM

## 2024-05-20 DIAGNOSIS — J4520 Mild intermittent asthma, uncomplicated: Secondary | ICD-10-CM | POA: Diagnosis not present

## 2024-05-20 DIAGNOSIS — Z0000 Encounter for general adult medical examination without abnormal findings: Secondary | ICD-10-CM | POA: Insufficient documentation

## 2024-05-20 DIAGNOSIS — J3089 Other allergic rhinitis: Secondary | ICD-10-CM | POA: Diagnosis not present

## 2024-05-20 NOTE — Assessment & Plan Note (Signed)
 Uses albuterol  and without flare. Continue.

## 2024-05-20 NOTE — Progress Notes (Deleted)
 Follow Up Note  RE: Joanne Sanchez MRN: [id number] DOB: January 09, 1956 Date of Office Visit: 05/20/2024  Referring provider: Rollene Almarie Sanchez, * Primary care provider: Rollene Almarie LABOR, MD  Chief Complaint: Follow-up (6 month follow up), Allergic Rhinitis  (Everything is going well no complaints. ), and Asthma (Being controlled.)  History of Present Illness: I had the pleasure of seeing Joanne Sanchez for a follow up visit at the [facility] on 05/20/2024. She is a 68 y.o. female, who is being followed for allergic rhinitis on AIT, asthma, GERD. Her previous allergy  office visit was on 12/04/2023 with Dr. Luke. Today is a regular follow up visit.  Discussed the use of AI scribe software for clinical note transcription with the patient, who gave verbal consent to proceed.  History of Present Illness   Her allergies are well-controlled with her current treatment regimen, which includes consolidated allergy  shots. She feels she is doing well on this treatment.  She reports that her asthma has been good and her breathing test in January was normal. She has not needed to use her inhaler frequently and has not required emergency care or prednisone  for asthma exacerbations.  She uses her reflux medication only as needed and has not reported any significant issues related to reflux.  She mentions a new diagnosis of plantar fasciitis and a heel spur, which have impacted her ability to exercise. She has not started any new medications for this condition.  Her EpiPen  was recently updated in January, and she confirms it is up to date.        12/19/2023   Joanne Sanchez started injections for  grasses, ragweed's, dust mites, and dogs.   Assessment and Plan: Joanne Sanchez is a 68 y.o. female with: Seasonal and perennial allergic rhinitis Other allergic rhinitis Past history - on long-term immunotherapy (allergy  shots) with noted improvement in symptoms. Still experiences  itching after shots. Has pets at home. Interim history - likes to be outdoors and dog still bothers her.  Today's skin testing positive to grass mix, ragweed mix, dust mites and dog.  Start environmental control measures as below. Use over the counter antihistamines such as Zyrtec (cetirizine), Claritin (loratadine), Allegra (fexofenadine), or Xyzal  (levocetirizine) daily as needed. May take twice a day during allergy  flares. May switch antihistamines every few months. Will change the vial formulation to 1 injection but will increase some of the ingredients. Will have to do weekly build up for about 2 months depending on how well you tolerate it.  Make sure you pick up your epinephrine  device.  Make a new vial allergy  shot appointment for 2 weeks.    Mild intermittent asthma without complication May use albuterol  rescue inhaler 2 puffs every 4 to 6 hours as needed for shortness of breath, chest tightness, coughing, and wheezing. May use albuterol  rescue inhaler 2 puffs 5 to 15 minutes prior to strenuous physical activities. Monitor frequency of use - if you need to use it more than twice per week on a consistent basis let us  know.    Gastroesophageal reflux disease, unspecified whether esophagitis present Continue lifestyle and dietary modifications. Continue omeprazole  40mg  once day as needed - nothing to eat or drink for 20-30 minutes afterwards. Assessment and Plan    Asthma Asthma is well-controlled with normal breathing tests and no exacerbations. - Send albuterol  prescription if needed after checking expiration date.  Plantar fasciitis and heel spur New diagnosis affecting exercise routine.  Gastroesophageal reflux disease (GERD)  GERD managed with as-needed medications without recent exacerbations.         Return in about 1 year (around 05/20/2025).  No orders of the defined types were placed in this encounter.  Lab Orders  No laboratory test(s) ordered today     Diagnostics: Spirometry:  Tracings reviewed. Her effort: Good reproducible efforts. FVC: 2.63L FEV1: 1.96L, 82% predicted FEV1/FVC ratio: 75% Interpretation: Spirometry consistent with normal pattern.  Please see scanned spirometry results for details.  Results discussed with patient/family.   Medication List:  Current Outpatient Medications  Medication Sig Dispense Refill   albuterol  (VENTOLIN  HFA) 108 (90 Base) MCG/ACT inhaler Inhale 2 puffs into the lungs every 4 (four) hours as needed for wheezing or shortness of breath (coughing fits). 18 g 1   amLODipine  (NORVASC ) 5 MG tablet Take 1 tablet (5 mg total) by mouth daily. 90 tablet 3   B Complex-C (SUPER B COMPLEX/VITAMIN C) TABS 1 tablet     Cholecalciferol (VITAMIN D -3) 5000 UNITS TABS Take 5,000 Units by mouth daily.     EPINEPHrine  0.3 mg/0.3 mL IJ SOAJ injection Inject 0.3 mg into the muscle as needed for anaphylaxis. 2 each 1   fluticasone  (FLONASE ) 50 MCG/ACT nasal spray Place 2 sprays into both nostrils daily. 16 g 11   MAGNESIUM CHLORIDE-CALCIUM PO Take by mouth daily. Reported on 05/04/2016     Multiple Vitamins-Minerals (WOMENS MULTI GUMMIES) CHEW      Omega-3 Fatty Acids (FISH OIL) 1000 MG CAPS 1 capsule     omeprazole  (PRILOSEC) 40 MG capsule Take 1 capsule (40 mg total) by mouth in the morning. 30 capsule 11   Varenicline Tartrate (TYRVAYA NA) Place into the nose in the morning and at bedtime.     No current facility-administered medications for this visit.   Allergies: Allergies  Allergen Reactions   Iodinated Contrast Media Hives and Swelling    Prior reaction to ivp contrast Patient pre-medicated w/ 13 hour prep prednisone  and benadryl    Lipitor [Atorvastatin] Other (See Comments)    MUSCLE CRAMPS   Metformin And Related Diarrhea   I reviewed her past medical history, social history, family history, and environmental history and no significant changes have been reported from her previous  visit.  Review of Systems  Constitutional:  Negative for appetite change, chills, fever and unexpected weight change.  HENT:  Negative for congestion and rhinorrhea.   Eyes:  Negative for itching.  Respiratory:  Negative for cough, chest tightness, shortness of breath and wheezing.   Cardiovascular:  Negative for chest pain.  Gastrointestinal:  Negative for abdominal pain.  Genitourinary:  Negative for difficulty urinating.  Skin:  Negative for rash.  Allergic/Immunologic: Positive for environmental allergies.  Neurological:  Negative for headaches.    Objective: BP 120/74 (BP Location: Left Arm, Patient Position: Sitting, Cuff Size: Normal)   Pulse 60   Temp 98.1 F (36.7 C) (Temporal)   Resp 18   Ht 5' 6 (1.676 m)   Wt 161 lb 11.2 oz (73.3 kg)   SpO2 95%   BMI 26.10 kg/m  Body mass index is 26.1 kg/m. Physical Exam Vitals and nursing note reviewed.  Constitutional:      Appearance: Normal appearance. She is well-developed.  HENT:     Head: Normocephalic and atraumatic.     Right Ear: Tympanic membrane and external ear normal.     Left Ear: Tympanic membrane and external ear normal.     Nose: Nose normal.     Mouth/Throat:  Mouth: Mucous membranes are moist.     Pharynx: Oropharynx is clear.  Eyes:     Conjunctiva/sclera: Conjunctivae normal.  Cardiovascular:     Rate and Rhythm: Normal rate and regular rhythm.     Heart sounds: Normal heart sounds. No murmur heard.    No friction rub. No gallop.  Pulmonary:     Effort: Pulmonary effort is normal.     Breath sounds: Normal breath sounds. No wheezing, rhonchi or rales.  Musculoskeletal:     Cervical back: Neck supple.  Skin:    General: Skin is warm.     Findings: No rash.  Neurological:     Mental Status: She is alert and oriented to person, place, and time.  Psychiatric:        Behavior: Behavior normal.    Previous notes and tests were reviewed. The plan was reviewed with the patient/family, and all  questions/concerned were addressed.  It was my pleasure to see Joanne Sanchez today and participate in her care. Please feel free to contact me with any questions or concerns.  Sincerely,  Orlan Cramp, DO Allergy  & Immunology  [facility]  [place] office: [phone] [facility] office: [phone]

## 2024-05-20 NOTE — Progress Notes (Signed)
 Follow Up Note  RE: Joanne Sanchez MRN: [id number] DOB: Mar 21, 1956 Date of Office Visit: 05/20/2024  Referring provider: Rollene Almarie LABOR, * Primary care provider: Rollene Almarie LABOR, MD  Chief Complaint: Follow-up (6 month follow up), Allergic Rhinitis  (Everything is going well no complaints. ), and Asthma (Being controlled.)  History of Present Illness: I had the pleasure of seeing [name] for a follow up visit at the [facility] on 05/20/2024. She is a 68 y.o. female, who is being followed for allergic rhinitis on AIT, asthma, GERD. Her previous allergy  office visit was on 12/04/2023 with Dr. Luke. Today is a regular follow up visit.  Discussed the use of AI scribe software for clinical note transcription with the patient, who gave verbal consent to proceed.   ROSANGELA FEHRENBACH is on immunotherapy for grasses, ragweeds, dust mites, and dogs. Her injection regimen was consolidated on 12/19/2023.  She states that she does not have any nasal complaints including congestion, rhinorrhea, or post-nasal drainage. The patient has some dry eyes, but does not attribute this to her allergies. She rarely uses nasal sprays and does not use an antihistamine except for prior to allergy  injections. [place] notices that her allergic symptoms have improved with immunotherapy and are limited to season changes, particularly the spring.  She enjoys hiking, aiming for about 4.5 miles each day when her plantar fasciitis is well. The patient does not have shortness of breath, coughing, but rarely has wheezing when exposed to outdoor allergens such as gardening which has only happened twice since January. During these times, she uses albuterol .  Occasionally she eats [place] food and finds that her reflux is noticeable. Due to the intermittent nature, [place] has only been using omeprazole  as needed, about two times during which she tends to follow 10 day period of once daily  usage.  Assessment and Plan: Joanne Sanchez is a 68 y.o. female with: Seasonal and perennial allergic rhinitis Past history - on long-term immunotherapy (allergy  shots) with noted improvement in symptoms. Still experiences itching after shots. Has pets at home. 2025 skin testing positive to grass mix, ragweed mix, dust mites and dog.  Interim history - new vials started on 12/19/2023 (G-[id number]) and doing well on it.  Continue environmental control measures.  Use over the counter antihistamines such as Zyrtec (cetirizine), Claritin (loratadine), Allegra (fexofenadine), or Xyzal  (levocetirizine) daily as needed. May take twice a day during allergy  flares. May switch antihistamines every few months. Continue allergy  injections.    Mild intermittent asthma without complication Well controlled with rare albuterol  use.  Normal spirometry today.  May use albuterol  rescue inhaler 2 puffs every 4 to 6 hours as needed for shortness of breath, chest tightness, coughing, and wheezing. May use albuterol  rescue inhaler 2 puffs 5 to 15 minutes prior to strenuous physical activities. Monitor frequency of use - if you need to use it more than twice per week on a consistent basis let us  know.    Gastroesophageal reflux disease, unspecified whether esophagitis present Managed with as-needed medications without recent exacerbations. Continue lifestyle and dietary modifications. Continue omeprazole  40mg  once day as needed - nothing to eat or drink for 20-30 minutes afterwards.  Return in about 1 year (around 05/20/2025).  No orders of the defined types were placed in this encounter.  Lab Orders  No laboratory test(s) ordered today    Diagnostics: Spirometry:  Tracings reviewed. Her effort: Good reproducible efforts. FVC: 2.63L FEV1: 1.96L, 82% predicted FEV1/FVC ratio: 75%  Interpretation: Spirometry consistent with normal pattern.  Please see scanned spirometry results for details.  Medication List:   Current Outpatient Medications  Medication Sig Dispense Refill   albuterol  (VENTOLIN  HFA) 108 (90 Base) MCG/ACT inhaler Inhale 2 puffs into the lungs every 4 (four) hours as needed for wheezing or shortness of breath (coughing fits). 18 g 1   amLODipine  (NORVASC ) 5 MG tablet Take 1 tablet (5 mg total) by mouth daily. 90 tablet 3   B Complex-C (SUPER B COMPLEX/VITAMIN C) TABS 1 tablet     Cholecalciferol (VITAMIN D -3) 5000 UNITS TABS Take 5,000 Units by mouth daily.     EPINEPHrine  0.3 mg/0.3 mL IJ SOAJ injection Inject 0.3 mg into the muscle as needed for anaphylaxis. 2 each 1   fluticasone  (FLONASE ) 50 MCG/ACT nasal spray Place 2 sprays into both nostrils daily. 16 g 11   MAGNESIUM CHLORIDE-CALCIUM PO Take by mouth daily. Reported on 05/04/2016     Multiple Vitamins-Minerals (WOMENS MULTI GUMMIES) CHEW      Omega-3 Fatty Acids (FISH OIL) 1000 MG CAPS 1 capsule     omeprazole  (PRILOSEC) 40 MG capsule Take 1 capsule (40 mg total) by mouth in the morning. 30 capsule 11   Varenicline Tartrate (TYRVAYA NA) Place into the nose in the morning and at bedtime.     No current facility-administered medications for this visit.   Allergies: Allergies  Allergen Reactions   Iodinated Contrast Media Hives and Swelling    Prior reaction to ivp contrast Patient pre-medicated w/ 13 hour prep prednisone  and benadryl    Lipitor [Atorvastatin] Other (See Comments)    MUSCLE CRAMPS   Metformin And Related Diarrhea   I reviewed her past medical history, social history, family history, and environmental history and no significant changes have been reported from her previous visit.  Review of Systems  Constitutional:  Negative for appetite change, chills, fever and unexpected weight change.  HENT:  Negative for congestion and rhinorrhea.   Eyes:  Negative for itching.  Respiratory:  Positive for wheezing (seldom, none at this time). Negative for cough, chest tightness and shortness of breath.    Cardiovascular:  Negative for chest pain.  Gastrointestinal:  Negative for abdominal pain.  Genitourinary:  Negative for difficulty urinating.  Skin:  Negative for rash.  Allergic/Immunologic: Positive for environmental allergies.  Neurological:  Negative for headaches.    Objective: BP 120/74 (BP Location: Left Arm, Patient Position: Sitting, Cuff Size: Normal)   Pulse 60   Temp 98.1 F (36.7 C) (Temporal)   Resp 18   Ht 5' 6 (1.676 m)   Wt 161 lb 11.2 oz (73.3 kg)   SpO2 95%   BMI 26.10 kg/m  Body mass index is 26.1 kg/m. Physical Exam Vitals and nursing note reviewed.  Constitutional:      Appearance: Normal appearance. She is well-developed.  HENT:     Head: Normocephalic and atraumatic.     Right Ear: External ear normal.     Left Ear: External ear normal.     Ears:     Comments: Hearing aids in place bilaterally    Nose: Nose normal.     Comments: Normal external appearance    Mouth/Throat:     Mouth: Mucous membranes are moist.     Pharynx: Oropharynx is clear.  Eyes:     Conjunctiva/sclera: Conjunctivae normal.  Cardiovascular:     Rate and Rhythm: Normal rate and regular rhythm.     Heart sounds: Murmur (systolic) heard.  No friction rub. No gallop.  Pulmonary:     Effort: Pulmonary effort is normal.     Breath sounds: Normal breath sounds. No wheezing, rhonchi or rales.  Musculoskeletal:     Cervical back: Neck supple.  Skin:    General: Skin is warm.     Findings: No rash.  Neurological:     Mental Status: She is alert and oriented to person, place, and time.  Psychiatric:        Behavior: Behavior normal.    Previous notes and tests were reviewed. The plan was reviewed with the patient/family, and all questions/concerned were addressed.  It was my pleasure to see Jillianne today and participate in her care. Please feel free to contact me with any questions or concerns.  Sincerely,  Donnice Mutter, MS4 [facility]  Orlan Cramp, DO Allergy  & Immunology  [facility]  [facility] office: [phone] [facility] office: [phone]

## 2024-05-20 NOTE — Assessment & Plan Note (Signed)
 BP at goal on amlodipine . Checking CBC and CMP and adjust as needed.

## 2024-05-20 NOTE — Assessment & Plan Note (Signed)
 Checking lipid panel and adjust omega 3 FA otc as needed.

## 2024-05-20 NOTE — Patient Instructions (Addendum)
 Environmental allergies 2025 skin testing positive to grass mix, ragweed mix, dust mites and dog.  Continue environmental control measures.  Use over the counter antihistamines such as Zyrtec (cetirizine), Claritin (loratadine), Allegra (fexofenadine), or Xyzal  (levocetirizine) daily as needed. May take twice a day during allergy  flares. May switch antihistamines every few months. Continue allergy  injections.   Asthma  Normal breathing test today.  May use albuterol  rescue inhaler 2 puffs every 4 to 6 hours as needed for shortness of breath, chest tightness, coughing, and wheezing. May use albuterol  rescue inhaler 2 puffs 5 to 15 minutes prior to strenuous physical activities. Monitor frequency of use - if you need to use it more than twice per week on a consistent basis let us  know.  Breathing control goals:  Full participation in all desired activities (may need albuterol  before activity) Albuterol  use two times or less a week on average (not counting use with activity) Cough interfering with sleep two times or less a month Oral steroids no more than once a year No hospitalizations   Reflux Continue lifestyle and dietary modifications. Continue omeprazole  40mg  once day as needed - nothing to eat or drink for 20-30 minutes afterwards.   Follow up in 1 year or sooner if needed.

## 2024-05-20 NOTE — Assessment & Plan Note (Signed)
 Flu shot yearly. Pneumonia complete. Shingrix complete. Tetanus counseled. Colonoscopy up to date. Mammogram up to date, pap smear aged out and dexa up to date. Counseled about sun safety and mole surveillance. Counseled about the dangers of distracted driving. Given 10 year screening recommendations.

## 2024-05-20 NOTE — Assessment & Plan Note (Signed)
 Checking HGA1c and adjust as needed.

## 2024-05-20 NOTE — Assessment & Plan Note (Signed)
 Uses albuterol  prn and no flare today. Continue.

## 2024-05-21 ENCOUNTER — Ambulatory Visit: Payer: Self-pay | Admitting: Internal Medicine

## 2024-05-21 DIAGNOSIS — E782 Mixed hyperlipidemia: Secondary | ICD-10-CM

## 2024-05-21 DIAGNOSIS — R7303 Prediabetes: Secondary | ICD-10-CM

## 2024-05-25 ENCOUNTER — Other Ambulatory Visit: Payer: Self-pay | Admitting: Family

## 2024-05-25 DIAGNOSIS — R7303 Prediabetes: Secondary | ICD-10-CM

## 2024-05-25 DIAGNOSIS — E782 Mixed hyperlipidemia: Secondary | ICD-10-CM

## 2024-05-27 ENCOUNTER — Ambulatory Visit (INDEPENDENT_AMBULATORY_CARE_PROVIDER_SITE_OTHER)

## 2024-05-27 DIAGNOSIS — J309 Allergic rhinitis, unspecified: Secondary | ICD-10-CM | POA: Diagnosis not present

## 2024-06-11 ENCOUNTER — Telehealth: Payer: Self-pay | Admitting: Internal Medicine

## 2024-06-11 NOTE — Telephone Encounter (Unsigned)
 Copied from CRM [id number]. Topic: Clinical - Medical Advice >> Jun 11, 2024 11:01 AM Gennette ORN wrote: Reason for CRM: Patient is calling because she wants to left a message with nurse. She has two questions about lab that was done for diabeties and she wants to get her lung and heart check she wants more information on what the physical should be done. She has concerns about her cholesterol as well does she need to take medication or more lab work as well. Her heart and lungs wasn't checked last physical. Please call back on [phone].

## 2024-06-11 NOTE — Telephone Encounter (Signed)
 Patient states that she would like to take cholesterol medicine and could that be sent in to her local pharmacy as well as in she had concerns about her physcial and states that her lungs were not checked with her having asthma and her ears weren't looked at as well patient just wanted is this normal

## 2024-06-12 MED ORDER — PRAVASTATIN SODIUM 20 MG PO TABS
20.0000 mg | ORAL_TABLET | Freq: Every day | ORAL | 3 refills | Status: DC
Start: 2024-06-12 — End: 2024-08-17

## 2024-06-12 NOTE — Telephone Encounter (Signed)
 Is she asking about me listening to her heart and lungs? This is very standard and if I forgot during her visit I do apologize. If she is asking about something else please let me know. Ears if having a problem we would look at specifically otherwise typically there is not a reason to check. We have sent in cholesterol medicine and follow up 3-6 months.

## 2024-06-12 NOTE — Telephone Encounter (Signed)
 Yes we was but I did inform her about the apology and she states that she would like to do her labs before she comes in to a follow up if this ok because she wants to fast before hand since she feels the recent lab were not accurate because she don't fast she is also wanting her sugars rechecked as well

## 2024-06-16 NOTE — Telephone Encounter (Signed)
 Addressed via mychart

## 2024-06-22 NOTE — Telephone Encounter (Unsigned)
 Copied from CRM #[id number]. Topic: Appointments - Appointment Scheduling >> Jun 22, 2024 11:03 AM Berneda FALCON wrote: Patient was told to come in for A1C Lab follow up, is this a fasting lab? Patient callback is [phone]

## 2024-06-22 NOTE — Telephone Encounter (Signed)
 Patient prefers to fast. OK to let her know she can

## 2024-06-22 NOTE — Telephone Encounter (Signed)
 For her labs prior to apt in the fall/winter? She is not due for labs now. Ok to fast if desired

## 2024-06-24 NOTE — Telephone Encounter (Signed)
 Called patient back and she verbalized she will fast before her labs that it her preference

## 2024-06-29 ENCOUNTER — Ambulatory Visit (INDEPENDENT_AMBULATORY_CARE_PROVIDER_SITE_OTHER): Payer: PPO

## 2024-06-29 VITALS — Ht 66.0 in | Wt 161.0 lb

## 2024-06-29 DIAGNOSIS — Z0000 Encounter for general adult medical examination without abnormal findings: Secondary | ICD-10-CM

## 2024-06-29 NOTE — Patient Instructions (Signed)
 Joanne Sanchez , Thank you for taking time out of your busy schedule to complete your Annual Wellness Visit with me. I enjoyed our conversation and look forward to speaking with you again next year. I, as well as your care team,  appreciate your ongoing commitment to your health goals. Please review the following plan we discussed and let me know if I can assist you in the future. Your Game plan/ To Do List    Follow up Visits: We will see or speak with you next year for your Next Medicare AWV with our clinical staff Have you seen your provider in the last 6 months (3 months if uncontrolled diabetes)? Yes.  Last office visit on 05/19/2024.  Clinician Recommendations:  Aim for 30 minutes of exercise or brisk walking, 6-8 glasses of water, and 5 servings of fruits and vegetables each day. You are due for a Tetanus vaccine, which you can get at ypur local pharmacy.  You are also due for a Hep C screening, which you will get during your next blood draw.        This is a list of the screenings recommended for you:  Health Maintenance  Topic Date Due   Hepatitis C Screening  Never done   DTaP/Tdap/Td vaccine (2 - Td or Tdap) 05/28/2023   COVID-19 Vaccine (7 - Mixed Product risk 2024-25 season) 01/08/2024   Medicare Annual Wellness Visit  06/25/2024   Flu Shot  06/05/2024   Colon Cancer Screening  03/21/2025   Mammogram  01/09/2026   Pneumococcal Vaccine for age over 19  Completed   DEXA scan (bone density measurement)  Completed   Zoster (Shingles) Vaccine  Completed   HPV Vaccine  Aged Out   Meningitis B Vaccine  Aged Out    Advanced directives: (Copy Requested) Please bring a copy of your health care power of attorney and living will to the office to be added to your chart at your convenience. You can mail to [facility] [address] [place], [place] [address] or email to [email or web address] Advance Care Planning is important because it:  [x]  Makes sure you receive  the medical care that is consistent with your values, goals, and preferences  [x]  It provides guidance to your family and loved ones and reduces their decisional burden about whether or not they are making the right decisions based on your wishes.  Follow the link provided in your after visit summary or read over the paperwork we have mailed to you to help you started getting your Advance Directives in place. If you need assistance in completing these, please reach out to us  so that we can help you!  See attachments for Preventive Care and Fall Prevention Tips.

## 2024-06-29 NOTE — Progress Notes (Signed)
 Subjective:   Joanne Sanchez is a 68 y.o. who presents for a Medicare Wellness preventive visit.  As a reminder, Annual Wellness Visits don't include a physical exam, and some assessments may be limited, especially if this visit is performed virtually. We may recommend an in-person follow-up visit with your provider if needed.  Visit Complete: Virtual I connected with  Joanne Sanchez on 06/29/24 by a audio enabled telemedicine application and verified that I am speaking with the correct person using two identifiers.  Patient Location: Home  Provider Location: Home Office  I discussed the limitations of evaluation and management by telemedicine. The patient expressed understanding and agreed to proceed.  Vital Signs: Because this visit was a virtual/telehealth visit, some criteria may be missing or patient reported. Any vitals not documented were not able to be obtained and vitals that have been documented are patient reported.  VideoDeclined- This patient declined Librarian, academic. Therefore the visit was completed with audio only.  Persons Participating in Visit: Patient.  AWV Questionnaire: No: Patient Medicare AWV questionnaire was not completed prior to this visit.  Cardiac Risk Factors include: advanced age (>50men, >15 women);hypertension;Other (see comment);dyslipidemia, Risk factor comments: OSA, Asthma     Objective:    Today's Vitals   06/29/24 1531  Weight: 161 lb (73 kg)  Height: 5' 6 (1.676 m)  PainSc: 6    Body mass index is 25.99 kg/m.     06/29/2024    3:44 PM 06/26/2023    3:45 PM 10/02/2022    9:04 AM 01/21/2018   10:14 PM  Advanced Directives  Does Patient Have a Medical Advance Directive? Yes Yes Yes No   Type of Estate agent of [place];Living will Healthcare Power of [place];Living will Living will   Does patient want to make changes to medical advance directive?   No - Patient declined   Copy  of Healthcare Power of Attorney in Chart? No - copy requested No - copy requested       Data saved with a previous flowsheet row definition    Current Medications (verified) Outpatient Encounter Medications as of 06/29/2024  Medication Sig   albuterol  (VENTOLIN  HFA) 108 (90 Base) MCG/ACT inhaler Inhale 2 puffs into the lungs every 4 (four) hours as needed for wheezing or shortness of breath (coughing fits).   amLODipine  (NORVASC ) 5 MG tablet Take 1 tablet (5 mg total) by mouth daily.   B Complex-C (SUPER B COMPLEX/VITAMIN C) TABS 1 tablet   Cholecalciferol (VITAMIN D -3) 5000 UNITS TABS Take 5,000 Units by mouth daily.   EPINEPHrine  0.3 mg/0.3 mL IJ SOAJ injection Inject 0.3 mg into the muscle as needed for anaphylaxis.   fluticasone  (FLONASE ) 50 MCG/ACT nasal spray Place 2 sprays into both nostrils daily.   MAGNESIUM CHLORIDE-CALCIUM PO Take by mouth daily. Reported on 05/04/2016   Multiple Vitamins-Minerals (WOMENS MULTI GUMMIES) CHEW    Omega-3 Fatty Acids (FISH OIL) 1000 MG CAPS 1 capsule   omeprazole  (PRILOSEC) 40 MG capsule Take 1 capsule (40 mg total) by mouth in the morning.   pravastatin  (PRAVACHOL ) 20 MG tablet Take 1 tablet (20 mg total) by mouth daily.   Varenicline Tartrate (TYRVAYA NA) Place into the nose in the morning and at bedtime.   No facility-administered encounter medications on file as of 06/29/2024.    Allergies (verified) Iodinated contrast media, Lipitor [atorvastatin], and Metformin and related   History: Past Medical History:  Diagnosis Date   Allergy   Anemia    Asthma    Cavernous angioma    Environmental allergies    GERD (gastroesophageal reflux disease)    Hernia, hiatal    Hyperlipidemia    Hypertension    Lumbar herniated disc    Plantar fasciitis    Pre-diabetes    Recurrent upper respiratory infection (URI)    Vertigo    Past Surgical History:  Procedure Laterality Date   ABDOMINAL HYSTERECTOMY  2000   TAH/BSO   CATARACT EXTRACTION  Bilateral    IR ANGIO EXTERNAL CAROTID SEL EXT CAROTID BILAT MOD SED  10/02/2022   IR ANGIO INTRA EXTRACRAN SEL INTERNAL CAROTID BILAT MOD SED  10/02/2022   IR ANGIO VERTEBRAL SEL VERTEBRAL BILAT MOD SED  10/02/2022   WISDOM TOOTH EXTRACTION  1990's   Family History  Problem Relation Age of Onset   Heart disease Mother    Hypertension Mother    Hyperlipidemia Mother    Hearing loss Father        in older age   Heart disease Father    Hyperlipidemia Father    Hypertension Father    Atrial fibrillation Father    Cancer Brother    Hypertension Brother    Other Brother        benign tumor in neck   Hypertension Brother    Hearing loss Brother 60       due to tinnitus   Heart disease Maternal Uncle    Lung cancer Maternal Grandmother    Heart attack Paternal Grandfather    Birth marks Daughter        large birthmark on hip and back   Breast cancer Neg Hx    Social History   Socioeconomic History   Marital status: Married    Spouse name: Charles   Number of children: 2   Years of education: Not on file   Highest education level: Associate degree: occupational, Scientist, product/process development, or vocational program  Occupational History   Occupation: unemployed  Tobacco Use   Smoking status: Never   Smokeless tobacco: Never  Vaping Use   Vaping status: Never Used  Substance and Sexual Activity   Alcohol use: Yes    Alcohol/week: 0.0 standard drinks of alcohol    Comment: occasionally   Drug use: No   Sexual activity: Not on file  Other Topics Concern   Not on file  Social History Narrative   Lives with husband/2025   Social Drivers of Health   Financial Resource Strain: Low Risk  (06/29/2024)   Overall Financial Resource Strain (CARDIA)    Difficulty of Paying Living Expenses: Not hard at all  Food Insecurity: No Food Insecurity (06/29/2024)   Hunger Vital Sign    Worried About Running Out of Food in the Last Year: Never true    Ran Out of Food in the Last Year: Never true   Transportation Needs: No Transportation Needs (06/29/2024)   PRAPARE - Administrator, Civil Service (Medical): No    Lack of Transportation (Non-Medical): No  Physical Activity: Insufficiently Active (06/29/2024)   Exercise Vital Sign    Days of Exercise per Week: 3 days    Minutes of Exercise per Session: 30 min  Stress: No Stress Concern Present (06/29/2024)   [employer] of Occupational Health - Occupational Stress Questionnaire    Feeling of Stress: Only a little  Recent Concern: Stress - Stress Concern Present (05/18/2024)   [employer] of Occupational Health - Occupational Stress Questionnaire  Feeling of Stress: To some extent  Social Connections: Socially Isolated (06/29/2024)   Social Connection and Isolation Panel    Frequency of Communication with Friends and Family: Once a week    Frequency of Social Gatherings with Friends and Family: Once a week    Attends Religious Services: Never    Database administrator or Organizations: No    Attends Engineer, structural: Never    Marital Status: Married    Tobacco Counseling Counseling given: Not Answered    Clinical Intake:  Pre-visit preparation completed: Yes  Pain : 0-10 Pain Score: 6  Pain Type: Chronic pain Pain Location: Foot Pain Orientation: Right Pain Radiating Towards: Nothing Pain Descriptors / Indicators: Aching, Discomfort Pain Onset: More than a month ago Pain Frequency: Intermittent Effect of Pain on Daily Activities: walking and exercise     BMI - recorded: 25.99 Nutritional Status: BMI 25 -29 Overweight Nutritional Risks: None Diabetes: No  Lab Results  Component Value Date   HGBA1C 6.3 05/19/2024   HGBA1C 6.0 03/04/2023   HGBA1C 6.3 (H) 09/23/2013     How often do you need to have someone help you when you read instructions, pamphlets, or other written materials from your doctor or pharmacy?: 1 - Never  Interpreter Needed?: No  Information entered by  :: Zadyn Yardley, RMA   Activities of Daily Living     06/29/2024    3:32 PM  In your present state of health, do you have any difficulty performing the following activities:  Hearing? 1  Comment wears hearing aides  Vision? 0  Difficulty concentrating or making decisions? 0  Walking or climbing stairs? 0  Dressing or bathing? 0  Doing errands, shopping? 0  Preparing Food and eating ? N  Using the Toilet? N  In the past six months, have you accidently leaked urine? N  Do you have problems with loss of bowel control? N  Managing your Medications? N  Managing your Finances? N  Housekeeping or managing your Housekeeping? N    Patient Care Team: Rollene Almarie LABOR, MD as PCP - General (Internal Medicine) Gust Penna, MD as Consulting Physician (Ophthalmology) Debarah Lorrene DEL., MD as Consulting Physician (Ophthalmology)  I have updated your Care Teams any recent Medical Services you may have received from other providers in the past year.     Assessment:   This is a routine wellness examination for Joanne Sanchez.  Hearing/Vision screen Hearing Screening - Comments:: wears hearing aides Vision Screening - Comments:: Need readers/ Dr. Octavia   Goals Addressed             This Visit's Progress    Client understands the importance of follow-up with providers by attending scheduled visits   On track      Depression Screen    06/29/2024    3:47 PM 05/19/2024    3:31 PM 06/26/2023    4:21 PM 06/26/2023    4:20 PM 05/28/2023    2:39 PM 04/29/2023    4:05 PM 03/04/2023    8:25 AM  PHQ 2/9 Scores  PHQ - 2 Score 0 0 0 0 0 0 0  PHQ- 9 Score 2  0 0 0      Fall Risk     06/29/2024    3:45 PM 05/19/2024    3:31 PM 07/25/2023    8:46 AM 06/26/2023    3:43 PM 05/28/2023    2:39 PM  Fall Risk   Falls in the past year?  0 0 0 0 0  Number falls in past yr: 0 0 0 0 0  Injury with Fall? 0 0 0 0 0  Risk for fall due to :    No Fall Risks   Follow up Falls evaluation completed;Falls  prevention discussed Falls evaluation completed Falls evaluation completed Falls prevention discussed Falls evaluation completed    MEDICARE RISK AT HOME:  Medicare Risk at Home Any stairs in or around the home?: No If so, are there any without handrails?: No Home free of loose throw rugs in walkways, pet beds, electrical cords, etc?: Yes Adequate lighting in your home to reduce risk of falls?: Yes Life alert?: No Use of a cane, walker or w/c?: No Grab bars in the bathroom?: Yes Shower chair or bench in shower?: Yes Elevated toilet seat or a handicapped toilet?: Yes  TIMED UP AND GO:  Was the test performed?  No  Cognitive Function: Declined/Normal: No cognitive concerns noted by patient or family. Patient alert, oriented, able to answer questions appropriately and recall recent events. No signs of memory loss or confusion.        06/26/2023    3:43 PM  6CIT Screen  What Year? 0 points  What month? 0 points  What time? 0 points  Count back from 20 0 points  Months in reverse 0 points  Repeat phrase 0 points  Total Score 0 points    Immunizations Immunization History  Administered Date(s) Administered   Influenza Split 07/28/2012, 09/23/2013, 09/05/2016   Influenza,inj,Quad PF,6+ Mos 09/16/2017, 10/31/2018, 08/23/2020   Influenza,inj,quad, With Preservative 10/31/2018, 07/21/2019   Influenza-Unspecified 07/11/2023   Moderna Sars-Covid-2 Vaccination 11/17/2019   PFIZER(Purple Top)SARS-COV-2 Vaccination 12/28/2019, 01/18/2020, 09/13/2020, 07/26/2021   PNEUMOCOCCAL CONJUGATE-20 05/28/2023   Pneumococcal Polysaccharide-23 11/05/2000   Tdap 05/27/2013   Unspecified SARS-COV-2 Vaccination 07/11/2023    Screening Tests Health Maintenance  Topic Date Due   Hepatitis C Screening  Never done   DTaP/Tdap/Td (2 - Td or Tdap) 05/28/2023   COVID-19 Vaccine (7 - Mixed Product risk 2024-25 season) 01/08/2024   INFLUENZA VACCINE  06/05/2024   Colonoscopy  03/21/2025   Medicare  Annual Wellness (AWV)  06/29/2025   MAMMOGRAM  01/09/2026   Pneumococcal Vaccine: 50+ Years  Completed   DEXA SCAN  Completed   Zoster Vaccines- Shingrix  Completed   HPV VACCINES  Aged Out   Meningococcal B Vaccine  Aged Out    Health Maintenance  Health Maintenance Due  Topic Date Due   Hepatitis C Screening  Never done   DTaP/Tdap/Td (2 - Td or Tdap) 05/28/2023   COVID-19 Vaccine (7 - Mixed Product risk 2024-25 season) 01/08/2024   INFLUENZA VACCINE  06/05/2024   Health Maintenance Items Addressed: See Nurse Notes at the end of this note  Additional Screening:  Vision Screening: Recommended annual ophthalmology exams for early detection of glaucoma and other disorders of the eye. Would you like a referral to an eye doctor? No    Dental Screening: Recommended annual dental exams for proper oral hygiene  Community Resource Referral / Chronic Care Management: CRR required this visit?  No   CCM required this visit?  No   Plan:    I have personally reviewed and noted the following in the patient's chart:   Medical and social history Use of alcohol, tobacco or illicit drugs  Current medications and supplements including opioid prescriptions. Patient is not currently taking opioid prescriptions. Functional ability and status Nutritional status Physical activity Advanced directives List  of other physicians Hospitalizations, surgeries, and ER visits in previous 12 months Vitals Screenings to include cognitive, depression, and falls Referrals and appointments  In addition, I have reviewed and discussed with patient certain preventive protocols, quality metrics, and best practice recommendations. A written personalized care plan for preventive services as well as general preventive health recommendations were provided to patient.   Rohail Klees L Dawan Farney, CMA   06/29/2024   After Visit Summary: (MyChart) Due to this being a telephonic visit, the after visit summary with  patients personalized plan was offered to patient via MyChart   Notes: Patient is due for a Tdap  She is due for a Hep C screening and can get that done during her next office visit.  Patient had no concerns to address today.

## 2024-07-01 ENCOUNTER — Encounter: Payer: Self-pay | Admitting: Internal Medicine

## 2024-07-13 ENCOUNTER — Telehealth: Payer: Self-pay

## 2024-07-13 NOTE — Telephone Encounter (Signed)
 Copied from CRM [id number]. Topic: General - Call Back - No Documentation >> Jul 13, 2024  2:49 PM Leah C wrote: Reason for CRM: Patient is calling for a prescription for a covid shot. [facility] told patient to call clinic.   [phone]  [id number] - [place], [place] - [address] AT [facility] [address] [place] [address] Phone: [phone] Fax: [phone]

## 2024-07-14 MED ORDER — COVID-19 MRNA VACC (MODERNA) 50 MCG/0.5ML IM SUSP: 0.5000 mL | Freq: Once | INTRAMUSCULAR | 0 refills | Status: AC

## 2024-07-14 NOTE — Telephone Encounter (Signed)
 Sent in

## 2024-07-23 ENCOUNTER — Ambulatory Visit (INDEPENDENT_AMBULATORY_CARE_PROVIDER_SITE_OTHER)

## 2024-07-23 ENCOUNTER — Ambulatory Visit: Payer: Self-pay

## 2024-07-23 ENCOUNTER — Encounter: Payer: Self-pay | Admitting: Internal Medicine

## 2024-07-23 DIAGNOSIS — J309 Allergic rhinitis, unspecified: Secondary | ICD-10-CM | POA: Diagnosis not present

## 2024-07-23 NOTE — Telephone Encounter (Signed)
 Copied from CRM [id number]. Topic: Clinical - Medication Question >> Jul 23, 2024  3:48 PM Mia F wrote: Reason for CRM: Pt is asking if she can switch from pravastatin  (PRAVACHOL ) 20 MG tablet to the other STATIN she was on. She says this new one costs more than she would like to pay.

## 2024-07-24 NOTE — Telephone Encounter (Signed)
**Note De-identified  [place]oolbright Obfuscation** Please advise 

## 2024-07-28 ENCOUNTER — Encounter: Payer: Self-pay | Admitting: Internal Medicine

## 2024-07-30 ENCOUNTER — Ambulatory Visit (INDEPENDENT_AMBULATORY_CARE_PROVIDER_SITE_OTHER)

## 2024-07-30 DIAGNOSIS — J309 Allergic rhinitis, unspecified: Secondary | ICD-10-CM

## 2024-08-06 ENCOUNTER — Ambulatory Visit

## 2024-08-06 DIAGNOSIS — J309 Allergic rhinitis, unspecified: Secondary | ICD-10-CM

## 2024-08-13 ENCOUNTER — Ambulatory Visit (INDEPENDENT_AMBULATORY_CARE_PROVIDER_SITE_OTHER)

## 2024-08-13 ENCOUNTER — Telehealth: Payer: Self-pay

## 2024-08-13 DIAGNOSIS — J309 Allergic rhinitis, unspecified: Secondary | ICD-10-CM | POA: Diagnosis not present

## 2024-08-13 NOTE — Telephone Encounter (Signed)
 Copied from CRM [id number]. Topic: Clinical - Prescription Issue >> Aug 13, 2024  2:46 PM Harlene ORN wrote: Reason for CRM: PCP wanted to know what previous statin drug the patient used to take It was rosuvastatin 5 mg once a day was last on it in 2023 and paid $10 for a 90 day prescription.

## 2024-08-17 MED ORDER — ROSUVASTATIN CALCIUM 5 MG PO TABS: 5.0000 mg | ORAL_TABLET | Freq: Every day | ORAL | 3 refills | Status: DC

## 2024-08-17 NOTE — Addendum Note (Signed)
 Addended by: ROLLENE NORRIS A on: 08/17/2024 11:11 AM   Modules accepted: Orders

## 2024-08-17 NOTE — Telephone Encounter (Signed)
 Called and informed pt this was sent in

## 2024-08-17 NOTE — Telephone Encounter (Signed)
 I have sent in the crestor to see if this is cheaper for her.

## 2024-08-19 ENCOUNTER — Telehealth: Payer: Self-pay

## 2024-08-19 NOTE — Telephone Encounter (Signed)
 Copied from CRM [id number]. Topic: Clinical - Prescription Issue >> Aug 13, 2024  2:46 PM Harlene ORN wrote: Reason for CRM: PCP wanted to know what previous statin drug the patient used to take It was rosuvastatin 5 mg once a day was last on it in 2023 and paid $10 for a 90 day prescription. >> Aug 19, 2024 10:34 AM [place] J wrote: Patient would like to speak with a medical assistant directly about her medication request. She is not able to thoroughly communicate how she's needing through MyChart.

## 2024-08-20 NOTE — Telephone Encounter (Signed)
 Im not sure patient medication was already sent in the rovustatin

## 2024-08-26 ENCOUNTER — Other Ambulatory Visit: Payer: Self-pay

## 2024-08-26 MED ORDER — AMLODIPINE BESYLATE 5 MG PO TABS: 5.0000 mg | ORAL_TABLET | Freq: Every day | ORAL | 3 refills | Status: AC

## 2024-08-26 NOTE — Telephone Encounter (Signed)
Refill has been sent in.  

## 2024-08-31 DIAGNOSIS — H0288A Meibomian gland dysfunction right eye, upper and lower eyelids: Secondary | ICD-10-CM | POA: Diagnosis not present

## 2024-08-31 DIAGNOSIS — Z961 Presence of intraocular lens: Secondary | ICD-10-CM | POA: Diagnosis not present

## 2024-08-31 DIAGNOSIS — H0288B Meibomian gland dysfunction left eye, upper and lower eyelids: Secondary | ICD-10-CM | POA: Diagnosis not present

## 2024-08-31 DIAGNOSIS — H40013 Open angle with borderline findings, low risk, bilateral: Secondary | ICD-10-CM | POA: Diagnosis not present

## 2024-08-31 DIAGNOSIS — Q8500 Neurofibromatosis, unspecified: Secondary | ICD-10-CM | POA: Diagnosis not present

## 2024-08-31 DIAGNOSIS — H43813 Vitreous degeneration, bilateral: Secondary | ICD-10-CM | POA: Diagnosis not present

## 2024-09-10 ENCOUNTER — Other Ambulatory Visit: Payer: Self-pay

## 2024-09-10 DIAGNOSIS — E782 Mixed hyperlipidemia: Secondary | ICD-10-CM

## 2024-09-10 MED ORDER — ROSUVASTATIN CALCIUM 5 MG PO TABS: 5.0000 mg | ORAL_TABLET | Freq: Every day | ORAL | 3 refills | Status: AC

## 2024-09-10 NOTE — Progress Notes (Addendum)
 Per pt request rx sent to [facility] pharmacy on [address]. Pt needed to r/s follow up visit and labs due to not having taken meds at the time it was recommended by Dr. Rollene (f/u in 3 months). Advised pt that future order was placed to have labs rechecked and is good until 8/26.

## 2024-09-11 NOTE — Telephone Encounter (Signed)
 Spoke with pt. Rescheduled f/u appt to 12/15/24@2pm .

## 2024-09-14 ENCOUNTER — Ambulatory Visit (INDEPENDENT_AMBULATORY_CARE_PROVIDER_SITE_OTHER)

## 2024-09-14 DIAGNOSIS — J309 Allergic rhinitis, unspecified: Secondary | ICD-10-CM | POA: Diagnosis not present

## 2024-09-23 DIAGNOSIS — M722 Plantar fascial fibromatosis: Secondary | ICD-10-CM | POA: Diagnosis not present

## 2024-10-13 ENCOUNTER — Ambulatory Visit

## 2024-10-13 DIAGNOSIS — J302 Other seasonal allergic rhinitis: Secondary | ICD-10-CM | POA: Diagnosis not present

## 2024-10-13 DIAGNOSIS — J309 Allergic rhinitis, unspecified: Secondary | ICD-10-CM

## 2024-10-23 ENCOUNTER — Ambulatory Visit: Admitting: Internal Medicine

## 2024-10-30 ENCOUNTER — Telehealth: Payer: Self-pay

## 2024-10-30 NOTE — Telephone Encounter (Signed)
 Copied from CRM [id number]. Topic: General - Other >> Oct 30, 2024  2:44 PM Alfonso HERO wrote: Reason for CRM: patient home covid test is positive and wants a call back or message on what she should do next

## 2024-11-06 ENCOUNTER — Ambulatory Visit: Payer: Self-pay

## 2024-11-06 NOTE — Telephone Encounter (Signed)
 Note: pt had requested call back on 10/30/24 and received MyChart message instead so she was not aware that a virtual was recommended. Having difficulty using MyChart. States she has the [employer] phone number and will call for future.  FYI Only or Action Required?: FYI only for provider: Home Care.  Patient was last seen in primary care on 05/19/2024 by Rollene Almarie LABOR, MD.  Called Nurse Triage reporting Advice Only.  Symptoms began several weeks ago.  Interventions attempted: OTC medications: nyquil, albuterol .  Symptoms are: gradually improving.  Triage Disposition: Home Care (overriding Call PCP Within 24 Hours)  Patient/caregiver understands and will follow disposition?: Yes  Reason for Disposition  [1] HIGH RISK patient (e.g., weak immune system, 65 years and older, obesity with BMI 30 or higher, pregnant, chronic lung disease) AND [2] COVID symptoms (e.g., cough, fever)  (Exceptions: Already seen by PCP and no new or worsening symptoms.)  Answer Assessment - Initial Assessment Questions Pt reports that she developed symptoms on 10/23/24 including headache, malaise, dry cough, body aches, mild SOB. Believes she had a low grade fever. Tested positive for covid on 10/27/24 and 11/03/24. States symptoms wane, worsening at night and improving during the day. States was experiencing wheezing at night that improved with her albuterol , wheezing has resolved x 2 nights. Took tylenol  for headaches that have resolved x 2-3 days. Is taking 10 mL of nyquil BID (1/3rd of a dose) and Neti Pot, is using saline and not tap water.   Patient was requesting advice for home care and current isolation protocols. Reviewed current CDC recommendations. Will call if she does not continue to improve. Advised ED if worsens over the weekend.  1. SYMPTOMS: What is your main symptom or concern? (e.g., cough, fever, shortness of breath, muscle aches)     See above 2. ONSET: When did the symptoms start?       10/23/24 3. COUGH: Do you have a cough? If Yes, ask: How bad is the cough?       Yes 4. FEVER: Do you have a fever? If Yes, ask: What is your temperature, how was it measured, and when did it start?     Denies  5. BREATHING DIFFICULTY: Are you having any difficulty breathing? (e.g., normal; shortness of breath, wheezing, unable to speak)      A little, not bad but I get that normally with exertion.  6. BETTER-SAME-WORSE: Are you getting better, staying the same or getting worse compared to yesterday?  If getting worse, ask, In what way?     Better 7. OTHER SYMPTOMS: Do you have any other symptoms?  (e.g., chills, fatigue, headache, loss of smell or taste, muscle pain, sore throat)     See above 8. COVID-19 DIAGNOSIS: How do you know that you have COVID? (e.g., positive lab test or self-test, diagnosed by doctor or NP/PA, symptoms after exposure).     Home test  Protocols used: COVID-19 - Diagnosed or Suspected-A-AH  Copied from CRM #[id number]. Topic: Clinical - Medical Advice >> Nov 06, 2024 12:24 PM Berneda FALCON wrote: Reason for CRM: Patient states she called in last week about her positive covid tests (done at home) and never got a call back about it. Upon looking, it shows the nurse recommended doing a VV with PCP to discuss options, but patient did not see this message in MyChart as she is having trouble with MyChart. She states this is why she asked for a callback directly. I asked her if she would  like to schedule a VV and she declined and states she just wants to speak to a nurse directly about what she can do.  Patient callback is [phone] (home) Past Medical History:  Diagnosis Date   Allergy     Anemia    Asthma    Cavernous angioma    Environmental allergies    GERD (gastroesophageal reflux disease)    Hernia, hiatal    Hyperlipidemia    Hypertension    Lumbar herniated disc    Plantar fasciitis    Pre-diabetes    Recurrent upper respiratory  infection (URI)    Vertigo

## 2024-11-09 ENCOUNTER — Telehealth: Payer: Self-pay | Admitting: Allergy

## 2024-11-09 DIAGNOSIS — J4520 Mild intermittent asthma, uncomplicated: Secondary | ICD-10-CM

## 2024-11-09 MED ORDER — ALBUTEROL SULFATE HFA 108 (90 BASE) MCG/ACT IN AERS: 2.0000 | INHALATION_SPRAY | RESPIRATORY_TRACT | 1 refills | Status: AC | PRN

## 2024-11-09 NOTE — Telephone Encounter (Signed)
 Pt called requesting a prescription of albuterol  (VENTOLIN  HFA) 108 (90 Base) MCG/ACT inhaler [[id number]] sent to the [facility] on [address] [place] that it was suppose to be called in.

## 2024-11-13 ENCOUNTER — Ambulatory Visit

## 2024-11-13 DIAGNOSIS — J302 Other seasonal allergic rhinitis: Secondary | ICD-10-CM

## 2024-11-13 NOTE — Telephone Encounter (Signed)
 Placed f/u call to pt to see if she continues to improve. LVM for pt to call office if she has any concerns.

## 2024-12-02 ENCOUNTER — Ambulatory Visit: Payer: PPO | Admitting: Allergy

## 2024-12-11 ENCOUNTER — Ambulatory Visit (INDEPENDENT_AMBULATORY_CARE_PROVIDER_SITE_OTHER)

## 2024-12-11 DIAGNOSIS — J302 Other seasonal allergic rhinitis: Secondary | ICD-10-CM

## 2024-12-15 ENCOUNTER — Ambulatory Visit: Admitting: Internal Medicine

## 2025-05-19 ENCOUNTER — Ambulatory Visit: Admitting: Allergy
